# Patient Record
Sex: Female | Born: 1955 | Hispanic: Yes | Marital: Married | Smoking: Never smoker
Health system: Southern US, Community
[De-identification: ages and names within clinical notes are randomized; demographics above are authoritative.]

## PROBLEM LIST (undated history)

## (undated) DIAGNOSIS — J383 Other diseases of vocal cords: Secondary | ICD-10-CM

## (undated) DIAGNOSIS — R1310 Dysphagia, unspecified: Secondary | ICD-10-CM

## (undated) DIAGNOSIS — K5900 Constipation, unspecified: Secondary | ICD-10-CM

## (undated) DIAGNOSIS — T7840XA Allergy, unspecified, initial encounter: Secondary | ICD-10-CM

## (undated) DIAGNOSIS — S069XAA Unspecified intracranial injury with loss of consciousness status unknown, initial encounter: Secondary | ICD-10-CM

## (undated) DIAGNOSIS — S069X9A Unspecified intracranial injury with loss of consciousness of unspecified duration, initial encounter: Secondary | ICD-10-CM

## (undated) DIAGNOSIS — E079 Disorder of thyroid, unspecified: Secondary | ICD-10-CM

## (undated) DIAGNOSIS — E7800 Pure hypercholesterolemia, unspecified: Secondary | ICD-10-CM

## (undated) DIAGNOSIS — K219 Gastro-esophageal reflux disease without esophagitis: Secondary | ICD-10-CM

## (undated) DIAGNOSIS — I639 Cerebral infarction, unspecified: Secondary | ICD-10-CM

## (undated) DIAGNOSIS — R569 Unspecified convulsions: Secondary | ICD-10-CM

## (undated) DIAGNOSIS — H409 Unspecified glaucoma: Secondary | ICD-10-CM

## (undated) DIAGNOSIS — F419 Anxiety disorder, unspecified: Secondary | ICD-10-CM

## (undated) DIAGNOSIS — I10 Essential (primary) hypertension: Secondary | ICD-10-CM

## (undated) HISTORY — PX: REMOVAL OF GASTROSTOMY TUBE: SHX6058

## (undated) HISTORY — DX: Gastro-esophageal reflux disease without esophagitis: K21.9

## (undated) HISTORY — DX: Unspecified glaucoma: H40.9

## (undated) HISTORY — DX: Disorder of thyroid, unspecified: E07.9

## (undated) HISTORY — DX: Constipation, unspecified: K59.00

## (undated) HISTORY — DX: Unspecified convulsions: R56.9

## (undated) HISTORY — DX: Anxiety disorder, unspecified: F41.9

## (undated) HISTORY — DX: Allergy, unspecified, initial encounter: T78.40XA

## (undated) HISTORY — PX: GASTROSTOMY TUBE PLACEMENT: SHX655

---

## 2012-05-24 ENCOUNTER — Emergency Department (HOSPITAL_COMMUNITY)
Admission: EM | Admit: 2012-05-24 | Discharge: 2012-05-24 | Disposition: A | Discharge status: Home / Self Care | Payer: Self-pay | Attending: Emergency Medicine | Admitting: Emergency Medicine

## 2012-05-24 ENCOUNTER — Encounter (HOSPITAL_COMMUNITY): Payer: Self-pay | Admitting: Emergency Medicine

## 2012-05-24 DIAGNOSIS — E7800 Pure hypercholesterolemia, unspecified: Secondary | ICD-10-CM | POA: Insufficient documentation

## 2012-05-24 DIAGNOSIS — I10 Essential (primary) hypertension: Secondary | ICD-10-CM | POA: Insufficient documentation

## 2012-05-24 DIAGNOSIS — F419 Anxiety disorder, unspecified: Secondary | ICD-10-CM

## 2012-05-24 DIAGNOSIS — F411 Generalized anxiety disorder: Secondary | ICD-10-CM | POA: Insufficient documentation

## 2012-05-24 DIAGNOSIS — E119 Type 2 diabetes mellitus without complications: Secondary | ICD-10-CM | POA: Insufficient documentation

## 2012-05-24 HISTORY — DX: Pure hypercholesterolemia, unspecified: E78.00

## 2012-05-24 HISTORY — DX: Essential (primary) hypertension: I10

## 2012-05-24 MED ORDER — CLONAZEPAM 0.5 MG PO TABS: 0.5000 mg | ORAL_TABLET | Freq: Two times a day (BID) | ORAL | Status: DC | PRN

## 2012-05-24 MED ORDER — LORAZEPAM 1 MG PO TABS
1.0000 mg | ORAL_TABLET | Freq: Once | ORAL | Status: AC
  Administered 2012-05-24: 1 mg via ORAL
  Filled 2012-05-24: qty 2

## 2012-05-24 MED ORDER — LORAZEPAM 2 MG/ML IJ SOLN: 1.0000 mg | Freq: Once | INTRAMUSCULAR | Status: DC

## 2012-05-24 NOTE — ED Provider Notes (Signed)
History     CSN: [id number]  Arrival date & time 05/24/12  2011   First MD Initiated Contact with Patient 05/24/12 2028      Chief Complaint  Patient presents with  . Panic Attack    (Consider location/radiation/quality/duration/timing/severity/associated sxs/prior treatment) HPI Comments: Patient with a history of hypertension, diabetes mellitus, and hyperlipidemia presents emergency department with a chief complaint of anxiety attacks.  She states that she has been diagnosed back in [place] with panic and anxiety attacks but has not been followed up here in regards to this diagnosis.  She states that she has been experiencing increasing episodes over the last 3 months and describes them as lasting less than 1 minute, associated with heart palpitations, sweating palms, dizziness, and temporary shortness of breath.  She had an episode early today that lasted about 15 seconds.  Patient denies any chest pain, diaphoresis, syncope, difficulty ambulating including ataxia or disequilibrium, family history of heart attacks, persistent shortness of breath, exertional symptoms, fever, night sweats, chills, cough, hemoptysis.  The history is provided by the patient. The history is limited by a language barrier. A language interpreter was used Publishing rights manager).    Past Medical History  Diagnosis Date  . Hypertension   . Diabetes mellitus   . Hypercholesteremia     No past surgical history on file.  No family history on file.  History  Substance Use Topics  . Smoking status: Not on file  . Smokeless tobacco: Not on file  . Alcohol Use:     OB History    Grav Para Term Preterm Abortions TAB SAB Ect Mult Living                  Review of Systems  All other systems reviewed and are negative.    Allergies  Review of patient's allergies indicates no known allergies.  Home Medications   Current Outpatient Rx  Name Route Sig Dispense Refill  . ENALAPRIL MALEATE 10 MG PO TABS Oral  Take 10 mg by mouth daily.      BP 137/70  Pulse 79  Temp 98.3 F (36.8 C) (Oral)  Resp 18  SpO2 97%  Physical Exam  Nursing note and vitals reviewed. Constitutional: She appears well-developed and well-nourished. No distress.  HENT:  Head: Normocephalic and atraumatic.  Eyes: Conjunctivae and EOM are normal. Pupils are equal, round, and reactive to light.  Neck: Normal range of motion. Neck supple. Normal carotid pulses and no JVD present. Carotid bruit is not present. No rigidity. Normal range of motion present.  Cardiovascular: Normal rate, regular rhythm, S1 normal, S2 normal, normal heart sounds, intact distal pulses and normal pulses.  Exam reveals no gallop and no friction rub.   No murmur heard.      No pitting edema bilaterally, RRR, no aberrant sounds on auscultations, distal pulses intact, no carotid bruit or JVD.   Pulmonary/Chest: Effort normal and breath sounds normal. No accessory muscle usage or stridor. No respiratory distress. She exhibits no tenderness and no bony tenderness.  Abdominal: Bowel sounds are normal.       Soft non tender. Non pulsatile aorta.   Skin: Skin is warm, dry and intact. No rash noted. She is not diaphoretic. No cyanosis. Nails show no clubbing.    ED Course  Procedures (including critical care time)  Labs Reviewed - No data to display No results found.   No diagnosis found.   Date: 05/24/2012  Rate: 79  Rhythm: normal sinus rhythm  QRS Axis: normal  Intervals: normal  ST/T Wave abnormalities: normal  Conduction Disutrbances: none  Narrative Interpretation:   Old EKG Reviewed: No significant changes noted   MDM  Anxiety  Patient presentation consistent with anxiety attack.  Patient denies having any chest pain, exertional symptoms, EKG reviewed and normal, vital signs normal as well. Discussed case with Dr.Wentz, that agrees her presentation is not concerning for cardiac etiology and further workup is not indicated at this  time.  Patient will be discharged for short course of benzodiazepine and recommendation to followup with psychiatry.  Due to patient's comorbidities it is also recommended that she follow up and establish a relationship with her primary care physician.  She verbalizes understanding and states she will do this.        Jaci Carrel, [place] 05/24/12 2120

## 2012-05-24 NOTE — ED Notes (Addendum)
SOB reported for two months intermittent. She believes it to be anxiety because she feels nervous before it occurs. She reports the episodes only last for a minute. She says sometimes she has them daily and sometimes she will go several days without it occuring. Had panic attack today so wanted to come it. She says she gets more anxious due to fact she feels like she cannot breathe. She says MD in [place] dx her with anxiety but wanted to come in just to be sure. Denies chest pain.

## 2012-05-26 NOTE — ED Provider Notes (Signed)
Medical screening examination/treatment/procedure(s) were performed by non-physician practitioner and as supervising physician I was immediately available for consultation/collaboration.  Flint Melter, MD 05/26/12 [phone]

## 2014-02-16 ENCOUNTER — Ambulatory Visit (INDEPENDENT_AMBULATORY_CARE_PROVIDER_SITE_OTHER): Payer: Medicaid Other | Admitting: Family Medicine

## 2014-02-16 ENCOUNTER — Encounter: Payer: Self-pay | Admitting: Family Medicine

## 2014-02-16 VITALS — BP 125/80 | HR 92 | Temp 98.3°F | Ht 61.61 in | Wt 134.0 lb

## 2014-02-16 DIAGNOSIS — E785 Hyperlipidemia, unspecified: Secondary | ICD-10-CM | POA: Insufficient documentation

## 2014-02-16 DIAGNOSIS — I69920 Aphasia following unspecified cerebrovascular disease: Secondary | ICD-10-CM

## 2014-02-16 DIAGNOSIS — IMO0002 Reserved for concepts with insufficient information to code with codable children: Secondary | ICD-10-CM

## 2014-02-16 DIAGNOSIS — G839 Paralytic syndrome, unspecified: Secondary | ICD-10-CM | POA: Insufficient documentation

## 2014-02-16 DIAGNOSIS — Z931 Gastrostomy status: Secondary | ICD-10-CM

## 2014-02-16 DIAGNOSIS — I619 Nontraumatic intracerebral hemorrhage, unspecified: Secondary | ICD-10-CM | POA: Insufficient documentation

## 2014-02-16 DIAGNOSIS — S069X9A Unspecified intracranial injury with loss of consciousness of unspecified duration, initial encounter: Secondary | ICD-10-CM

## 2014-02-16 DIAGNOSIS — I10 Essential (primary) hypertension: Secondary | ICD-10-CM | POA: Insufficient documentation

## 2014-02-16 DIAGNOSIS — I69320 Aphasia following cerebral infarction: Secondary | ICD-10-CM | POA: Insufficient documentation

## 2014-02-16 DIAGNOSIS — E119 Type 2 diabetes mellitus without complications: Secondary | ICD-10-CM | POA: Insufficient documentation

## 2014-02-16 LAB — LIPID PANEL
CHOL/HDL RATIO: 4.7 ratio
Cholesterol: 207 mg/dL — ABNORMAL HIGH (ref 0–200)
HDL: 44 mg/dL (ref >39)
TRIGLYCERIDES: 424 mg/dL — AB (ref <150)

## 2014-02-16 LAB — BASIC METABOLIC PANEL
BUN: 12 mg/dL (ref 6–23)
CHLORIDE: 102 meq/L (ref 96–112)
CO2: 28 meq/L (ref 19–32)
CREATININE: 0.54 mg/dL (ref 0.50–1.10)
Calcium: 9.6 mg/dL (ref 8.4–10.5)
Glucose, Bld: 80 mg/dL (ref 70–99)
POTASSIUM: 4.3 meq/L (ref 3.5–5.3)
SODIUM: 139 meq/L (ref 135–145)

## 2014-02-16 LAB — POCT GLYCOSYLATED HEMOGLOBIN (HGB A1C): HEMOGLOBIN A1C: 6.2

## 2014-02-16 NOTE — Patient Instructions (Signed)
Muchas gracias por venir hoy.   Vamos a hacer todas las referencias y llamarles con las citas.  Por favor, hace otra cita conmigo en dos o tres semanas.  Mucho gusto de conocerles.

## 2014-02-16 NOTE — Progress Notes (Signed)
   Subjective:    Patient ID: Amy [name], female    DOB: 1956-01-25, 58 y.o.   MRN: [id number]  HPI Amy [name] presents to establish care.   She has a past medical history of diabetes, hypertension and hyperlipidemia.  Several month ago she fell while visiting family in [place]. 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 She was in a coma requiring mechanical ventilation for several months and her doctors did not think she would survive or regain any function. She was g-tube fed. With aggressive PT and SLP she regained some function and can now move her left arm and eat normally but remains intolerant to liquids. The patient is non-verbal except when startled but communicates somewhat through gestures. The family also reports memory loss and frequent episodes of crying when interacting with healthcare professionals but reports that she is generally happy at home.  Her family decided that she would be better served returning to [place] and need to establish care for her ongoing medical problems. Her craniotomy site remains unprotected because the bone piece was out too long to be replaced.   She comes to me on only Keppra and Glyburide. The family reports the Keppra was for secondary stroke prevention and deny any history of seizure. Presumably there was some seizure activity following her brain injury for which this was prescribed.  They are requesting referrals to establish care with the following services.  Neurology - management of TBI and AED Neurosurgery - management/follow-up of craniotomy General surgery - management of g-tube Physical therapy Speech therapy  The visit was conducted in [place] with occasional assistance from an interpreter who was present throughout.  Review of Systems  Neurological: Positive for speech difficulty and weakness. Negative for seizures.  Psychiatric/Behavioral: Negative for dysphoric mood and agitation.  All other  systems reviewed and are negative.      Objective:   Physical Exam  Nursing note and vitals reviewed. Constitutional: She appears well-developed and well-nourished. No distress.  HENT:  Head: Normocephalic and atraumatic.  Eyes: Conjunctivae are normal. Right eye exhibits no discharge. Left eye exhibits no discharge. No scleral icterus.  Cardiovascular: Normal rate, regular rhythm, normal heart sounds and intact distal pulses.   No murmur heard. Pulmonary/Chest: Effort normal and breath sounds normal. No respiratory distress. She has no wheezes.  Abdominal: Soft. Bowel sounds are normal. She exhibits no distension and no mass. There is no tenderness. There is no rebound and no guarding.  g-tube in place. Site appears healthy with no surrounding erythema or leakage.  Neurological: She is alert. She displays atrophy. She exhibits abnormal muscle tone.  Patient displays good strength in the left arm but no spontaneous movement of other extremities. She does not speak or respond to commands but does point with her left hand and engage in some non-verbal communication with family members.   Skin: Skin is warm and dry. No rash noted. She is not diaphoretic.  Psychiatric: Her affect is blunt and labile. She is slowed and withdrawn. Cognition and memory are impaired. She is noncommunicative.  She has a somewhat flat affect during most of the exam but appears to become frustrated at one point and cries for several minutes. The family reports this happens rarely and only when interacting with new people.          Assessment & Plan:

## 2014-02-16 NOTE — Assessment & Plan Note (Signed)
Not on any therapy at this time, no lipids on record - check lipid panel today

## 2014-02-16 NOTE — Assessment & Plan Note (Signed)
A1c 6.2, family report home CBGs <100 - continue glyburide 2.5 for now - consider trial off hypoglycemic if remains low

## 2014-02-16 NOTE — Assessment & Plan Note (Signed)
BP well controlled today off antihypertensive therapy - monitor - check BMET

## 2014-02-16 NOTE — Assessment & Plan Note (Signed)
This is an extremely complex patient who will require the coordination of multiple specialists. We will obtain records from [place] [facility] in [place] where she was hospitalized. Release obtained today. Follow-up in 2 weeks. No medication changes at this time. The following referrals were made today.  Neurology - management of TBI and AED  Neurosurgery - management/follow-up of craniotomy  General surgery - management of g-tube  Home health - PT, SLP, nutrition

## 2014-02-19 ENCOUNTER — Encounter (HOSPITAL_COMMUNITY): Payer: Self-pay | Admitting: Emergency Medicine

## 2014-02-19 ENCOUNTER — Other Ambulatory Visit: Payer: Self-pay | Admitting: Family Medicine

## 2014-02-19 DIAGNOSIS — S069X9A Unspecified intracranial injury with loss of consciousness of unspecified duration, initial encounter: Secondary | ICD-10-CM

## 2014-02-19 DIAGNOSIS — I10 Essential (primary) hypertension: Secondary | ICD-10-CM | POA: Insufficient documentation

## 2014-02-19 DIAGNOSIS — K5900 Constipation, unspecified: Secondary | ICD-10-CM | POA: Insufficient documentation

## 2014-02-19 DIAGNOSIS — IMO0002 Reserved for concepts with insufficient information to code with codable children: Secondary | ICD-10-CM

## 2014-02-19 DIAGNOSIS — Z79899 Other long term (current) drug therapy: Secondary | ICD-10-CM | POA: Insufficient documentation

## 2014-02-19 DIAGNOSIS — Z8673 Personal history of transient ischemic attack (TIA), and cerebral infarction without residual deficits: Secondary | ICD-10-CM | POA: Insufficient documentation

## 2014-02-19 DIAGNOSIS — E119 Type 2 diabetes mellitus without complications: Secondary | ICD-10-CM | POA: Insufficient documentation

## 2014-02-19 DIAGNOSIS — S069XAA Unspecified intracranial injury with loss of consciousness status unknown, initial encounter: Secondary | ICD-10-CM

## 2014-02-19 NOTE — ED Notes (Signed)
Interpreter used for triage, pt presents with pain around her feeding tube and on the inside of her abd at the feeding tube, pt able to receive foods and medicine. They are also wanting to know if she can have the feeding tube removed, pt and family informed by this RN that the Dr. Parks Ranger placed the tube is normally the Dr. Parks Ranger removed it. Pt states the feeding tube was placed in [place] and they have an appointment with a local Dr Tuesday but pt report increasing pain tonight.

## 2014-02-20 ENCOUNTER — Emergency Department (HOSPITAL_COMMUNITY)
Admission: EM | Admit: 2014-02-20 | Discharge: 2014-02-20 | Disposition: A | Discharge status: Home / Self Care | Payer: Medicaid Other | Attending: Emergency Medicine | Admitting: Emergency Medicine

## 2014-02-20 ENCOUNTER — Emergency Department (HOSPITAL_COMMUNITY): Payer: Medicaid Other

## 2014-02-20 DIAGNOSIS — K5900 Constipation, unspecified: Secondary | ICD-10-CM

## 2014-02-20 HISTORY — DX: Dysphagia, unspecified: R13.10

## 2014-02-20 HISTORY — DX: Cerebral infarction, unspecified: I63.9

## 2014-02-20 MED ORDER — MAGNESIUM CITRATE PO SOLN: 1.0000 | Freq: Once | ORAL | Status: DC

## 2014-02-20 NOTE — ED Provider Notes (Addendum)
CSN: [id number]     Arrival date & time 02/19/14  1959 History   First MD Initiated Contact with Patient 02/20/14 0135     Chief Complaint  Patient presents with  . Abdominal Pain     (Consider location/radiation/quality/duration/timing/severity/associated sxs/prior Treatment) HPI Comments: Patient with a G-tube as result of dysphagia from his multiple strokes has been complaining for the past 2, days, that she's having pain at the G-tube site.  There is no reported discharge redness at the, area.  Her son and daughter-in-law, who are primary caregivers are at the bedside.  They report that she has been eating solid and liquid foods by mouth and not using the G-tube for several weeks.  They would like the G-tube removed.  If possible.  Patient is a 58 y.o. female presenting with abdominal pain. The history is provided by the patient and a caregiver. The history is limited by a language barrier. No language interpreter was used.  Abdominal Pain Pain location: G-tube site. Pain quality: pressure   Pain radiates to:  Does not radiate Pain severity:  Mild Onset quality:  Gradual Duration:  3 days Timing:  Intermittent Progression:  Unchanged Chronicity:  New Relieved by:  None tried Worsened by:  Nothing tried Ineffective treatments:  None tried Associated symptoms: no constipation, no diarrhea, no dysuria, no fever and no nausea     Past Medical History  Diagnosis Date  . Hypertension   . Diabetes mellitus   . Hypercholesteremia   . GERD (gastroesophageal reflux disease)   . Thyroid disease   . Stroke   . Dysphagia    History reviewed. No pertinent past surgical history. Family History  Problem Relation Age of Onset  . Heart Problems Father    History  Substance Use Topics  . Smoking status: Never Smoker   . Smokeless tobacco: Never Used  . Alcohol Use: No   OB History   Grav Para Term Preterm Abortions TAB SAB Ect Mult Living                 Review of Systems   Constitutional: Negative for fever.  Gastrointestinal: Positive for abdominal pain. Negative for nausea, diarrhea and constipation.  Genitourinary: Negative for dysuria.  Neurological: Negative for dizziness and headaches.  All other systems reviewed and are negative.     Allergies  Shrimp and Phenytoin  Home Medications   Prior to Admission medications   Medication Sig Start Date End Date Taking? Authorizing Provider  glyBURIDE (DIABETA) 2.5 MG tablet Place 1.25 mg into feeding tube daily with breakfast.    Yes Historical Provider, MD  levETIRAcetam (KEPPRA) 500 MG tablet Take 500 mg by mouth 2 (two) times daily.   Yes Historical Provider, MD  Nutritional Supplements (GLUCERNA 1.5 CAL) LIQD Take 1 Can by mouth 3 (three) times daily with meals.   Yes Historical Provider, MD  magnesium citrate SOLN Take 296 mLs (1 Bottle total) by mouth once. 02/20/14   Garald Balding, NP   BP 105/87  Pulse 97  Temp(Src) 97.8 F (36.6 C) (Oral)  Resp 16  Ht 5\' 4"  (1.626 m)  Wt 130 lb (58.968 kg)  BMI 22.30 kg/m2  SpO2 97% Physical Exam  Constitutional: She appears well-developed.  HENT:  Head: Normocephalic.  Eyes: Pupils are equal, round, and reactive to light.  Neck: Normal range of motion.  Cardiovascular: Normal rate.   Abdominal: Soft. She exhibits no distension. There is tenderness.    Slight redness around tube site  ED Course  Procedures (including critical care time) Labs Review Labs Reviewed - No data to display  Imaging Review No results found.   EKG Interpretation None      MDM  KUB shows that she is, constipated.  I've prescribed magnesium sulfate to help relieve this.  She does have an appointment with a new primary care physician.  Next week, that they can discuss having the tube removed.  Her son also, reports, that she has gained a significant amount of weight since it was placed.  Do, to her increased appetite, and feels, that maybe the tube is pulling  slightly.  There is no sign of infection at the skin, site.  She has positive bowel sounds.  She does not appear to be in any distress Final diagnoses:  Constipation         Garald Balding, NP 02/20/14 0253  Garald Balding, NP 02/26/14 2248  Garald Balding, NP 03/04/14 [phone]

## 2014-02-20 NOTE — ED Provider Notes (Signed)
Medical screening examination/treatment/procedure(s) were performed by non-physician practitioner and as supervising physician I was immediately available for consultation/collaboration.   Kolt Mcwhirter, MD 02/20/14 0723 

## 2014-02-20 NOTE — Discharge Instructions (Signed)
Constipacin (Constipation) Se llama constipacin cuando:   Elimina heces (mueve el intestino) menos de 3 veces por semana.  Tiene dificultad para mover el intestino.  Las heces son secas y duras o son ms grandes que lo normal. CUIDADOS EN EL HOGAR   Consuma ms fibra que se encuentra en frutas, verduras y granos enteros como arroz integral y frijoles.  Consuma menos alimentos ricos en grasas y azcar. Estos incluyen patatas fritas, hamburguesas, galletas, dulces y refrescos.  Si no consume suficientes alimentos ricos en fibras, tome productos que tengan agregado de fibra (suplementos).  Beba gran cantidad de lquido para mantener el pis (orina) de tono claro o amarillo plido.  Vaya al bao cuando sienta la necesidad de ir. No espere.  Slo debe tomar los [employer] se los han recetado. No tome medicamentos que le ayuden a Film/video editor intestino (laxantes) sin antes consultarlo con su mdico.  Haga ejercicio en forma regular, o como lo indique su mdico. SOLICITE AYUDA DE INMEDIATO SI:   Observa sangre brillante en las heces (materia fecal).  El estreimiento dura ms de 4 das o [place].  Siente dolor en el vientre (abdomen) o en el ano (recto).  Las heces son delgadas (como un lpiz).  Pierde peso de Levasy inexplicable. ASEGRESE DE QUE:   Comprende estas instrucciones.  Controlar su enfermedad.  Solicitar ayuda de inmediato si no mejora o si empeora. Document Released: 11/03/2010 Document Revised: 12/24/2011 [facility] Patient Information 2014 [employer] [place], [place]. Keep your appointment with the doctor as scheduled

## 2014-02-24 ENCOUNTER — Encounter: Payer: Self-pay | Admitting: Neurology

## 2014-02-24 ENCOUNTER — Ambulatory Visit (INDEPENDENT_AMBULATORY_CARE_PROVIDER_SITE_OTHER): Payer: Medicaid Other | Admitting: Neurology

## 2014-02-24 VITALS — BP 125/77 | HR 78 | Ht 61.0 in | Wt 137.5 lb

## 2014-02-24 DIAGNOSIS — R569 Unspecified convulsions: Secondary | ICD-10-CM

## 2014-02-24 MED ORDER — LEVETIRACETAM NICU ORAL SYRINGE 100 MG/ML: ORAL | Status: DC

## 2014-02-24 NOTE — Progress Notes (Signed)
PATIENT: Amy [name] DOB: 12/07/1955  HISTORICAL  Amy [name] is a 58 yo [place] Female, is accompanied by her husband, daughter-in-law, interpreter  at today's clinical visit.  Patient herself, and her family, can only speaks Spanish, the history is through interpreter  She had past medical history of hypertension, diabetes, hyperlipidemia, depression, suffered a stroke in October 2014, presented to his acute onset of language difficulty, expressive more than comprehensive, right hemiparesis, arm more than leg, she was treated at medical clinic in [facility], she also had left craniotomy to control the swelling on the left side of her brain  She had suffered seizure during her hospital stay, first seizure was in October, second one in December 2014, she was taking Dilantin, she has developed diffuse body rash, now is taking Levetiractam., 500 mg one tablet twice a day, she has no recurrent seizure.  She apparently has very difficult hospital course, was intubated for one month, also had PEG tube placement, still receiving thin liquid through PEG tube, also had a tracheostomy,   She just recently moved back from [place] to [place], and she can sit in the wheelchair, ambulating with mild right leg weakness, mild gait difficulty, she can feel herself with her left hand, still has profound language difficulty, right hand weakness, there was no recurrent seizure  Family was not able to set up the medical record account from [place], I was not able to review with the outside medical record   REVIEW OF SYSTEMS: Full 14 system review of systems performed and notable only for weight gain, fatigue, chest pain, palpitation, hearing loss, blurry vision, loss of vision, cough, snoring, anemia, easy bruising, easy bleeding, feeling cold, joint pain, joint swelling,   ALLERGIES: Allergies  Allergen Reactions  . Shrimp [Shellfish Allergy] Anaphylaxis  . Phenytoin Hives and Itching     HOME MEDICATIONS: Current Outpatient Prescriptions on File Prior to Visit  Medication Sig Dispense Refill  . glyBURIDE (DIABETA) 2.5 MG tablet Place 1.25 mg into feeding tube daily with breakfast.       . levETIRAcetam (KEPPRA) 500 MG tablet Take 500 mg by mouth 2 (two) times daily.      . magnesium citrate SOLN Take 296 mLs (1 Bottle total) by mouth once.  195 mL  0  . Nutritional Supplements (GLUCERNA 1.5 CAL) LIQD Take 1 Can by mouth 3 (three) times daily with meals.       No current facility-administered medications on file prior to visit.    PAST MEDICAL HISTORY: Past Medical History  Diagnosis Date  . Hypertension   . Diabetes mellitus   . Hypercholesteremia   . GERD (gastroesophageal reflux disease)   . Thyroid disease   . Stroke   . Dysphagia     PAST SURGICAL HISTORY: No past surgical history on file.  FAMILY HISTORY: Family History  Problem Relation Age of Onset  . Heart Problems Father     SOCIAL HISTORY:  History   Social History  . Marital Status: Married    Spouse Name: N/A    Number of Children: 80  . Years of Education: N/A   Occupational History  . Not on file.   Social History Main Topics  . Smoking status: Never Smoker   . Smokeless tobacco: Never Used  . Alcohol Use: No  . Drug Use: No  . Sexual Activity: Not on file   Other Topics Concern  . Not on file   Social History Narrative   Patient  lives at home with her husband and daughter in [place].           PHYSICAL EXAM   Filed Vitals:   02/24/14 0807  BP: 125/77  Pulse: 78  Height: 5\' 1"  (1.549 m)  Weight: 137 lb 8 oz (62.37 kg)    Not recorded    Body mass index is 25.99 kg/(m^2).   Generalized: In no acute distress  Neck: Supple, no carotid bruits   Cardiac: Regular rate rhythm  Pulmonary: Clear to auscultation bilaterally  Musculoskeletal: No deformity  Neurological examination  Mentation: language barrier, follow commands,   Cranial nerve II-XII:  Pupils were equal round reactive to light. Extraocular movements were full.  there was no significant visual field deficit on visual threat  testing.  Bilateral fundi were sharp.   right lower face weakness, . Hearing was intact to finger rubbing bilaterally. Uvula tongue midline.  Head turning and shoulder shrug and were normal and symmetric.Tongue protrusion into cheek strength was normal.  Motor: Spastic right hemiparesis, right upper extremity proximal, and distal strength 2 out of 5, with passive movement, she has full range of motion of her right elbow, wrist, fingers, only slight right lower chunky weakness, 5 minus,   Sensory: Intact to fine touch, pinprick, preserved vibratory sensation, and proprioception at toes.  Coordination: Normal finger to nose, heel-to-shin bilaterally there was no truncal ataxia  Gait: Rising up from seated position without assistance, Mild right-sided weakness,   Romberg signs: Negative  Deep tendon reflexes:  hyperreflexia of the right upper and lower extremity,  plantar responses were flexor bilaterally.   DIAGNOSTIC DATA (LABS, IMAGING, TESTING) - I reviewed patient records, labs, notes, testing and imaging myself where available.  No results found for this basename: WBC, HGB, HCT, MCV, PLT      Component Value Date/Time   NA 139 02/16/2014 1459   K 4.3 02/16/2014 1459   CL 102 02/16/2014 1459   CO2 28 02/16/2014 1459   GLUCOSE 80 02/16/2014 1459   BUN 12 02/16/2014 1459   CREATININE 0.54 02/16/2014 1459   CALCIUM 9.6 02/16/2014 1459   Lab Results  Component Value Date   CHOL 207* 02/16/2014   HDL 44 02/16/2014   LDLCALC NOT CALC 02/16/2014   TRIG 424* 02/16/2014   CHOLHDL 4.7 02/16/2014   Lab Results  Component Value Date   HGBA1C 6.2 02/16/2014   ASSESSMENT AND PLAN  Amy [name] is a 58 y.o. female With vascular risk factor of hypertension, hyperlipidemia, diabetes, presented with a left hemisphere stroke, with residual aphagia, spastic right hemiparesis,  she also had left craniotomy, suspicious for hemorrhagic stroke, she is only taking diabetic medication, and seizure medications levetiractam at this point,  1. Medical record from medical clinic at [place], family will registered their medical record account,we can review the history together at followup visit 2. change levetiractam to liquid form 3. return to clinic in one month with Lahoma Crocker, M.D. Ph.D.  [facility] [address], [place], [place] [address] [phone]

## 2014-02-26 ENCOUNTER — Encounter: Payer: Self-pay | Admitting: Neurology

## 2014-02-26 ENCOUNTER — Ambulatory Visit (INDEPENDENT_AMBULATORY_CARE_PROVIDER_SITE_OTHER): Payer: Medicaid Other | Admitting: Surgery

## 2014-02-28 NOTE — ED Provider Notes (Signed)
Medical screening examination/treatment/procedure(s) were performed by non-physician practitioner and as supervising physician I was immediately available for consultation/collaboration.   Delora Fuel, MD [phone]

## 2014-03-01 ENCOUNTER — Telehealth: Payer: Self-pay | Admitting: Family Medicine

## 2014-03-01 NOTE — Telephone Encounter (Signed)
Tye Maryland, speech therapist with [facility] calls requesting verbal orders for Speech therapy 1x a week for 2 weeks. Also, [facility] has no nutrition service at the moment. Please call Tye Maryland at [phone] with orders.

## 2014-03-01 NOTE — Telephone Encounter (Signed)
When I reviewed my schedule for 03/02/14 I noticed that Amy [name] was having blurred vision in the context of a previous stroke. With the help of Dalbert Mayotte MD (speaks spanish) I attempted to contact the patient however no one picked up the phone. A message was left stating that the patient should go to the ED immediately if she feels that she may be having another stroke.

## 2014-03-02 ENCOUNTER — Ambulatory Visit (INDEPENDENT_AMBULATORY_CARE_PROVIDER_SITE_OTHER): Payer: Medicaid Other | Admitting: Family Medicine

## 2014-03-02 ENCOUNTER — Encounter: Payer: Self-pay | Admitting: Family Medicine

## 2014-03-02 VITALS — BP 123/80 | HR 80 | Temp 98.0°F

## 2014-03-02 DIAGNOSIS — H539 Unspecified visual disturbance: Secondary | ICD-10-CM

## 2014-03-02 NOTE — Patient Instructions (Addendum)
You have been referred to Opthalmology. You have an appointment with Dr. Katy Fitch today. Please go directly to his office. [address] [place], [place] [address]

## 2014-03-02 NOTE — Assessment & Plan Note (Signed)
Patient has acute vision changes predominantly in the right eye without other focal neurologic symptoms. -Patient has been referred to ophthalmology for a formal eye exam and has been scheduled for an appointment later this morning

## 2014-03-02 NOTE — Progress Notes (Signed)
   Subjective:    Patient ID: Amy [name], female    DOB: Oct 18, 1955, 58 y.o.   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 She has residual right-sided hemiparesis as well as aphasia. She also has seizure disorder and is controlled on Keppra. She is accompanied today by her daughter-in-law and husband, a Spanish interpreter is present, her daughter-in-law reports that the patient had a headache 4 days ago, this is frontal in nature, the patient states it is very mild and resolved spontaneously, she has subsequently had blurry vision predominantly in the right eye, it is difficult for the patient to explain the vision change due to her aphasia, she is able to relate that it is substantial loss of vision, no associated dizziness, no new numbness or weakness, no associated eye pain or drainage, no rhinorrhea, no sore throat, no cough, no recent seizure activity   Review of Systems  Constitutional: Negative for fever, chills and fatigue.  HENT: Negative for congestion.   Eyes: Positive for visual disturbance. Negative for pain, discharge, redness and itching.  Respiratory: Negative for cough and shortness of breath.   Cardiovascular: Negative for chest pain and leg swelling.  Neurological: Positive for speech difficulty and headaches. Negative for dizziness, light-headedness and numbness.       Objective:   Physical Exam Vitals: Reviewed General: Pleasant Hispanic female, aphasic, interpreter present HEENT: Normocephalic, pupils are equal round and reactive to light, extraocular movements are intact, no scleral icterus, no conjunctival injection, funduscopic exam was attempted however was technically difficult due to to non dilated eye exam and patient moving her eyes, unable to fully visualize the fundus bilateral Cardiac: Regular rate and rhythm, S1 and S2 present, no murmurs, no heaves  or Respiratory: Clear to station bilaterally, normal effort Neuro: Patient is aphasic, she has residual right-sided hemiparesis, strength in left upper and left lower extremity is 5 over 5, patient has decreased sensation to light touch in the right upper and right lower extremity  Unable to perform Snellen eye exam due to patient aphasia     Assessment & Plan:  Please see problem specific assessment and plan.

## 2014-03-03 ENCOUNTER — Ambulatory Visit (INDEPENDENT_AMBULATORY_CARE_PROVIDER_SITE_OTHER): Payer: Medicaid Other | Admitting: General Surgery

## 2014-03-03 ENCOUNTER — Telehealth: Payer: Self-pay | Admitting: Family Medicine

## 2014-03-03 ENCOUNTER — Encounter (INDEPENDENT_AMBULATORY_CARE_PROVIDER_SITE_OTHER): Payer: Self-pay | Admitting: General Surgery

## 2014-03-03 VITALS — BP 136/80 | HR 90 | Temp 97.8°F | Ht 61.0 in | Wt 138.0 lb

## 2014-03-03 DIAGNOSIS — Z931 Gastrostomy status: Secondary | ICD-10-CM

## 2014-03-03 NOTE — Addendum Note (Signed)
Addended by: Ivor Costa on: 03/03/2014 03:10 PM   Modules accepted: Orders

## 2014-03-03 NOTE — Telephone Encounter (Signed)
Dr. Sherril Cong   NeuroSurgery denied to see pt. In Order for me to referral this pt I really need the medical records. I spoke with family member today to try to call and get some update from [place].  Daughter in law will call me back with the phone number.    [place]

## 2014-03-03 NOTE — Progress Notes (Signed)
Patient ID: Amy [name], female   DOB: 1956-09-04, 58 y.o.   MRN: [id number]  Chief Complaint  Patient presents with  . eval g-tube    HPI Amy [name] is a 58 y.o. female.  The patient is a 58 year old Spanish-speaking female who is referred by Dr. Sherril Cong for evaluation of the G-tube. According to the patient's Spanish speaking and daughter-in-law the patient had a hemorrhagic stroke while in [place] in October of 2014. Subsequent to this the patient had dysphagia and had a G-tube placed. The patient was then on vacation. The patient at that time underwent swallow study which revealed aspiration.  According to the patient's family she continues to gain weight. She is eating solid food however thin liquids to be aspirated. She recently went to the ER with constipation and abdominal pain. I believe in speaking with the family she has not had much water and the tube. This most likely  Lead to her constipation. HPI  Past Medical History  Diagnosis Date  . Hypertension   . Diabetes mellitus   . Hypercholesteremia   . GERD (gastroesophageal reflux disease)   . Thyroid disease   . Stroke   . Dysphagia     History reviewed. No pertinent past surgical history.  Family History  Problem Relation Age of Onset  . Heart Problems Father     Social History History  Substance Use Topics  . Smoking status: Never Smoker   . Smokeless tobacco: Never Used  . Alcohol Use: No    Allergies  Allergen Reactions  . Shrimp [Shellfish Allergy] Anaphylaxis  . Phenytoin Hives and Itching    Current Outpatient Prescriptions  Medication Sig Dispense Refill  . glyBURIDE (DIABETA) 2.5 MG tablet Place 1.25 mg into feeding tube daily with breakfast.       . levETIRAcetam (KEPPRA) 100 MG/ML SOLN 500mg =10ml twice a day  500 mL  12  . Nutritional Supplements (GLUCERNA 1.5 CAL) LIQD Take 1 Can by mouth 3 (three) times daily with meals.       No current facility-administered medications for this visit.     Review of Systems Review of Systems  Constitutional: Negative.   HENT: Negative.   Respiratory: Negative.   Cardiovascular: Negative.   Gastrointestinal: Negative.   Neurological: Negative.   All other systems reviewed and are negative.   Blood pressure 136/80, pulse 90, temperature 97.8 F (36.6 C), height 5\' 1"  (1.549 m), weight 138 lb (62.596 kg).  Physical Exam Physical Exam  Constitutional: She is oriented to person, place, and time. She appears well-developed and well-nourished.  HENT:  Head: Normocephalic and atraumatic.  Eyes: Conjunctivae and EOM are normal. Pupils are equal, round, and reactive to light.  Neck: Normal range of motion. Neck supple.  Cardiovascular: Normal rate, regular rhythm and normal heart sounds.   Pulmonary/Chest: Effort normal and breath sounds normal.  Abdominal: Soft. Bowel sounds are normal. She exhibits no distension and no mass. There is no tenderness. There is no rebound and no guarding.  Musculoskeletal: Normal range of motion.  Neurological: She is alert and oriented to person, place, and time.  Skin: Skin is warm and dry.  Psychiatric: She has a normal mood and affect.    Data Reviewed none  Assessment    58 year old female status post hemorrhagic stroke, dysphagia and gastric feeding tube     Plan    1. We will have the patient undergo a swallowing evaluation to evaluate whether or not the patient has continued to aspirate this  time. I recommend continue with by mouth as tolerated as well as continue with hydration needs with water per the feeding tube. 2. Once the evaluation results are finalized we will discuss with him whether or not the feeding tube can be removed. If the patient continues with aspiration she will need to feeding tube for hydration.        Ralene Ok 03/03/2014, 3:00 PM

## 2014-03-04 ENCOUNTER — Other Ambulatory Visit (INDEPENDENT_AMBULATORY_CARE_PROVIDER_SITE_OTHER): Payer: Self-pay | Admitting: *Deleted

## 2014-03-04 ENCOUNTER — Telehealth (INDEPENDENT_AMBULATORY_CARE_PROVIDER_SITE_OTHER): Payer: Self-pay | Admitting: *Deleted

## 2014-03-04 DIAGNOSIS — Z931 Gastrostomy status: Secondary | ICD-10-CM

## 2014-03-04 NOTE — ED Provider Notes (Signed)
Medical screening examination/treatment/procedure(s) were performed by non-physician practitioner and as supervising physician I was immediately available for consultation/collaboration.   Candas Deemer, MD 03/04/14 0741 

## 2014-03-04 NOTE — Telephone Encounter (Signed)
LM for pt to return my call.  Please advise pt of her appt for Barium Swallowing at [facility], check in [phone] at 1:45 on the 1st floor at Radiology.  Anderson Malta

## 2014-03-05 ENCOUNTER — Other Ambulatory Visit (HOSPITAL_COMMUNITY): Payer: Self-pay | Admitting: General Surgery

## 2014-03-05 DIAGNOSIS — R1310 Dysphagia, unspecified: Secondary | ICD-10-CM

## 2014-03-10 ENCOUNTER — Ambulatory Visit (HOSPITAL_COMMUNITY)
Admission: RE | Admit: 2014-03-10 | Discharge: 2014-03-10 | Disposition: A | Discharge status: Home / Self Care | Payer: Medicaid Other | Source: Ambulatory Visit | Attending: General Surgery | Admitting: General Surgery

## 2014-03-10 ENCOUNTER — Ambulatory Visit (INDEPENDENT_AMBULATORY_CARE_PROVIDER_SITE_OTHER): Payer: Medicaid Other | Admitting: Family Medicine

## 2014-03-10 ENCOUNTER — Encounter: Payer: Self-pay | Admitting: Family Medicine

## 2014-03-10 VITALS — BP 118/68 | HR 93 | Temp 98.1°F | Ht 61.0 in | Wt 138.5 lb

## 2014-03-10 DIAGNOSIS — R1310 Dysphagia, unspecified: Secondary | ICD-10-CM | POA: Insufficient documentation

## 2014-03-10 DIAGNOSIS — IMO0002 Reserved for concepts with insufficient information to code with codable children: Secondary | ICD-10-CM

## 2014-03-10 DIAGNOSIS — E119 Type 2 diabetes mellitus without complications: Secondary | ICD-10-CM

## 2014-03-10 DIAGNOSIS — S069X9A Unspecified intracranial injury with loss of consciousness of unspecified duration, initial encounter: Secondary | ICD-10-CM

## 2014-03-10 DIAGNOSIS — Z931 Gastrostomy status: Secondary | ICD-10-CM | POA: Insufficient documentation

## 2014-03-10 MED ORDER — GLYBURIDE 2.5 MG PO TABS: 1.2500 mg | ORAL_TABLET | Freq: Every day | ORAL | Status: DC

## 2014-03-10 NOTE — Procedures (Signed)
Objective Swallowing Evaluation: Modified Barium Swallowing Study  Patient Details  Name: Amy [name] MRN: [id number] Date of Birth: [birth year]  Today's Date: 03/10/2014 Time: 1410-1500 SLP Time Calculation (min): 50 min  Past Medical History:  Past Medical History  Diagnosis Date  . Hypertension   . Diabetes mellitus   . Hypercholesteremia   . GERD (gastroesophageal reflux disease)   . Thyroid disease   . Stroke   . Dysphagia    Past Surgical History: No past surgical history on file. HPI:  Amy [name] is a 58 y.o. Spanish-speaking female who is referred by Dr Rosendo Gros for swallow evaluation due to h/o dysphagia after CVA with pt requiring G-tube. According to the referring MD note, pt had a hemorrhagic stroke while in [place] in October of 2014. Subsequent to this the patient had dysphagia and had a G-tube placed. The patient has undergone 2 swallow studies per daughter in law which revealed aspiration requring feeding tube first time and 2nd time requiring thickened liquids.  Pt's family states pt is eating solid food and drinking thicker liquids however water causes pt to occasionally cough/choke.  She recently went to the ER with constipation and abdominal pain - Referring MD quesitoned if pt with inadequate intake of water via tube PMH also + for Diabetes mellitus, Hypercholesteremia, seizures in Dec 2014.    Daughter, son in law and interpreter present for MBS.      Assessment / Plan / Recommendation Clinical Impression  Dysphagia Diagnosis: Mild oral phase dysphagia;Mild pharyngeal phase dysphagia  Clinical impression: Mild oropharyngeal dysphagia with a single episode of SILENT trace aspiration with thin when pt was taking barium tablet.  Pt masticated barium tablet and subsequently held thin barium in mouth until it prematurely spilled into pharynx/larynx with resultant aspiration.  Pt did not have protective cough response to clear.    No aspiration/penetration noted with  thin, nectar, pudding, cracker.   Daughter in law reports pt to occasionally cough with thin water.  Recommend advance diet to regular/thin avoiding mixed consistencies with close monitoring for tolerance given silent nature of aspiration.  Using teach back, SLP reiterated effective compensation strategies.  SLP follow up via [facility] for dysphagia/aphasia treatment to maximize functional speech/swallow skills would be beneficial.  Thanks for this consult.      Treatment Recommendation  Defer treatment plan to SLP at (Comment) (recommend [facility])    Diet Recommendation Regular;Thin liquid  Avoid mixed consistencies  Liquid Administration via: Cup;Straw Medication Administration: Crushed with puree Supervision: Patient able to self feed Compensations: Slow rate;Small sips/bites;Multiple dry swallows after each bite/sip Postural Changes and/or Swallow Maneuvers: Seated upright 90 degrees;Upright 30-60 min after meal       Follow Up Recommendations    HH SLP     General Date of Onset: 03/10/14 HPI: [name] is a 58 y.o. Spanish-speaking female who is referred by Dr Rosendo Gros for swallow evaluation due to h/o dysphagia after CVA with pt requiring G-tube. According to the referring MD note, pt had a hemorrhagic stroke while in [place] in October of 2014. Subsequent to this the patient had dysphagia and had a G-tube placed. The patient has undergone 2 swallow studies per daughter in law which revealed aspiration requring feeding tube first time and 2nd time requiring thickened liquids.  Pt's family states pt is eating solid food and drinking thicker liquids however water causes pt to occasionally cough/choke.  She recently went to the ER with constipation and abdominal pain - Referring MD quesitoned if pt with  inadequate intake of water via tube PMH also + for Diabetes mellitus, Hypercholesteremia, seizures in Dec 2014.    Daughter, son in law and interpreter present for MBS.  Type of Study: Modified Barium  Swallowing Study Reason for Referral: Objectively evaluate swallowing function Diet Prior to this Study: PEG tube;Regular;Nectar-thick liquids Temperature Spikes Noted: No Respiratory Status: Room air History of Recent Intubation: No Behavior/Cognition: Alert Oral Cavity - Dentition:  (dentition present) Oral Motor / Sensory Function: Impaired motor Oral impairment: Right facial Self-Feeding Abilities: Able to feed self (using left hand due to right hemiparesis) Patient Positioning: Upright in chair Baseline Vocal Quality: Low vocal intensity Volitional Cough: Other (Comment) (apraxic from previous cva) Volitional Swallow: Unable to elicit Anatomy: Within functional limits Pharyngeal Secretions: Not observed secondary MBS    Reason for Referral Objectively evaluate swallowing function   Oral Phase Oral Preparation/Oral Phase Oral Phase: Impaired Oral - Nectar Oral - Nectar Teaspoon: Piecemeal swallowing Oral - Nectar Cup: Piecemeal swallowing Oral - Thin Oral - Thin Teaspoon: Piecemeal swallowing Oral - Thin Cup: Piecemeal swallowing Oral - Thin Straw: Piecemeal swallowing Oral - Solids Oral - Puree: Piecemeal swallowing Oral - Regular: Piecemeal swallowing Oral - Pill: Piecemeal swallowing (pt masticated barium tablet, premature spill of thin into pharynx/larynx due to oral holding) Oral Phase - Comment Oral Phase - Comment: trace oral residuals cleared with pt dry swallowing, piecemeal deglutition functional for pt   Pharyngeal Phase Pharyngeal Phase Pharyngeal Phase: Impaired Pharyngeal - Nectar Pharyngeal - Nectar Teaspoon: Within functional limits Pharyngeal - Nectar Cup: Within functional limits;Pharyngeal residue - valleculae (trace vallecular residual) Pharyngeal - Thin Pharyngeal - Thin Teaspoon: Within functional limits;Premature spillage to valleculae Pharyngeal - Thin Cup: Within functional limits;Penetration/Aspiration during swallow (pt masticated barium  tablet, premature spill of thin into pharynx/larynx due to oral holding) Penetration/Aspiration details (thin cup): Material enters airway, passes BELOW cords without attempt by patient to eject out (silent aspiration) (aspiration noted when pt attempted to consume thin with barium tablet) Pharyngeal - Thin Straw: Within functional limits Pharyngeal - Solids Pharyngeal - Puree: Within functional limits;Premature spillage to valleculae;Pharyngeal residue - valleculae (trace vallecular residuals) Pharyngeal - Regular: Within functional limits;Premature spillage to valleculae Pharyngeal - Pill: Within functional limits (masticated tablet cleared well )  Cervical Esophageal Phase    GO    Cervical Esophageal Phase Cervical Esophageal Phase: WFL (appearance of adequate clearance with sweep x3 - radiologist not present to confirm)    Functional Assessment Tool Used: mbs, clinical judgement Functional Limitations: Swallowing Swallow Current Status (J2878): At least 1 percent but less than 20 percent impaired, limited or restricted Swallow Goal Status [phone]): At least 1 percent but less than 20 percent impaired, limited or restricted Swallow Discharge Status [phone]): At least 1 percent but less than 20 percent impaired, limited or restricted    Luanna Salk, [facility] SLP [phone]

## 2014-03-10 NOTE — Patient Instructions (Signed)
Gracias por venir hoy.  He mandado otro receta para su glyburide a la farmacia.  Vamos a llamar para obtener su record para el neurologo.

## 2014-03-11 ENCOUNTER — Telehealth: Payer: Self-pay | Admitting: Clinical

## 2014-03-11 NOTE — Telephone Encounter (Signed)
CSW is trying to assist pt in obtaining syringes from [facility]. CSW has left a message for pt asking whether she has been to DSS to switch her Medicaid from [place] to [facility].  Hunt Oris, MSW, [place]

## 2014-03-11 NOTE — Assessment & Plan Note (Signed)
Well controlled at last visit - refilled glyburide 1.25 daily - may not even need this but will continue for now

## 2014-03-11 NOTE — Progress Notes (Signed)
   Subjective:    Patient ID: Amy [name], female    DOB: [birth year], 58 y.o.   MRN: [id number]  Diabetes Pertinent negatives for hypoglycemia include no dizziness or seizures. Pertinent negatives for diabetes include no chest pain.   Amy [name] is here for follow-up of TBI. She has reports doing well without acute change. Since our last visit she has neurology and general surgery and gotten a barium swallow study. She will likely be able to get her g-tube out soon. They have not been able to get home health therapy (PT, OT, speech, nutrition) but were not told why not, likely related to medicaid. They report no new complaints other than occasional constipation which they were seen in the ER for but which resolves with magnesium citrate.   Review of Systems  Constitutional: Negative for fever.  Respiratory: Negative for shortness of breath.   Cardiovascular: Negative for chest pain.  Gastrointestinal: Positive for constipation. Negative for abdominal pain.  Neurological: Negative for dizziness and seizures.  All other systems reviewed and are negative.      Objective:   Physical Exam  Nursing note and vitals reviewed. Constitutional: She is oriented to person, place, and time. She appears well-developed and well-nourished. No distress.  HENT:  Head: Normocephalic and atraumatic.  Eyes: Conjunctivae are normal. Right eye exhibits no discharge. Left eye exhibits no discharge. No scleral icterus.  Cardiovascular: Normal rate.   Pulmonary/Chest: Effort normal.  Abdominal: She exhibits no distension.  Neurological: She is alert and oriented to person, place, and time.  Patient displays good strength in the left arm but no spontaneous movement of other extremities. She does not speak or respond to commands but does point with her left hand and engage in some non-verbal communication with family members and with me.  Skin: Skin is warm and dry. She is not diaphoretic.  Psychiatric: She  has a normal mood and affect. Her behavior is normal.       Assessment & Plan:

## 2014-03-11 NOTE — Assessment & Plan Note (Signed)
Still working on getting records from [facility], obtained to release form today as well as numbers for specific caregivers there - neurosurgery refusing to see until records obtained - will continue to work on this - not approved for home health services because her medicaid in [place] is not active - Constance Holster working with the family on this issue - saw general surgery who will likely remove g-tube now that her swallow function has improved - barium swallow done last week - neurology appt scheduled for June 15th - follow up in 3 months, given Norma's card to facilitate getting above services

## 2014-03-12 ENCOUNTER — Telehealth: Payer: Self-pay | Admitting: Clinical

## 2014-03-12 NOTE — Telephone Encounter (Signed)
CSW contacted [facility] again to have them recheck pt Medicaid as CSW was informed by pt son that it is activated and that they have already been to [facility]. [facility] confirmed that it is active and they will send someone out for a PT eval tomorrow morning and speech therapy services hopefully next week (speech eval already done a few weeks ago.) I also resubmitted the order for an RN as pt is in need of syringes.   CSW provided the above information to both pt son Elita Quick and daughter in law Marcie Bal [phone].  CSW also made aware by Marcie Bal that pt was never living in [place] rather pt was visiting family in [place] when she had the stroke. Pt was in the ICU for some time prior to coming back home to [place]. Pt at this time is unable to speak and is having a very difficult time coping with this. CSW will continue to follow and provide additional support as needed.  CSW will follow up with [facility] on Monday to confirm services are scheduled.  Hunt Oris, MSW, [place]

## 2014-03-12 NOTE — Telephone Encounter (Signed)
Message copied by Hunt Oris on Fri Mar 12, 2014  4:15 PM ------      Message from: Frazier Richards      Created: Thu Mar 11, 2014  2:20 PM      Regarding: RE: Orders for [place]       I put the order in. Let me know if I need to do anything else.            Once they get her medicaid switched to [place] is it possible we may be able to get her PT and SLP after all?            ----- Message -----         From: Hunt Oris, LCSW         Sent: 03/11/2014   9:19 AM           To: Frazier Richards, MD      Subject: Orders for [facility] & Syringes                               Hi Dr. Sherril Cong,             I couldn't find you yesterday before I left so I asked someone to give you a note regarding this pt. However they ended up putting it in your box.            I spoke to several ppl yesterday and I was informed by [place] that the pt actually did not get services because the pt's Medicaid did not show it was active..I called [facility] and they said that they could not provide the pt with syringes because she was not there patient/they don't provide her with services!! So, finally I was told that if the pt gets an order for a [facility] RN then she should be able to get the syringes. We will give this a try & I will call the pt to confirm she's been to the [place] office in [facility] to switch her Medicaid to [facility].            ##Please place an order for RN and syringes##            Thanks, Hunt Oris, MSW, [place]             ------

## 2014-03-29 ENCOUNTER — Ambulatory Visit: Payer: Medicaid Other | Admitting: Nurse Practitioner

## 2014-03-31 ENCOUNTER — Ambulatory Visit (INDEPENDENT_AMBULATORY_CARE_PROVIDER_SITE_OTHER): Payer: Medicaid Other | Admitting: Nurse Practitioner

## 2014-03-31 ENCOUNTER — Encounter (INDEPENDENT_AMBULATORY_CARE_PROVIDER_SITE_OTHER): Payer: Self-pay

## 2014-03-31 ENCOUNTER — Encounter: Payer: Self-pay | Admitting: Nurse Practitioner

## 2014-03-31 VITALS — BP 120/76 | HR 91 | Wt 141.0 lb

## 2014-03-31 DIAGNOSIS — I69320 Aphasia following cerebral infarction: Secondary | ICD-10-CM

## 2014-03-31 DIAGNOSIS — R569 Unspecified convulsions: Secondary | ICD-10-CM

## 2014-03-31 DIAGNOSIS — I69920 Aphasia following unspecified cerebrovascular disease: Secondary | ICD-10-CM

## 2014-03-31 MED ORDER — LEVETIRACETAM 500 MG PO TABS: 500.0000 mg | ORAL_TABLET | Freq: Two times a day (BID) | ORAL | Status: DC

## 2014-03-31 NOTE — Patient Instructions (Signed)
Continue taking Leviteracetam at the same dose.  1 tablet in the morning and 1 tablet at night.  We will order an EEG study to see if there is seizure activity while on the medication.  Someone will call you to set up this appointment.  The test is done here in the office.  Follow up in 6 months, sooner as needed.    Electroencefalograma Building services engineer) El profesional que lo asiste le ha solicitado un EEG. Consiste en una prueba que registra la actividad elctrica (ondas cerebrales) del cerebro. Da informacin sobre el funcionamiento del cerebro. No puede leer los pensamientos, las ideas ni el conocimiento. Generalmente dura Leone Brand y media. PREPARACIN PARA LA PRUEBA  El cabello debe estar limpio y [place] anterior no use spray, aceites ni se ate el cabello.  [employer] medicamentos a menos que el profesional que lo asiste le haya indicado lo contrario.  Haga sus comidas habituales a menos que se le indique lo contrario.  Evite la cafena cuatro horas antes de la prueba.  Si el profesional que lo asiste le ha ordenado un "EEG con deprivacin del [name] que duerma menos o que permanezca despierto la noche anterior al procedimiento.  Podrn darle tambin otras instrucciones. MTODO UTILIZADO PARA REALIZAR LA PRUEBA Le pedirn que se siente en un asiento cmodo y Firefighter en la cabeza pequeos discos de metal denominados electrodos, con un [place] similar a la goma de Print production planner . Estos electrodos captan la actividad elctrica del cerebro y se amplifican en el electroencefalgrafo. Estos signos se imprimen en un papel. Puede haber una molestia mnima asociada con la colocacin de los electrodos. DURANTE LA PRUEBA SE LE PEDIR QUE:  Permanezca tranquilo y relajado.  Sherlon Handing y [employer] ojos.  Respire profundamente durante tres minutos.  Observe una luz que titila durante un breve perodo. DESPUS DEL ESTUDIO Es una prueba compleja que lleva algn tiempo  interpretar. No obtendr los [employer] de la prueba. Le retirarn los electrodos con una solucin solvente como la acetona o el [place]. Puede retomar sus actividades habituales. Un neurlogo (especialista en trastornos nerviosos) interpretar la prueba y enviar un informe al profesional que lo asiste. Averige de [employer]. Recuerde, es su responsabilidad retirar el resultado del [place]. No piense que el resultado es normal si esta informacin no se la brinda el profesional que lo asiste. Document Released: 10/01/2005 Document Revised: 12/24/2011 [facility] Patient Information 2014 [place], [place].

## 2014-03-31 NOTE — Progress Notes (Signed)
PATIENT: Amy [name] DOB: 21-Dec-1955  REASON FOR VISIT: routine follow up for seizure HISTORY FROM: daughter, patient with interpreter.  HISTORY OF PRESENT ILLNESS: Amy [name] is a 58 yo RH [place] [name], is accompanied by her daughter-in-law and interpreter at today's clinical visit.   UPDATE 03/31/14 (LL): Since last visit, she has been well.  She is able to swallow pills whole and is eating by mouth.  She still has g-tube in place.  Daughter reports no definite seizures but she has times during the day where she just stares and is non-attentive.  Patient denies pain. Family has not been able to get medical records from [place] which has been a barrier for care for her in some practices.    02/24/14 (YY): Patient herself, and her family, can only speaks Spanish, the history is through interpreter. She had past medical history of hypertension, diabetes, hyperlipidemia, depression, suffered a stroke in October 2014, presented to his acute onset of language difficulty, expressive more than comprehensive, right hemiparesis, arm more than leg, she was treated at medical clinic in [facility], she also had left craniotomy to control the swelling on the left side of her brain. She had suffered seizure during her hospital stay, first seizure was in October, second one in December 2014, she was taking Dilantin, she has developed diffuse body rash, now is taking Levetiractam., 500 mg one tablet twice a day, she has no recurrent seizure.  She apparently has very difficult hospital course, was intubated for one month, also had PEG tube placement, still receiving thin liquid through PEG tube, also had a tracheostomy.  She just recently moved back from [place] to [place], and she can sit in the wheelchair, ambulating with mild right leg weakness, mild gait difficulty, she can feed herself with her left hand, still has profound language difficulty, right hand monoparesis, there was no recurrent  seizure.  Family was not able to set up the medical record account from [place], I was not able to review with the outside medical record.   REVIEW OF SYSTEMS: Full 14 system review of systems performed and notable only for: No complaints.  ALLERGIES: Allergies  Allergen Reactions  . Shrimp [Shellfish Allergy] Anaphylaxis  . Phenytoin Hives and Itching    PHYSICAL EXAM  Filed Vitals:   03/31/14 1006  BP: 120/76  Pulse: 91  Weight: 141 lb (63.957 kg)   Body mass index is 26.66 kg/(m^2). No exam data present  Generalized: In no acute distress Neck: Supple, no carotid bruits  Cardiac: Regular rate rhythm  Pulmonary: Clear to auscultation bilaterally   Neurological examination  Mentation: language barrier, follow commands,  Cranial nerve II-XII: Pupils were equal round reactive to light. Extraocular movements were full. there was no significant visual field deficit on visual threat testing. Right lower face weakness.  Hearing was intact to finger rubbing bilaterally. Uvula tongue midline. Head turning normal. Shoulder shrug is decreased on the right side. Motor: Spastic right hemiparesis, right upper extremity proximal, and distal strength 2 out of 5, with passive movement, she has decreased range of motion of her right elbow. No movement of right wrist or fingers.  Only slight right lower extremity weakness, 5-/5. Sensory: Intact to fine touch, pinprick, preserved vibratory sensation. Coordination: Normal finger to nose, heel-to-shin are impaired on the right side. Gait: Rising up from seated position without assistance, Mild right-sided weakness  Romberg signs: Negative  Deep tendon reflexes: hyperreflexia of the right upper and lower extremity, plantar responses  were flexor bilaterally.   DIAGNOSTIC DATA (LABS, IMAGING, TESTING) - I reviewed patient records, labs, notes, testing and imaging myself where available.  Lab Results  Component Value Date   HGBA1C 6.2 02/16/2014     ASSESSMENT AND PLAN Amy [name] is a 58 y.o. [name] with vascular risk factor of hypertension, hyperlipidemia, diabetes, presented with a left hemisphere stroke, with residual aphagia, spastic right hemiparesis, she also had left craniotomy, suspicious for hemorrhagic stroke, she is only taking diabetic medication, and seizure medication levetiractam at this point.  She has not had seizure since being on LVT, as far as daughter-in-law knows, she is patient's caretaker.  She does have spells every day in which she stares and is non-attentive, suspicious for partial seizures. We will check EEG to evaluate for such.  1. Medical record from medical clinic at [place], family will request their medical record, we can review the history together at followup visit. 2. Continue levetiractam 500 mg bid. 3. Check EEG.  4. return to clinic in 6 months with Dr. Krista Blue  Orders Placed This Encounter  Procedures  . EEG adult   Meds ordered this encounter  Medications  . levETIRAcetam (KEPPRA) 500 MG tablet    Sig: Take 1 tablet (500 mg total) by mouth 2 (two) times daily.    Dispense:  180 tablet    Refill:  3    Order Specific Question:  Supervising Provider    Answer:  Andrey Spearman R [[id number]]   Rudi Rummage Nathaniel Wakeley, MSN, NP-C 03/31/2014, 10:42 AM [facility] [address] [place], [place] [address] [phone]  Note: This document was prepared with digital dictation and possible smart phrase technology. Any transcriptional errors that result from this process are unintentional.

## 2014-04-01 ENCOUNTER — Ambulatory Visit (INDEPENDENT_AMBULATORY_CARE_PROVIDER_SITE_OTHER): Payer: Medicaid Other | Admitting: Radiology

## 2014-04-01 DIAGNOSIS — R569 Unspecified convulsions: Secondary | ICD-10-CM

## 2014-04-06 NOTE — Procedures (Signed)
   [facility]  EEG (ELECTROENCEPHALOGRAM) REPORT   STUDY DATE: 04/01/14 PATIENT NAME: Amy [name] DOB: 1955/12/19 MRN: [id number]  ORDERING CLINICIAN: Antony Contras, MD   TECHNOLOGIST: Towana Badger TECHNIQUE: Electroencephalogram was recorded utilizing standard 10-20 system of lead placement and reformatted into average and bipolar montages.  RECORDING TIME: 30 minutes ACTIVATION: photic stimulation  CLINICAL INFORMATION: 58 year old female with staring spells.  FINDINGS: Background rhythms of 9-10 hertz and 60-70 microvolts. No focal, lateralizing, epileptiform activity or seizures are seen. Patient recorded in the awake state.  IMPRESSION:  Normal EEG in the awake state.    INTERPRETING PHYSICIAN:  Penni Bombard, MD Certified in Neurology, Neurophysiology and Neuroimaging  [facility] [address] [place], [place] [address] [phone]

## 2014-04-07 NOTE — Progress Notes (Signed)
Quick Note:  Shared normal EEG results with son(Juan), he verbalized understanding ______

## 2014-04-12 ENCOUNTER — Telehealth: Payer: Self-pay | Admitting: Clinical

## 2014-04-12 NOTE — Telephone Encounter (Signed)
CSW received a call from pt son Elita Quick requesting an appt for pt as she is having pain and leakage from her gtube. Pt son is Spanish speaking so CSW assisted in scheduling an appt for pt via the front office staff. Pt son appreciative of assistance and agreeable to bring his mother on 6/30 at [place], [place], [place]

## 2014-04-13 ENCOUNTER — Ambulatory Visit (HOSPITAL_COMMUNITY)
Admission: RE | Admit: 2014-04-13 | Discharge: 2014-04-13 | Disposition: A | Discharge status: Home / Self Care | Payer: Medicaid Other | Source: Ambulatory Visit | Attending: Family Medicine | Admitting: Family Medicine

## 2014-04-13 ENCOUNTER — Encounter: Payer: Self-pay | Admitting: Family Medicine

## 2014-04-13 ENCOUNTER — Ambulatory Visit (INDEPENDENT_AMBULATORY_CARE_PROVIDER_SITE_OTHER): Payer: Medicaid Other | Admitting: Family Medicine

## 2014-04-13 VITALS — BP 134/79 | HR 95 | Temp 97.8°F | Resp 16 | Wt 141.0 lb

## 2014-04-13 DIAGNOSIS — K9423 Gastrostomy malfunction: Secondary | ICD-10-CM

## 2014-04-13 DIAGNOSIS — Z931 Gastrostomy status: Secondary | ICD-10-CM

## 2014-04-13 DIAGNOSIS — Z431 Encounter for attention to gastrostomy: Secondary | ICD-10-CM | POA: Insufficient documentation

## 2014-04-13 MED ORDER — TRAMADOL HCL 50 MG PO TABS: 50.0000 mg | ORAL_TABLET | Freq: Three times a day (TID) | ORAL | Status: DC | PRN

## 2014-04-13 MED ORDER — IOHEXOL 300 MG/ML  SOLN
50.0000 mL | Freq: Once | INTRAMUSCULAR | Status: AC | PRN
  Administered 2014-04-13: 15 mL

## 2014-04-13 NOTE — Assessment & Plan Note (Signed)
G-tube appears to be coming out on exam. Discussed this with nurse at [facility] (as the patient has previously been seen there) and she recommended that the patient be sent to IR to have imaging done to determine if the tube needs to be replaced or pushed back in. An appointment was made for this to be done this afternoon at 1 pm. I gave a prescription for tramadol for abdominal pain. There was no guarding or rebound on exam to indicate an acute abdomen and her pain is likely related to her g-tube coming out. Will need f/u with her PCP in one week for discussion of removal of g-tube and will need to go back to general surgery for follow-up some time in the near future.

## 2014-04-13 NOTE — Procedures (Signed)
Exchange 07E G tube No complication No blood loss. See complete dictation in [facility]

## 2014-04-13 NOTE — Patient Instructions (Addendum)
Nice to meet you. We are going to send you to radiology to have an imaging study done to check on the placement of the g-tube. They may replace this while you are there. Please take the tramadol as needed for pain. Please return to care if your pain worsens, you start to have vomiting, or are not able to take food or medications by mouth.

## 2014-04-13 NOTE — Progress Notes (Signed)
Patient ID: Amy [name], female   DOB: 28-Oct-1955, 58 y.o.   MRN: [id number]  Tommi Rumps, MD Phone: [phone]  Amy [name] is a 58 y.o. female who presents today for f/u.  G-tube issue: patient accompanied by family who reports that her g-tube is coming out. She has this in place due to swallowing difficulties following a stroke. [place] Has been followed by Dr Rosendo Gros one time for this issue. They report that the g-tube has come out about 1.5-2 inches. The circle was previously flush against the skin. It was not sutured in place. She has had some pain around the site as well. She is taking food and drink PO.  Patient is a nonsmoker.   ROS: Per HPI   Physical Exam Filed Vitals:   04/13/14 0913  BP: 134/79  Pulse: 95  Temp: 97.8 F (36.6 C)  Resp: 16    Gen: Well NAD Abd: soft, g-tube in left mid abdomen appears to be out 1.5-2 inches, there is a small amount of leakage around the site, there is moderate tenderness around the site, though no guarding or rebound   Assessment/Plan: Please see individual problem list.  # Healthcare maintenance: not addressed

## 2014-04-29 ENCOUNTER — Ambulatory Visit: Payer: Medicaid Other | Admitting: Family Medicine

## 2014-05-21 ENCOUNTER — Encounter: Payer: Self-pay | Admitting: Neurology

## 2014-05-26 ENCOUNTER — Encounter: Payer: Self-pay | Admitting: Family Medicine

## 2014-05-26 ENCOUNTER — Ambulatory Visit (INDEPENDENT_AMBULATORY_CARE_PROVIDER_SITE_OTHER): Payer: Medicaid Other | Admitting: Family Medicine

## 2014-05-26 DIAGNOSIS — E119 Type 2 diabetes mellitus without complications: Secondary | ICD-10-CM

## 2014-05-26 DIAGNOSIS — R561 Post traumatic seizures: Secondary | ICD-10-CM

## 2014-05-26 DIAGNOSIS — R569 Unspecified convulsions: Secondary | ICD-10-CM

## 2014-05-26 DIAGNOSIS — I69959 Hemiplegia and hemiparesis following unspecified cerebrovascular disease affecting unspecified side: Secondary | ICD-10-CM

## 2014-05-26 DIAGNOSIS — I69359 Hemiplegia and hemiparesis following cerebral infarction affecting unspecified side: Secondary | ICD-10-CM

## 2014-05-26 DIAGNOSIS — Z931 Gastrostomy status: Secondary | ICD-10-CM

## 2014-05-26 DIAGNOSIS — I69320 Aphasia following cerebral infarction: Secondary | ICD-10-CM

## 2014-05-26 DIAGNOSIS — I69920 Aphasia following unspecified cerebrovascular disease: Secondary | ICD-10-CM

## 2014-05-26 DIAGNOSIS — G839 Paralytic syndrome, unspecified: Secondary | ICD-10-CM

## 2014-05-26 MED ORDER — GLYBURIDE 2.5 MG PO TABS: 1.2500 mg | ORAL_TABLET | Freq: Every day | ORAL | Status: DC

## 2014-05-26 MED ORDER — LEVETIRACETAM 500 MG PO TABS: 500.0000 mg | ORAL_TABLET | Freq: Two times a day (BID) | ORAL | Status: DC

## 2014-05-26 NOTE — Assessment & Plan Note (Addendum)
Replaced in ED last month for pain and falling out. No longer needed. Surgery appt already scheduled for 8/18. - surgery to d/c tube on 8/18. - ok to continue meds, liquids and food PO

## 2014-05-26 NOTE — Assessment & Plan Note (Signed)
Refilled keppra.

## 2014-05-26 NOTE — Assessment & Plan Note (Signed)
Refilled glyburide

## 2014-05-26 NOTE — Progress Notes (Signed)
   Subjective:    Patient ID: Amy [name], female    DOB: [birth year], 58 y.o.   MRN: [id number]  HPI Pt presents for f/u of stroke and g-tube.   Her HH PT and SLP ended and she was benefiting significantly from these and making excellent progress.  Her g-tube was replaced in the ED a few weeks ago for pain. Since then it has leaked a little but mostly is just an aggravation. Because she is now tolerating solids, liquids and pills by mouth, they are no longer using the g-tube for anything. They called the surgeon but he is on vacation so they have an appt next week to have it removed. She has no further pain at the g-tube site.   Review of Systems See HPI    Objective:   Physical Exam  Nursing note and vitals reviewed. Constitutional: She appears well-developed and well-nourished. No distress.  Eyes: Conjunctivae are normal. Right eye exhibits no discharge. Left eye exhibits no discharge. No scleral icterus.  Cardiovascular: Normal rate.   Pulmonary/Chest: Effort normal.  Abdominal: Soft. She exhibits no distension and no mass. There is no tenderness. There is no rebound and no guarding.  Well healed g-tube in place, small amount of granulation tissue surrounding the base  Neurological: She is alert.  hemiparesis  Skin: Skin is warm and dry. She is not diaphoretic.  Psychiatric: She has a normal mood and affect. Her behavior is normal.          Assessment & Plan:

## 2014-05-26 NOTE — Assessment & Plan Note (Signed)
HH PT ended, would benefit from further therapy. Family willing to take her to outpatient - referral placed for outpatient PT and SLP

## 2014-05-26 NOTE — Patient Instructions (Signed)
Para la terapia, he hecho otra referencia y les hablamos cuando hacemos la cita para eso.  Para su tuba, hay que esperar para la cita con el cirugano para que ellos pueden sacarla.  Por favor, regresan en 3 meses para chequear que todo va bien.

## 2014-06-01 ENCOUNTER — Ambulatory Visit (INDEPENDENT_AMBULATORY_CARE_PROVIDER_SITE_OTHER): Payer: Medicaid Other | Admitting: General Surgery

## 2014-06-01 VITALS — BP 140/80 | HR 85 | Temp 98.0°F | Ht 63.0 in | Wt 152.0 lb

## 2014-06-01 DIAGNOSIS — Z931 Gastrostomy status: Secondary | ICD-10-CM

## 2014-06-01 NOTE — Progress Notes (Signed)
Subjective:     Patient ID: Amy [name], female   DOB: 01/14/1956, 58 y.o.   MRN: [id number]  HPI The patient is a 58 year old female who was previously seen considered a previous stroke and Gtube. The patient subsequently went for a swallow study which revealed mild silent aspiration however was able to take thicker foods. Patient subsequently had her G-tube replacedas the balloon popped.  Review of Systems  Constitutional: Negative.   HENT: Negative.   Respiratory: Negative.   Cardiovascular: Negative.   Gastrointestinal: Negative.   Neurological: Negative.   All other systems reviewed and are negative.      Objective:   Physical Exam  Constitutional: She is oriented to person, place, and time. She appears well-developed and well-nourished.  HENT:  Head: Normocephalic and atraumatic.  Eyes: Conjunctivae and EOM are normal. Pupils are equal, round, and reactive to light.  Neck: Normal range of motion. Neck supple.  Cardiovascular: Normal rate, regular rhythm and normal heart sounds.   Pulmonary/Chest: Effort normal and breath sounds normal.  Abdominal: Soft. Bowel sounds are normal.    Musculoskeletal: Normal range of motion.  Neurological: She is alert and oriented to person, place, and time.  Skin: Skin is warm and dry.  Psychiatric: She has a normal mood and affect.       Assessment:     This 58 year old female status post stroke and and G tube placement     Plan:     1. The patient passed a swallow study test. He continues to gain weight by eating oral food and beverages. I would recommend at this time she can have her T-tube removed. 2. Patient followup as needed

## 2014-06-29 ENCOUNTER — Ambulatory Visit: Payer: Medicaid Other | Admitting: Physical Therapy

## 2014-06-29 ENCOUNTER — Ambulatory Visit: Payer: Medicaid Other

## 2014-06-30 ENCOUNTER — Encounter: Payer: Self-pay | Admitting: Family Medicine

## 2014-06-30 NOTE — Progress Notes (Signed)
Son dropped off a referral form to be filled out for therapy.  Please fax when completed and then call him to pick up form.

## 2014-06-30 NOTE — Progress Notes (Signed)
Placed in MDs box to be filled out. [place], [place] CMA 

## 2014-07-05 NOTE — Progress Notes (Signed)
Spoke with pt's son; form was faxed to number provided [phone] and original form ready for pick.  Hassell Done, Rosine Beat, RN

## 2014-07-15 ENCOUNTER — Ambulatory Visit (INDEPENDENT_AMBULATORY_CARE_PROVIDER_SITE_OTHER): Payer: Medicaid Other | Admitting: Family Medicine

## 2014-07-15 ENCOUNTER — Encounter: Payer: Self-pay | Admitting: Family Medicine

## 2014-07-15 VITALS — BP 146/81 | HR 96 | Temp 98.1°F | Wt 154.0 lb

## 2014-07-15 DIAGNOSIS — I69320 Aphasia following cerebral infarction: Secondary | ICD-10-CM

## 2014-07-15 DIAGNOSIS — Z23 Encounter for immunization: Secondary | ICD-10-CM

## 2014-07-15 DIAGNOSIS — I619 Nontraumatic intracerebral hemorrhage, unspecified: Secondary | ICD-10-CM

## 2014-07-15 NOTE — Patient Instructions (Signed)
Nuestra trabajadora social va a llamar a la compania de las terapias para preguntar cuando sera elegible para otra sesion de terapia. Aparte de eso solamente podemos esperar hasta que puede ir a [place].

## 2014-07-16 ENCOUNTER — Telehealth: Payer: Self-pay | Admitting: Clinical

## 2014-07-16 NOTE — Assessment & Plan Note (Signed)
Not talking except for occasional words with family, did not start with stroke but later after prolonged intubation. Likely multifactorial (neuro and psych). Family also reports depressed mood and crying spells. - not eligible for further speech therapy at this time - likely at least some mood component - refer to psych

## 2014-07-16 NOTE — Telephone Encounter (Signed)
CSW contacted [facility] to inquire about when pt's new therapy session will restart on her insurance plan. CSW informed they would restart the 1st of the year however it would be pending dx and authorization from [place].   Hunt Oris, MSW, [place]

## 2014-07-16 NOTE — Progress Notes (Signed)
   Subjective:    Patient ID: Amy [name], female    DOB: 1955/11/04, 58 y.o.   MRN: [id number]  HPI Pt presents for f/u of post-stroke hemiparesis and aphasia.  Pt had g-tube removed since last visit, she is tolerating a normal diet and thin liquids.   She had achieved more mobility of her right side and some improvement in language before her SLP and PT sessions were stopped. Unable to qualify for more due to medicaid. Is on waiting list for free PT clinic at [facility].   She is saying a few words at home and communicating through gestures. Family reports that her speech was ok after the initial stroke but she stopped talking later after being intubated for a significant period. They are also concerned about her mood and reports she seems depressed and frustrated and cries a lot. Patient nods to indicated she is interested in getting help with her mood.   Review of Systems See HPI    Objective:   Physical Exam  Nursing note and vitals reviewed. Constitutional: She is oriented to person, place, and time. She appears well-developed and well-nourished. No distress.  Eyes: Conjunctivae are normal. Right eye exhibits no discharge. Left eye exhibits no discharge. No scleral icterus.  Cardiovascular: Normal rate.   Pulmonary/Chest: Effort normal.  Abdominal: She exhibits no distension.  Neurological: She is alert and oriented to person, place, and time.  Hemiparesis of right side, mumbled speech when she attempts which is rare  Skin: Skin is warm and dry. She is not diaphoretic.          Assessment & Plan:

## 2014-07-16 NOTE — Assessment & Plan Note (Signed)
Ongoing need for PT, neurosurgery follow-up, missing skull flap, residual neuro defects including hemiparesis, aphasia - followed by neurology - SW to call about eligibility for further PT through medicaid in the future - on waiting list for [facility] clinic - resent referral for neurosurgery (previous declined to see due to lack of records, we have tried repeatedly to obtain them and have been unsuccessful, she needs to be seen by a Psychologist, sport and exercise)

## 2014-08-10 ENCOUNTER — Telehealth: Payer: Self-pay | Admitting: Clinical

## 2014-08-10 NOTE — Telephone Encounter (Signed)
CSW contacted pt's son regarding placing a referral to [facility]. CSW explained what the program offers and explored whether pt and family would be agreeable to having CSW place a referral to see if pt would qualify for services. Son confirmed that pt and family would be agreeable and a referral would be appreciated.  A referral has been sent to [facility].  Hunt Oris, MSW, [place]

## 2014-08-18 ENCOUNTER — Telehealth: Payer: Self-pay | Admitting: Clinical

## 2014-08-18 NOTE — Telephone Encounter (Signed)
CSW received a message from [place] with [facility] that she will be calling pt (with an interpretor) this week. Hunt Oris, MSW, [place]

## 2014-10-01 ENCOUNTER — Ambulatory Visit: Payer: Medicaid Other | Admitting: Neurology

## 2014-10-12 ENCOUNTER — Encounter: Payer: Self-pay | Admitting: Family Medicine

## 2014-10-12 ENCOUNTER — Ambulatory Visit (INDEPENDENT_AMBULATORY_CARE_PROVIDER_SITE_OTHER): Payer: Medicaid Other | Admitting: Family Medicine

## 2014-10-12 VITALS — BP 124/72 | HR 111 | Temp 98.1°F | Ht 63.0 in | Wt 151.0 lb

## 2014-10-12 DIAGNOSIS — E119 Type 2 diabetes mellitus without complications: Secondary | ICD-10-CM

## 2014-10-12 DIAGNOSIS — I10 Essential (primary) hypertension: Secondary | ICD-10-CM

## 2014-10-12 DIAGNOSIS — R569 Unspecified convulsions: Secondary | ICD-10-CM

## 2014-10-12 DIAGNOSIS — J383 Other diseases of vocal cords: Secondary | ICD-10-CM

## 2014-10-12 MED ORDER — GLYBURIDE 2.5 MG PO TABS: 1.2500 mg | ORAL_TABLET | Freq: Every day | ORAL | Status: DC

## 2014-10-12 MED ORDER — LEVETIRACETAM 500 MG PO TABS: 500.0000 mg | ORAL_TABLET | Freq: Two times a day (BID) | ORAL | Status: DC

## 2014-10-12 NOTE — Assessment & Plan Note (Signed)
Concern that vocal cords could be contributing to lack of progress with speech - refer to ENT

## 2014-10-12 NOTE — Progress Notes (Signed)
   Subjective:    Patient ID: Amy [name], female    DOB: 07-Sep-1956, 58 y.o.   MRN: [id number]  HPI Pt presents for f/u of aphasia and right sided weakness following stroke.  Daughter-in-law reports minimal progress with speech and physical therapies and according to therapists and neuro she is not expected to make much more progress at this point.   They would like to continue the therapies but are frustrated that they keep getting moved around and feeling like she has to start over every time. I referred [facility] to [facility] but they do not want to switch doctors and it would be difficult to transport her there for the frequent visits she would need. They are still thinking about whether it is worthwhile to go that route.  They are concerned that part of her speech difficulty may be due to her prolonged intubation after the stroke damaging her vocal cords. They recognize that it is probably mostly her stroke but she speaks more at home and makes more noise than expected and has a good memory and seems to understand everything they say so they want to make sure that her vocal cords aren't making progress more difficult.  They have been told that she would qualify for more therapies if she was a citizen but are concerned that she would not be able to pass the citizenship tests. I reassured them that I can fill out a form to get her a medical exception to the [place] and civics tests.  Review of Systems See HPI    Objective:   Physical Exam  Constitutional: She is oriented to person, place, and time. She appears well-developed and well-nourished. No distress.  She presents without a wheelchair for the first time!  HENT:  Head: Normocephalic and atraumatic.  Eyes: Conjunctivae are normal. Right eye exhibits no discharge. Left eye exhibits no discharge. No scleral icterus.  Cardiovascular: Normal rate.   Pulmonary/Chest: Effort normal. No respiratory distress.  Abdominal: She exhibits no  distension.  Neurological: She is alert and oriented to person, place, and time.  Skin: Skin is warm and dry. No rash noted. She is not diaphoretic.  Psychiatric: She has a normal mood and affect. Her behavior is normal.  Nursing note and vitals reviewed.         Assessment & Plan:

## 2014-10-12 NOTE — Patient Instructions (Signed)
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 Las cuerdas vocales son dos msculos elsticos ubicados dentro de la laringe. Cuando inhala, las cuerdas vocales se abren bien para permitir el ingreso de aire a los pulmones. Cuando come o traga, las cuerdas vocales se cierran completamente para evitar que los alimentos y los lquidos ingresen a los pulmones. Cuando habla, las cuerdas vocales se acercan entre s y vibran para crear sonidos. En la [employer], la parlisis de las cuerdas vocales afecta nicamente a una cuerda vocal y no es peligrosa. En contadas ocasiones, si ambas cuerdas vocales estn paralizadas, puede tener problemas para respirar o impedir que la comida y los lquidos lleguen a los pulmones. Ese tipo de parlisis es peligrosa.  CAUSAS Hay muchas causas de parlisis de las cuerdas vocales:   Dao a un nervio que controla el movimiento de las cuerdas vocales.  Lesin en el cuello o trax.  Cncer de laringe, cuello, cerebro o trax.  Ictus.  Enfermedades del [place].  Infecciones virales. A veces la causa es desconocida. [place] signos y los sntomas de parlisis de las cuerdas vocales dependen de si una o ambas estn paralizadas, y de la posicin en que estn paralizadas. Los signos y sntomas incluyen:  Cambio en la calidad de la voz.  Problemas respiratorios.  Ronquera.  Voz dbil y [name].  Respiracin ruidosa.  Ahogos y tos al comer o beber lquidos (aspiracin).  Infeccin pulmonar (neumona) debido a la aspiracin. DIAGNSTICO  La parlisis de las cuerdas vocales se puede diagnosticar a partir de la historia clnica y de un examen fsico. Pueden hacerle estudios para confirmar el diagnstico. Estos estudios pueden incluir:  Un examen de las cuerdas vocales con un endoscopio flexible que emite luz  (laringoscopia endoscpica).  Un examen que mide las corrientes elctricas de los nervios y msculos larngeos (electromiografa larngea).  Anlisis de [place].  Radiografas.  Estudios de diagnstico por imgenes, como resonancia magntica o tomografa computarizada. TRATAMIENTO  El tratamiento de la parlisis de las cuerdas vocales depende del tipo de parlisis. Los tratamientos incluyen:  Terapia del habla.  Trasplante de msculos y nervios.  Ciruga para acercar las cuerdas.  Inyeccin de una sustancia para aumentar el tamao de una cuerda vocal paralizada. INSTRUCCIONES PARA EL CUIDADO EN EL HOGAR  Coma y beba lentamente.  No sobreexija la voz.  No fume.  Concurra a todas las visitas de control como se lo haya indicado el mdico. SOLICITE ATENCIN MDICA SI:  La voz se le debilita o se le vuelve ms ronca.  Comienza a hacer ruido al [employer]  Tose o se ahoga mientras come o traga.  Tiene tos y [place]. SOLICITE ATENCIN MDICA DE INMEDIATO SI:  Tiene dificultad para respirar.  Siente dolor en el pecho. ASEGRESE DE QUE:  Comprende estas instrucciones.  Controlar su afeccin.  Recibir ayuda de inmediato si no mejora o si empeora. Document Released: 01/17/2009 Document Revised: 02/15/2014 [facility] Patient Information 2015 [place], [place]. This information is not intended to replace advice given to you by your health care provider. Make sure you discuss any questions you have with your health care provider.

## 2014-10-12 NOTE — Assessment & Plan Note (Signed)
Initial reading elevated to 166/96 today but normal on recheck - monitor

## 2014-10-14 ENCOUNTER — Ambulatory Visit: Payer: Medicaid Other | Admitting: Neurology

## 2014-10-19 ENCOUNTER — Ambulatory Visit: Payer: Medicaid Other | Admitting: Neurology

## 2014-10-22 ENCOUNTER — Telehealth: Payer: Self-pay | Admitting: Family Medicine

## 2014-10-22 NOTE — Telephone Encounter (Signed)
pts husband came into the clinic today, says the pt is supposed to be referred some where for therapy to recover from problems when she had a stroke. Would like a call back once this has been done.

## 2014-10-25 ENCOUNTER — Observation Stay (HOSPITAL_COMMUNITY)
Admission: EM | Admit: 2014-10-25 | Discharge: 2014-10-26 | Disposition: A | Discharge status: Home / Self Care | Payer: Medicaid Other | Attending: Family Medicine | Admitting: Family Medicine

## 2014-10-25 ENCOUNTER — Emergency Department (HOSPITAL_COMMUNITY): Payer: Medicaid Other

## 2014-10-25 DIAGNOSIS — I10 Essential (primary) hypertension: Secondary | ICD-10-CM | POA: Diagnosis not present

## 2014-10-25 DIAGNOSIS — I69351 Hemiplegia and hemiparesis following cerebral infarction affecting right dominant side: Secondary | ICD-10-CM | POA: Diagnosis not present

## 2014-10-25 DIAGNOSIS — E079 Disorder of thyroid, unspecified: Secondary | ICD-10-CM | POA: Insufficient documentation

## 2014-10-25 DIAGNOSIS — I69320 Aphasia following cerebral infarction: Secondary | ICD-10-CM | POA: Diagnosis not present

## 2014-10-25 DIAGNOSIS — E860 Dehydration: Secondary | ICD-10-CM | POA: Diagnosis not present

## 2014-10-25 DIAGNOSIS — Z91013 Allergy to seafood: Secondary | ICD-10-CM | POA: Diagnosis not present

## 2014-10-25 DIAGNOSIS — R569 Unspecified convulsions: Secondary | ICD-10-CM | POA: Diagnosis not present

## 2014-10-25 DIAGNOSIS — G9389 Other specified disorders of brain: Secondary | ICD-10-CM | POA: Diagnosis not present

## 2014-10-25 DIAGNOSIS — Z888 Allergy status to other drugs, medicaments and biological substances status: Secondary | ICD-10-CM | POA: Insufficient documentation

## 2014-10-25 DIAGNOSIS — R51 Headache: Secondary | ICD-10-CM | POA: Diagnosis present

## 2014-10-25 DIAGNOSIS — E1165 Type 2 diabetes mellitus with hyperglycemia: Principal | ICD-10-CM | POA: Insufficient documentation

## 2014-10-25 DIAGNOSIS — E119 Type 2 diabetes mellitus without complications: Secondary | ICD-10-CM

## 2014-10-25 DIAGNOSIS — K219 Gastro-esophageal reflux disease without esophagitis: Secondary | ICD-10-CM | POA: Insufficient documentation

## 2014-10-25 DIAGNOSIS — R519 Headache, unspecified: Secondary | ICD-10-CM | POA: Insufficient documentation

## 2014-10-25 DIAGNOSIS — E785 Hyperlipidemia, unspecified: Secondary | ICD-10-CM | POA: Diagnosis not present

## 2014-10-25 DIAGNOSIS — R739 Hyperglycemia, unspecified: Secondary | ICD-10-CM | POA: Diagnosis present

## 2014-10-25 HISTORY — DX: Unspecified intracranial injury with loss of consciousness of unspecified duration, initial encounter: S06.9X9A

## 2014-10-25 HISTORY — DX: Unspecified intracranial injury with loss of consciousness status unknown, initial encounter: S06.9XAA

## 2014-10-25 HISTORY — DX: Other diseases of vocal cords: J38.3

## 2014-10-25 LAB — CBC WITH DIFFERENTIAL/PLATELET
Basophils Absolute: 0 10*3/uL (ref 0.0–0.1)
Basophils Relative: 0 % (ref 0–1)
Eosinophils Absolute: 0.1 10*3/uL (ref 0.0–0.7)
Eosinophils Relative: 1 % (ref 0–5)
HEMATOCRIT: 42.4 % (ref 36.0–46.0)
Hemoglobin: 14.6 g/dL (ref 12.0–15.0)
LYMPHS ABS: 2 10*3/uL (ref 0.7–4.0)
Lymphocytes Relative: 27 % (ref 12–46)
MCH: 30.5 pg (ref 26.0–34.0)
MCHC: 34.4 g/dL (ref 30.0–36.0)
MCV: 88.7 fL (ref 78.0–100.0)
MONO ABS: 0.4 10*3/uL (ref 0.1–1.0)
MONOS PCT: 5 % (ref 3–12)
NEUTROS ABS: 4.9 10*3/uL (ref 1.7–7.7)
Neutrophils Relative %: 67 % (ref 43–77)
PLATELETS: 206 10*3/uL (ref 150–400)
RBC: 4.78 MIL/uL (ref 3.87–5.11)
RDW: 12.7 % (ref 11.5–15.5)
WBC: 7.4 10*3/uL (ref 4.0–10.5)

## 2014-10-25 LAB — URINALYSIS, ROUTINE W REFLEX MICROSCOPIC
BILIRUBIN URINE: NEGATIVE
Glucose, UA: >1000 mg/dL — AB
Hgb urine dipstick: NEGATIVE
Ketones, ur: NEGATIVE mg/dL
Leukocytes, UA: NEGATIVE
NITRITE: NEGATIVE
Protein, ur: NEGATIVE mg/dL
SPECIFIC GRAVITY, URINE: 1.029 (ref 1.005–1.030)
UROBILINOGEN UA: 1 mg/dL (ref 0.0–1.0)
pH: 7 (ref 5.0–8.0)

## 2014-10-25 LAB — COMPREHENSIVE METABOLIC PANEL
ALBUMIN: 3.9 g/dL (ref 3.5–5.2)
ALT: 30 U/L (ref 0–35)
AST: 32 U/L (ref 0–37)
Alkaline Phosphatase: 103 U/L (ref 39–117)
Anion gap: 16 — ABNORMAL HIGH (ref 5–15)
BUN: 8 mg/dL (ref 6–23)
CO2: 17 mmol/L — ABNORMAL LOW (ref 19–32)
Calcium: 9 mg/dL (ref 8.4–10.5)
Chloride: 101 mEq/L (ref 96–112)
Creatinine, Ser: 0.69 mg/dL (ref 0.50–1.10)
GFR calc Af Amer: >90 mL/min (ref >90)
GLUCOSE: 402 mg/dL — AB (ref 70–99)
Potassium: 3.6 mmol/L (ref 3.5–5.1)
Sodium: 134 mmol/L — ABNORMAL LOW (ref 135–145)
TOTAL PROTEIN: 6.7 g/dL (ref 6.0–8.3)
Total Bilirubin: 0.6 mg/dL (ref 0.3–1.2)

## 2014-10-25 LAB — I-STAT CHEM 8, ED
BUN: 8 mg/dL (ref 6–23)
CALCIUM ION: 1.12 mmol/L (ref 1.12–1.23)
CREATININE: 0.6 mg/dL (ref 0.50–1.10)
Chloride: 106 mEq/L (ref 96–112)
Glucose, Bld: 310 mg/dL — ABNORMAL HIGH (ref 70–99)
HCT: 40 % (ref 36.0–46.0)
Hemoglobin: 13.6 g/dL (ref 12.0–15.0)
POTASSIUM: 3.6 mmol/L (ref 3.5–5.1)
SODIUM: 140 mmol/L (ref 135–145)
TCO2: 18 mmol/L (ref 0–100)

## 2014-10-25 LAB — URINE MICROSCOPIC-ADD ON

## 2014-10-25 LAB — CBG MONITORING, ED
GLUCOSE-CAPILLARY: 356 mg/dL — AB (ref 70–99)
Glucose-Capillary: 280 mg/dL — ABNORMAL HIGH (ref 70–99)

## 2014-10-25 MED ORDER — METOCLOPRAMIDE HCL 5 MG/ML IJ SOLN
10.0000 mg | Freq: Once | INTRAMUSCULAR | Status: AC
  Administered 2014-10-25: 10 mg via INTRAVENOUS
  Filled 2014-10-25: qty 2

## 2014-10-25 MED ORDER — DIPHENHYDRAMINE HCL 50 MG/ML IJ SOLN
25.0000 mg | Freq: Once | INTRAMUSCULAR | Status: AC
  Administered 2014-10-25: 25 mg via INTRAVENOUS
  Filled 2014-10-25: qty 1

## 2014-10-25 MED ORDER — INSULIN ASPART 100 UNIT/ML ~~LOC~~ SOLN
5.0000 [IU] | Freq: Once | SUBCUTANEOUS | Status: AC
  Administered 2014-10-25: 5 [IU] via INTRAVENOUS
  Filled 2014-10-25: qty 1

## 2014-10-25 MED ORDER — SODIUM CHLORIDE 0.9 % IV BOLUS (SEPSIS)
2000.0000 mL | Freq: Once | INTRAVENOUS | Status: AC
  Administered 2014-10-25: 1000 mL via INTRAVENOUS

## 2014-10-25 NOTE — ED Notes (Signed)
Patient transported to CT 

## 2014-10-25 NOTE — ED Notes (Signed)
CBG 280 

## 2014-10-25 NOTE — ED Provider Notes (Signed)
CSN: [id number]     Arrival date & time 10/25/14  1916 History   First MD Initiated Contact with Patient 10/25/14 2109     Chief Complaint  Patient presents with  . Headache  . Dizziness     (Consider location/radiation/quality/duration/timing/severity/associated sxs/prior Treatment) The history is provided by the patient.  Amy [name] is a 59 y.o. female hx of HTN, DM, GERD, stroke with expressive aphasia here with headache. She was watching TV and around 7 PM was complaining of some headache along with dizziness. It was gradual onset. She doesn't speak much at baseline and was pointing to her head. She has residual right-sided weakness after the previous stroke but didn't get worse. Denies any vomiting. Denies any fever or neck pain. No previous aneurysms.   Family translating   Past Medical History  Diagnosis Date  . Hypertension   . Diabetes mellitus   . Hypercholesteremia   . GERD (gastroesophageal reflux disease)   . Thyroid disease   . Stroke   . Dysphagia    No past surgical history on file. Family History  Problem Relation Age of Onset  . Heart Problems Father    History  Substance Use Topics  . Smoking status: Never Smoker   . Smokeless tobacco: Never Used  . Alcohol Use: No   OB History    No data available     Review of Systems  Neurological: Positive for dizziness and headaches.  All other systems reviewed and are negative.     Allergies  Shrimp and Phenytoin  Home Medications   Prior to Admission medications   Medication Sig Start Date End Date Taking? Authorizing Provider  glyBURIDE (DIABETA) 2.5 MG tablet Take 0.5 tablets (1.25 mg total) by mouth daily with breakfast. 10/12/14  Yes Frazier Richards, MD  levETIRAcetam (KEPPRA) 500 MG tablet Take 1 tablet (500 mg total) by mouth 2 (two) times daily. 10/12/14  Yes Frazier Richards, MD   BP 138/70 mmHg  Pulse 98  Temp(Src) 97.5 F (36.4 C) (Oral)  Resp 18  Ht 5\' 3"  (1.6 m)  Wt 150 lb (68.04  kg)  BMI 26.58 kg/m2  SpO2 94% Physical Exam  Constitutional:  Chronically ill   HENT:  Head: Normocephalic.  Mouth/Throat: Oropharynx is clear and moist.  Eyes: Conjunctivae and EOM are normal. Pupils are equal, round, and reactive to light.  Neck: Normal range of motion. Neck supple.  Cardiovascular: Normal rate, regular rhythm and normal heart sounds.   Pulmonary/Chest: Effort normal and breath sounds normal. No respiratory distress. She has no wheezes. She has no rales.  Abdominal: Soft. Bowel sounds are normal. She exhibits no distension. There is no tenderness. There is no rebound.  Musculoskeletal: Normal range of motion.  Neurological: She is alert.  + R facial droop (chronic). Strength 4/5 R arm and R leg (chronic), 5/5 L side   Skin: Skin is warm and dry.  Psychiatric: She has a normal mood and affect. Her behavior is normal. Judgment and thought content normal.  Nursing note and vitals reviewed.   ED Course  Procedures (including critical care time) Labs Review Labs Reviewed  COMPREHENSIVE METABOLIC PANEL - Abnormal; Notable for the following:    Sodium 134 (*)    CO2 17 (*)    Glucose, Bld 402 (*)    Anion gap 16 (*)    All other components within normal limits  URINALYSIS, ROUTINE W REFLEX MICROSCOPIC - Abnormal; Notable for the following:    Glucose, UA >  1000 (*)    All other components within normal limits  CBG MONITORING, ED - Abnormal; Notable for the following:    Glucose-Capillary 356 (*)    All other components within normal limits  CBG MONITORING, ED - Abnormal; Notable for the following:    Glucose-Capillary 280 (*)    All other components within normal limits  I-STAT CHEM 8, ED - Abnormal; Notable for the following:    Glucose, Bld 310 (*)    All other components within normal limits  URINE CULTURE  CBC WITH DIFFERENTIAL  URINE MICROSCOPIC-ADD ON    Imaging Review Ct Head (brain) Wo Contrast  10/25/2014   CLINICAL DATA:  Headache and  dizziness, symptoms began today.  EXAM: CT HEAD WITHOUT CONTRAST  TECHNIQUE: Contiguous axial images were obtained from the base of the skull through the vertex without intravenous contrast.  COMPARISON:  None.  FINDINGS: Patient is post left craniectomy with encephalomalacia in the left frontal, parietal, and temporal lobes. There is no subdural or intracranial fluid collection. No intracranial hemorrhage. The ventricles are normal, no hydrocephalus. No CT findings of acute infarct. There is paranasal sinus mucosal thickening of the right maxillary sinus, partially included. Mastoid air cells are clear.  IMPRESSION: 1. Post left craniectomy with encephalomalacia in the left MCA distribution. 2. No evidence of acute intracranial abnormality. 3. Right maxillary sinus mucosal thickening.   Electronically Signed   By: Jeb Levering M.D.   On: 10/25/2014 22:13     EKG Interpretation None      MDM   Final diagnoses:  None    Amy [name] is a 59 y.o. female here with headache, hyperglycemia. She is within 4 hrs so will get CT head, if neg will not need LP to r/o subarachnoid. She is hyperglycemic with AG 16. No vomiting, likely dehydration. Will hydrate and reassess.   11:26 PM CT showed no new bleed. CBG dec to 280 after insulin, IVF. I called family practice and discussed with resident. We agreed to check repeat chemistry and if bicarb improved and AG decreased, can be discharged with f/u in clinic tomorrow. However, repeat chem 8 showed bicarb 18 and AG still 16 after 1 L NS bolus and 5 U insulin. Will give another 1L NS. Will admit to family practice for dehydration, hyperglycemia with gap with no ketosis likely from dehydration.      Wandra Arthurs, MD 10/25/14 [phone]

## 2014-10-25 NOTE — Telephone Encounter (Signed)
Will forward to PCP, patient needs new PT referral as referral form 2015 has expired.

## 2014-10-25 NOTE — ED Notes (Signed)
2 bolus hung.

## 2014-10-25 NOTE — H&P (Signed)
[facility] Admission History and Physical Service Pager: [phone]  Patient name: Amy [name] Medical record number: [id number] Date of birth: 03/27/1956 Age: 59 y.o. Gender: female  Primary Care Provider: Beverlyn Roux, MD Consultants: none Code Status: Full  Chief Complaint: headache  Assessment and Plan: Amy [name] is a 59 y.o. female presenting with headache and found to be hypertensive and hyperglycemia. 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 initial CBG 350-400, CO2 17 and gap 16. Received 2 boluses and repeat lab work still with slight acidosis so asked to admit. - admit to med-surg - NS fluids overnight @ 100cc/hr - repeat bmet at 0600 - SSI - hold home glyburide  # Headache: CT head no acute abnormality (encephalomalacia left MCA distribution). Improved in ED without pain medications.  - tylenol PRN - monitor  # Hypertension: initial BP elevated 174/82, improved to 371-062 systolic - monitor, not on any antihypertensives as outpt  FEN/GI: diet carb mod / NS @ 100cc/hr Prophylaxis: SCDs  Disposition: observation  History of Present Illness: Level 5 caveat, pt doesn't speak at baseline, history per family at bedside. Amy [name]. Amy [name] is a 59 y.o. female presenting with headache. Headache started around 6pm this evening. She says pain is located in front and back of head. Also had some associated dizziness and "numbness" of her lower extremities and face. She reports never having these symptoms before. She denies any CP, SOB, nausea or vomiting, diarrhea or constipation, no recent illness or sick contacts. She has not missed any of her doses of diabetes medications and she has not had elevated sugars in the past. She has a history of TBI requiring craniotomy, and has  residual weakness of her right side.   Review Of Systems: Per HPI with the following additions: none Otherwise 12 point review of systems was performed and was unremarkable.  Patient Active Problem List   Diagnosis Date Noted  . Hyperglycemia 10/26/2014  . Dehydration 10/25/2014  . Vocal cord dysfunction 10/12/2014  . Change in vision 03/02/2014  . Seizures 02/24/2014  . Hemorrhagic stroke 02/16/2014  . Hypertension 02/16/2014  . DM type 2 (diabetes mellitus, type 2) 02/16/2014  . Aphasia as late effect of stroke 02/16/2014  . Paralysis 02/16/2014  . Hyperlipidemia 02/16/2014   Past Medical History: Past Medical History  Diagnosis Date  . Hypertension   . Diabetes mellitus   . Hypercholesteremia   . GERD (gastroesophageal reflux disease)   . Thyroid disease   . Stroke   . Dysphagia    Past Surgical History: No past surgical history on file. Social History: History  Substance Use Topics  . Smoking status: Never Smoker   . Smokeless tobacco: Never [name]  . Alcohol Use: No   Additional social history:   Please also refer to relevant sections of EMR.  Family History: Family History  Problem Relation Age of Onset  . Heart Problems Father    Allergies and Medications: Allergies  Allergen Reactions  . Shrimp [Shellfish Allergy] Anaphylaxis  . Phenytoin Hives and Itching   No current facility-administered medications on file prior to encounter.   Current Outpatient Prescriptions on File Prior to Encounter  Medication Sig Dispense Refill  . glyBURIDE (DIABETA) 2.5 MG tablet Take 0.5 tablets (1.25 mg total) by mouth daily with breakfast. 45 tablet 12  . levETIRAcetam (KEPPRA) 500 MG tablet Take  1 tablet (500 mg total) by mouth 2 (two) times daily. 180 tablet 12    Objective: BP 155/80 mmHg  Pulse 100  Temp(Src) 97.5 F (36.4 C) (Oral)  Resp 18  Ht 5\' 3"  (1.6 m)  Wt 150 lb (68.04 kg)  BMI 26.58 kg/m2  SpO2 93% Exam: General: NAD, doesn't speak, motions to  family members to answer questions for her but does nod/shake head HEENT: asymmetric skull/left craniotomy. PERRL, EOMI, oral mucosa mildly dry Cardiovascular: borderline tachycardia, normal s1 and s2, no m/r/g. 2+ radial pulses bilaterally Respiratory: clear bilaterally, normal WOB Abdomen: soft, nontender, nondistended, no organomegaly, normal bwoel sounds. Well healed surgical scar from stoma left side abdomen Extremities: no edema or cyanosis. WWP. Skin: no rashes Neuro: alert. Residual right sided weakness/right hand contracted.  Labs and Imaging: CBC BMET   Recent Labs Lab 10/25/14 1929 10/25/14 2322  WBC 7.4  --   HGB 14.6 13.6  HCT 42.4 40.0  PLT 206  --     Recent Labs Lab 10/25/14 1929 10/25/14 2322  NA 134* 140  K 3.6 3.6  CL 101 106  CO2 17*  --   BUN 8 8  CREATININE 0.69 0.60  GLUCOSE 402* 310*  CALCIUM 9.0  --      Ct Head (brain) Wo Contrast  10/25/2014   CLINICAL DATA:  Headache and dizziness, symptoms began today.  EXAM: CT HEAD WITHOUT CONTRAST  TECHNIQUE: Contiguous axial images were obtained from the base of the skull through the vertex without intravenous contrast.  COMPARISON:  None.  FINDINGS: Patient is post left craniectomy with encephalomalacia in the left frontal, parietal, and temporal lobes. There is no subdural or intracranial fluid collection. No intracranial hemorrhage. The ventricles are normal, no hydrocephalus. No CT findings of acute infarct. There is paranasal sinus mucosal thickening of the right maxillary sinus, partially included. Mastoid air cells are clear.  IMPRESSION: 1. Post left craniectomy with encephalomalacia in the left MCA distribution. 2. No evidence of acute intracranial abnormality. 3. Right maxillary sinus mucosal thickening.   Electronically Signed   By: Jeb Levering M.D.   On: 10/25/2014 22:13    Leone Brand, MD 10/26/2014, 12:17 AM PGY-2, [place] Intern pager: [phone], text pages  welcome

## 2014-10-25 NOTE — ED Notes (Signed)
Pt. reports headache with dizziness onset today , no nausea , denies fever or chills.

## 2014-10-25 NOTE — ED Notes (Signed)
CBG 356 

## 2014-10-26 ENCOUNTER — Encounter (HOSPITAL_COMMUNITY): Payer: Self-pay | Admitting: General Practice

## 2014-10-26 DIAGNOSIS — I10 Essential (primary) hypertension: Secondary | ICD-10-CM

## 2014-10-26 DIAGNOSIS — R519 Headache, unspecified: Secondary | ICD-10-CM | POA: Insufficient documentation

## 2014-10-26 DIAGNOSIS — R739 Hyperglycemia, unspecified: Secondary | ICD-10-CM | POA: Diagnosis present

## 2014-10-26 DIAGNOSIS — E119 Type 2 diabetes mellitus without complications: Secondary | ICD-10-CM

## 2014-10-26 DIAGNOSIS — R51 Headache: Secondary | ICD-10-CM

## 2014-10-26 DIAGNOSIS — E860 Dehydration: Secondary | ICD-10-CM

## 2014-10-26 LAB — BASIC METABOLIC PANEL
ANION GAP: 3 — AB (ref 5–15)
BUN: 7 mg/dL (ref 6–23)
CO2: 23 mmol/L (ref 19–32)
Calcium: 7.7 mg/dL — ABNORMAL LOW (ref 8.4–10.5)
Chloride: 108 mEq/L (ref 96–112)
Creatinine, Ser: 0.51 mg/dL (ref 0.50–1.10)
GFR calc Af Amer: >90 mL/min (ref >90)
Glucose, Bld: 242 mg/dL — ABNORMAL HIGH (ref 70–99)
POTASSIUM: 3.5 mmol/L (ref 3.5–5.1)
SODIUM: 134 mmol/L — AB (ref 135–145)

## 2014-10-26 LAB — HEMOGLOBIN A1C
HEMOGLOBIN A1C: 11.5 % — AB (ref <5.7)
MEAN PLASMA GLUCOSE: 283 mg/dL — AB (ref <117)

## 2014-10-26 LAB — GLUCOSE, CAPILLARY: Glucose-Capillary: 237 mg/dL — ABNORMAL HIGH (ref 70–99)

## 2014-10-26 LAB — CBG MONITORING, ED: Glucose-Capillary: 250 mg/dL — ABNORMAL HIGH (ref 70–99)

## 2014-10-26 MED ORDER — LEVETIRACETAM 500 MG PO TABS
500.0000 mg | ORAL_TABLET | Freq: Two times a day (BID) | ORAL | Status: DC
  Administered 2014-10-26: 500 mg via ORAL
  Filled 2014-10-26: qty 1

## 2014-10-26 MED ORDER — ACETAMINOPHEN 650 MG RE SUPP: 650.0000 mg | Freq: Four times a day (QID) | RECTAL | Status: DC | PRN

## 2014-10-26 MED ORDER — ONDANSETRON HCL 4 MG PO TABS: 4.0000 mg | ORAL_TABLET | Freq: Four times a day (QID) | ORAL | Status: DC | PRN

## 2014-10-26 MED ORDER — ACETAMINOPHEN 325 MG PO TABS
650.0000 mg | ORAL_TABLET | Freq: Four times a day (QID) | ORAL | Status: DC | PRN
  Filled 2014-10-26: qty 2

## 2014-10-26 MED ORDER — ONDANSETRON HCL 4 MG/2ML IJ SOLN: 4.0000 mg | Freq: Four times a day (QID) | INTRAMUSCULAR | Status: DC | PRN

## 2014-10-26 MED ORDER — INSULIN ASPART 100 UNIT/ML ~~LOC~~ SOLN
0.0000 [IU] | Freq: Three times a day (TID) | SUBCUTANEOUS | Status: DC
  Administered 2014-10-26 (×2): 5 [IU] via SUBCUTANEOUS
  Filled 2014-10-26: qty 1

## 2014-10-26 MED ORDER — LEVETIRACETAM 500 MG PO TABS
500.0000 mg | ORAL_TABLET | Freq: Two times a day (BID) | ORAL | Status: DC
  Filled 2014-10-26: qty 1

## 2014-10-26 MED ORDER — SODIUM CHLORIDE 0.9 % IV SOLN
INTRAVENOUS | Status: DC
  Administered 2014-10-26: 03:00:00 via INTRAVENOUS

## 2014-10-26 MED ORDER — METFORMIN HCL 500 MG PO TABS: 500.0000 mg | ORAL_TABLET | Freq: Two times a day (BID) | ORAL | Status: DC

## 2014-10-26 NOTE — ED Notes (Signed)
MD made aware that keppra orders state start tomorrow.  MD to correct to ensure patient gets medication today

## 2014-10-26 NOTE — Progress Notes (Signed)
UR completed 

## 2014-10-26 NOTE — ED Notes (Signed)
Patient denies any dizziness.  States she has a little headache.  Refused meds at this time.

## 2014-10-26 NOTE — Progress Notes (Signed)
Admission note:  Arrival Method: Patient arrived in bed with staff and daughter-in-law accompanying from ED. Mental Orientation: Alert and oriented x 4.  Speaks spanish.  Interpreter needed. Assessment: See doc flow sheets. Skin: Warm, dry and intact. IV: Intact and infusing NS at 147ml/hr. Pain: Denied any pain. Fall Prevention Safety Plan: Spanish version of hand out on fall prevention safety plan given to patient, through interpreter educated patient about the plan, understood and acknowledged. Admission Screening: In progress. 6700 Orientation: Patient has been oriented to the unit, staff and to the room.

## 2014-10-26 NOTE — Discharge Instructions (Signed)
Please take Metformin twice a day with food. Come to appointment with Dr. Wendi Snipes on 10/28/14 at 4:15pm.  Metformin tablets Qu es este medicamento? La METFORMINA se [place] para tratar la diabetes tipo 2. Ayuda a Advice worker de Dispensing optician. El tratamiento se [place] con ejercicios y [place]. Este [employer] se puede usar solo o con otros medicamentos para la diabetes, incluyendo la insulina. Este medicamento puede ser utilizado para otros usos; si tiene alguna pregunta consulte con su proveedor de atencin mdica o con su farmacutico. MARCAS COMERCIALES DISPONIBLES: Glucophage Qu le debo informar a mi profesional de la salud antes de tomar este medicamento? Necesita saber si usted presenta alguno de los siguientes problemas o situaciones: -anemia -si consume bebidas alcohlicas con frecuencia -se deshidrata con facilidad -ataque cardiaco -insuficiencia cardiaca tratada con medicamentos -enfermedad renal -enfermedad heptica -ovarios poliqusticos -infeccin o lesin severa -vmito -una reaccin alrgica o inusual a la metformina, a otros medicamentos, alimentos, colorantes o conservantes -si est embarazada o buscando quedar embarazada -si est amamantando a un beb Cmo debo utilizar este medicamento? Tome este medicamento por va oral. Tmelo con las comidas. Trague las tabletas con un vaso de agua. Siga las instrucciones de la etiqueta del [place]. Tome sus dosis a intervalos regulares. No tome su medicamento con una frecuencia mayor a la indicada. Hable con su pediatra para informarse acerca del uso de este medicamento en nios. Aunque este medicamento se puede recetar a nios tan menores como de 10 aos de edad para condiciones selectivas, las precauciones se aplican. Sobredosis: Pngase en contacto inmediatamente con un centro toxicolgico o una sala de urgencia si usted cree que haya tomado demasiado medicamento. ATENCIN: [employer] es solo para usted.  No comparta este medicamento con nadie. Qu sucede si me olvido de una dosis? Si olvida una dosis, tmela lo antes posible. Si es casi la hora de su dosis siguiente, tome slo esa dosis. No tome dosis adicionales o dobles. Qu puede interactuar con este medicamento? No tome esta medicina con ninguno de los siguientes medicamentos: -dofetilida -gatifloxacino -ciertos agentes de contraste administrados antes de un procedimiento con rayos X, tomografas computadas (CT), MRI u otros procedimientos Muchos medicamentos pueden aumentar o reducir el nivel de Dispensing optician, tales como: -digoxina -diurticos -hormonas femeninas, como estrgenos, progestinas o pldoras anticonceptivas -isoniazida -medicamentos para presin sangunea, enfermedad cardiaca, pulso cardiaco irregular -morfina -cido nicotnico -fenotiazinas, tales como clorpromacina, mesoridazina, proclorperazina, tioridazina -fenitona -procainamida -quinidina -quinina -ranitidina -medicamentos esteroideos, como la prednisona o la cortisona -medicamentos estimulantes para trastornos de Freight forwarder, perder peso o mantenerse despierto -medicamentos tiroideos -trimetoprima -vancomicina Puede ser que esta lista no menciona todas las posibles interacciones. Informe a su profesional de [employer]	[place] de [employer] productos a base de hierbas, medicamentos de [place] o suplementos nutritivos que est tomando. Si usted fuma, consume bebidas alcohlicas o si utiliza drogas ilegales, indqueselo tambin a su profesional de [employer]	[place]. Algunas sustancias pueden interactuar con su medicamento. A qu debo estar atento al usar [employer]? Visite a su mdico o a su profesional de la salud para chequear su evolucin peridicamente. Un examen llamado HbA1C (A1C) ser monitoreado. Es un simple examen de [place]. Mide su control de azcar en la sangre durante los ltimos 2 a 3 meses. Usted recibir [employer] cada 3 a 6 meses. Aprenda cmo  controlar el nivel de azcar en la sangre. Aprenda a reconocer los sntomas de bajo y alto nivel de azcar en la sangre y cmo tratarlos.  Siempre lleve consigo una fuente rpida de azcar por si acaso experimenta sntomas de bajo nivel de azcar en la sangre. Ejemplos incluyen caramelos duros o tabletas de glucosa. Asegrese de que los miembros de su familia sepan que se puede ahogar si come o bebe mientras tiene sntomas graves de bajo nivel de azcar en la sangre, tales como convulsiones o prdida del conocimiento. Deben obtener ayuda mdica inmediatamente. Informe a su mdico o a su profesional de la salud si tiene alto nivel de Dispensing optician. Tal vez sea necesario cambiar la dosis de su medicamento. Si est enfermo o haciendo mucho ms ejercicio que el habitual, puede ser necesario cambiar la dosis de su medicamento. No se salte comidas. Pregunte a su mdico o a su profesional de la salud si debe evitar el consumo de alcohol. Muchos productos de venta libre para tos y resfros contienen azcar y alcohol. Estos pueden Magazine features editor de azcar en la sangre. Este medicamento puede provocar la ovulacin en mujeres premenopusicas que no tienen periodos menstruales regulares. Esto puede aumentar la posibilidad de [place]. No debe tomar este medicamento si se queda embarazada o si cree que est embarazada. Consulte a su mdico o su profesional de la salud sobre sus opciones anticonceptivas mientras est tomando [employer]. Si cree que est embarazada, consulte a su mdico o su profesional de la salud inmediatamente. Si va a someterse a una operacin, IRM (MRI), tomografa computarizada u otro procedimiento, informe a su mdico que est tomando [employer]. Usted podr necesitar dejar de tomar este medicamento antes del procedimiento. Use una pulsera o cadena de identificacin mdica. Lleve consigo una tarjeta de identificacin con informacin sobre su enfermedad y Scientist, research (medical) de sus  medicamentos y los horarios de las dosis. Qu efectos secundarios puedo tener al [employer] este medicamento? Efectos secundarios que debe informar a su mdico o a Barrister's clerk de la salud tan pronto como sea posible: -Chief of Staff como erupcin cutnea, picazn o urticarias, hinchazn de la cara, labios o lengua -problemas respiratorios -sensacin de desmayos o aturdimiento, cadas -dolores o molestias musculares -signos o sntomas de bajo nivel de azcar en la sangre tales como sentirse ansioso, confusin, mareos, aumento de apetito, debilidad o cansancio inusual, sudoracin, temblores, fro, irritabilidad, dolor de cabeza, visin borrosa, pulso cardaco rpido, prdida del conocimiento -pulso cardiaco irregular o lento -molestias o dolor de estmago inusual -cansancio o debilidad inusual Efectos secundarios que, por lo general, no requieren atencin mdica (debe informarlos a su mdico o a su profesional de la salud si persisten o si son molestos): -diarrea -dolor de cabeza Victorio Palm de estmago -sabor metlico en la boca -nuseas -molestias estomacales, gases Puede ser que esta lista no menciona todos los posibles efectos secundarios. Comunquese a su mdico por asesoramiento mdico [employer]. Usted puede informar los efectos secundarios a la FDA por telfono al 1-800-FDA-1088. Dnde debo guardar mi medicina? Mantngala fuera del alcance de los nios. Gurdela a [employer], entre 15 y 48 grados C (61 y 26 grados F). Protjala de la humedad y de Naval architect. Deseche todo el medicamento que no haya utilizado, despus de la fecha de vencimiento. ATENCIN: Este folleto es un resumen. Puede ser que no cubra toda la posible informacin. Si usted tiene preguntas acerca de esta medicina, consulte con su mdico, su farmacutico o su profesional de Technical sales engineer.  2015, Elsevier/Gold Standard. (2013-08-19 16:15:26)

## 2014-10-26 NOTE — Progress Notes (Signed)
Patient Discharge: Disposition: Patient discharged home. Education: Patient educated about follow-up appointments, medications, prescriptions, and discharge instructions on Metformin through interpreter. IV: Discontinued IV before discharge. Transportation: Patient transported in w/c with staff and daughter-in-law. Belongings: Patient took all her belongings with her.

## 2014-10-26 NOTE — Discharge Summary (Signed)
[facility] Discharge Summary  Patient name: Amy [name] Medical record number: [id number] Date of birth: 07-31-1956 Age: 59 y.o. Gender: female Date of Admission: 10/25/2014  Date of Discharge: 10/26/14 Admitting Physician: Dickie La, MD  Primary Care Provider: Beverlyn Roux, MD Consultants: None  Indication for Hospitalization: Hyperglycemia  Discharge Diagnoses/Problem List:  Diabetes Headache HTN Seizures  Disposition: Discharge Home  Discharge Condition: Stable  Brief Hospital Course:  Presented to ED on 1/11 with headache and found to be hypertensive and hyperglycemic to 400. A1C of 11.5. Slight anion gap of 16 noted without ketosis; gap improved to 3 prior to discharge. Two boluses of fluids given and received fluids overnight. Insulin sliding scale used throughout hospitalization. CT head showed no acute abnormality; demonstrated encephalomalacia in left MCA distribution.   Discharged with improvement of blood sugars, blood pressure, and headache.  Issues for Follow Up:  - Follow up BMP - Initiated on Metformin. Monitor for compliance and adverse effects. Check CBG. - Follow up Urine Culture - Follow up BP. Goal <140/90. Initiate medication if indicated.  Significant Procedures: None  Significant Labs and Imaging:   Recent Labs Lab 10/25/14 1929 10/25/14 2322  WBC 7.4  --   HGB 14.6 13.6  HCT 42.4 40.0  PLT 206  --     Recent Labs Lab 10/25/14 1929 10/25/14 2322 10/26/14 0608  NA 134* 140 134*  K 3.6 3.6 3.5  CL 101 106 108  CO2 17*  --  23  GLUCOSE 402* 310* 242*  BUN 8 8 7   CREATININE 0.69 0.60 0.51  CALCIUM 9.0  --  7.7*  ALKPHOS 103  --   --   AST 32  --   --   ALT 30  --   --   ALBUMIN 3.9  --   --   - A1C 11.5  Results/Tests Pending at Time of Discharge: Urine Culture  Discharge Medications:    Medication List    STOP taking these medications        glyBURIDE 2.5 MG tablet  Commonly known as:   DIABETA      TAKE these medications        levETIRAcetam 500 MG tablet  Commonly known as:  KEPPRA  Take 1 tablet (500 mg total) by mouth 2 (two) times daily.     metFORMIN 500 MG tablet  Commonly known as:  GLUCOPHAGE  Take 1 tablet (500 mg total) by mouth 2 (two) times daily with a meal.        Discharge Instructions: Please refer to Patient Instructions section of EMR for full details.  Patient was counseled important signs and symptoms that should prompt return to medical care, changes in medications, dietary instructions, activity restrictions, and follow up appointments.   Follow-Up Appointments:     Follow-up Information    Follow up with Kenn File, MD.   Specialty:  Family Medicine   Why:  @ 4:15pm for Hospital Follow Up   Contact information:   [place] [address] [place], [place] 10/26/2014, 9:47 PM PGY-1, [place]

## 2014-10-26 NOTE — ED Notes (Signed)
Patient up to bedside chair.  She is complaining of ongoing headache.  Patient given tylenol per prn orders.  Family remains at bedside

## 2014-10-27 LAB — URINE CULTURE

## 2014-10-28 ENCOUNTER — Ambulatory Visit (INDEPENDENT_AMBULATORY_CARE_PROVIDER_SITE_OTHER): Payer: Medicaid Other | Admitting: Family Medicine

## 2014-10-28 ENCOUNTER — Encounter: Payer: Self-pay | Admitting: Family Medicine

## 2014-10-28 VITALS — BP 157/87 | HR 98 | Temp 98.2°F | Wt 149.0 lb

## 2014-10-28 DIAGNOSIS — Z789 Other specified health status: Secondary | ICD-10-CM | POA: Insufficient documentation

## 2014-10-28 DIAGNOSIS — E119 Type 2 diabetes mellitus without complications: Secondary | ICD-10-CM

## 2014-10-28 DIAGNOSIS — I10 Essential (primary) hypertension: Secondary | ICD-10-CM

## 2014-10-28 DIAGNOSIS — F411 Generalized anxiety disorder: Secondary | ICD-10-CM

## 2014-10-28 MED ORDER — GLUCOSE BLOOD VI STRP: 1.0000 | ORAL_STRIP | Freq: Every day | Status: DC

## 2014-10-28 MED ORDER — LISINOPRIL 10 MG PO TABS: 10.0000 mg | ORAL_TABLET | Freq: Every day | ORAL | Status: DC

## 2014-10-28 NOTE — Assessment & Plan Note (Signed)
Patient states that over the last 2-3 weeks she's had persistent anxious feelings Discussed this with her and stated we would monitor her closely with close PCP follow-up If this continues, could consider hydroxyzine as anxiolytic versus starting SSRI , however after only 2-3 weeks of symptoms and hasn't had to start an SSRI at this time.

## 2014-10-28 NOTE — Assessment & Plan Note (Signed)
Uncontrolled hypertension, blood pressure 157/87 today Start low-dose lisinopril, 10 mg Renal function in the hospital was very good Consider recheck on follow-up with PCP in 2-3 weeks

## 2014-10-28 NOTE — Progress Notes (Signed)
Patient ID: Amy [name], female   DOB: 1955/11/08, 59 y.o.   MRN: [id number]   HPI  Visit conducted with assistance from [facility] interpreter [phone] via phone  Patient presents today for  Hospital follow-up  Patient was admitted on January 11 to the hospital with headache and hyperglycemia without DKA. Her previous A1c was well controlled on glyburide.   Her daughters are here with her today and state that she is tolerating metformin easily. She denies indigestion and diarrhea. Her CBGs have been running in the low 300's since getting home. She is watching her diet but does not understand that carbohydrates are associated with elevated blood sugars.   Headache Slight headache today, has been present off and on since getting home.   HTN No chest pain, dyspnea, palpitations, leg edema Not taking any meds  Anxious feelings States that it causes anxious feelings in her stomach, has been bothering her for 2-3 weeks without any inciting event or apparent causes.   Smoking status noted ROS: Per HPI  Objective: BP 157/87 mmHg  Pulse 98  Temp(Src) 98.2 F (36.8 C) (Oral)  Wt 149 lb (67.586 kg) Gen: NAD, alert, cooperative with exam , patient sitting in wheelchair  HEENT: NCAT , healed tracheostomy site CV: RRR, good S1/S2, no murmur Resp: CTABL, no wheezes, non-labored Ext: No edema, warm Neuro: Alert , nonverbal , communicates through  Pointing and signals to her relatives.  Assessment and plan:  Hypertension Uncontrolled hypertension, blood pressure 157/87 today Start low-dose lisinopril, 10 mg Renal function in the hospital was very good Consider recheck on follow-up with PCP in 2-3 weeks   DM type 2 (diabetes mellitus, type 2)  Uncontrolled diabetes which is in stark contrast to her previous disease state  tolerating metformin, titrate to full dose, 1 g twice a day  restart glyburide , previous dose was 1. 25 mg  Twice daily, restart at this dose and likely increase  with her PCP and follow-up  Follow-up 2-3 weeks   Anxiety state Patient states that over the last 2-3 weeks she's had persistent anxious feelings Discussed this with her and stated we would monitor her closely with close PCP follow-up If this continues, could consider hydroxyzine as anxiolytic versus starting SSRI , however after only 2-3 weeks of symptoms and hasn't had to start an SSRI at this time.   Language barrier to communication  Extended visit today due to language barrier  patient is aphasic as well as Spanish-speaking  discussed several topics , the most difficult to ascertain  Was her feelings of anxiety   also I think that she would benefit greatly from a diabetic diet education class , I'm not sure if this is available in [place].     Meds ordered this encounter  Medications  . glucose blood (ACCU-CHEK AVIVA) test strip    Sig: 1 each by Other route daily. Use as instructed    Dispense:  50 each    Refill:  11  . lisinopril (PRINIVIL,ZESTRIL) 10 MG tablet    Sig: Take 1 tablet (10 mg total) by mouth daily.    Dispense:  30 tablet    Refill:  3

## 2014-10-28 NOTE — Assessment & Plan Note (Signed)
Uncontrolled diabetes which is in stark contrast to her previous disease state  tolerating metformin, titrate to full dose, 1 g twice a day  restart glyburide , previous dose was 1. 25 mg  Twice daily, restart at this dose and likely increase with her PCP and follow-up  Follow-up 2-3 weeks

## 2014-10-28 NOTE — Assessment & Plan Note (Signed)
Extended visit today due to language barrier  patient is aphasic as well as Spanish-speaking  discussed several topics , the most difficult to ascertain  Was her feelings of anxiety   also I think that she would benefit greatly from a diabetic diet education class , I'm not sure if this is available in [place].

## 2014-10-28 NOTE — Patient Instructions (Signed)
   Increase you metformin to 2 pills twice daily.  Start taking 1.25 mg glyburide again (the half-pills you have)  Come back 2-3 weeks and write down your blood sugars.

## 2014-11-15 ENCOUNTER — Ambulatory Visit (INDEPENDENT_AMBULATORY_CARE_PROVIDER_SITE_OTHER): Payer: Medicaid Other | Admitting: Family Medicine

## 2014-11-15 ENCOUNTER — Encounter: Payer: Self-pay | Admitting: Family Medicine

## 2014-11-15 VITALS — BP 137/87 | HR 98 | Temp 97.7°F | Ht 63.0 in | Wt 144.0 lb

## 2014-11-15 DIAGNOSIS — E119 Type 2 diabetes mellitus without complications: Secondary | ICD-10-CM

## 2014-11-15 DIAGNOSIS — R569 Unspecified convulsions: Secondary | ICD-10-CM

## 2014-11-15 DIAGNOSIS — Z23 Encounter for immunization: Secondary | ICD-10-CM

## 2014-11-15 MED ORDER — GLYBURIDE 2.5 MG PO TABS: 2.5000 mg | ORAL_TABLET | Freq: Two times a day (BID) | ORAL | Status: DC

## 2014-11-15 MED ORDER — METFORMIN HCL 500 MG PO TABS: 1000.0000 mg | ORAL_TABLET | Freq: Two times a day (BID) | ORAL | Status: DC

## 2014-11-15 MED ORDER — LEVETIRACETAM 500 MG PO TABS: 500.0000 mg | ORAL_TABLET | Freq: Two times a day (BID) | ORAL | Status: DC

## 2014-11-15 NOTE — Patient Instructions (Signed)
Por favor, sigue tomando metformin 2 tabletas, 2 veces al dia. Tambien cambia glyburide a un tableta entera dos veces al dia  Nos vemos en 1 mes para chequear su diabetes y presion.  Su presion no es Theatre manager esta 90-200 sobre 50-120. Queremos que su presion este 100-140 sobre 106-90 la [employer].

## 2014-11-16 NOTE — Progress Notes (Signed)
   Subjective:    Patient ID: Amy [name], female    DOB: [birth year], 59 y.o.   MRN: [id number]  HPI CHRONIC DIABETES  Disease Monitoring  Blood Sugar Ranges: 160-210  Polyuria: no   Visual problems: no   Medication Compliance: yes  Medication Side Effects  Hypoglycemia: no   Preventitive Health Care  Eye Exam: due  Foot Exam: due  Diet pattern: reasonably well balanced, too much junk food when daughter-in-law not home  Exercise: minimal, post-stroke  Vaccines: update prevnar and tdap today  Patient was admitted for hyperglycemia last month, previously well controlled with just a whiff of glyburide but now A1c 11.5. Has discussed that she may need insulin but still wants to try to control without  Review of Systems See HPI    Objective:   Physical Exam  Constitutional: She is oriented to person, place, and time. She appears well-developed and well-nourished. No distress.  HENT:  Head: Normocephalic and atraumatic.  Eyes: Conjunctivae are normal. Right eye exhibits no discharge. Left eye exhibits no discharge. No scleral icterus.  Cardiovascular: Normal rate.   Pulmonary/Chest: Effort normal.  Abdominal: She exhibits no distension.  Musculoskeletal: She exhibits no edema.  Neurological: She is alert and oriented to person, place, and time.  Hemiparesis and aphasia at baseline  Skin: Skin is warm and dry. She is not diaphoretic.  Psychiatric: She has a normal mood and affect. Her behavior is normal.  Nursing note and vitals reviewed.         Assessment & Plan:

## 2014-11-16 NOTE — Assessment & Plan Note (Signed)
Dramatically increased A1c from 6.2 to 11.5 with hyperglycemia admission, added metformin at that time. Home CBGs 160-210 since that time - continue metformin 1000mg  bid - increase glyburide from 1.25mg  bid to 2.5mg  bid - f/u in 1 month to recheck A1c and discuss insulin if needed

## 2014-11-25 ENCOUNTER — Ambulatory Visit: Payer: Medicaid Other | Admitting: Neurology

## 2014-11-25 ENCOUNTER — Telehealth: Payer: Self-pay | Admitting: *Deleted

## 2014-11-25 NOTE — Telephone Encounter (Signed)
Patient no showed her appt today

## 2014-11-26 ENCOUNTER — Encounter: Payer: Self-pay | Admitting: Neurology

## 2014-12-31 ENCOUNTER — Ambulatory Visit (INDEPENDENT_AMBULATORY_CARE_PROVIDER_SITE_OTHER): Payer: Medicaid Other | Admitting: Neurology

## 2014-12-31 ENCOUNTER — Encounter: Payer: Self-pay | Admitting: Neurology

## 2014-12-31 VITALS — BP 132/88 | HR 82 | Ht 63.0 in | Wt 139.0 lb

## 2014-12-31 DIAGNOSIS — R569 Unspecified convulsions: Secondary | ICD-10-CM

## 2014-12-31 MED ORDER — LEVETIRACETAM 500 MG PO TABS: 500.0000 mg | ORAL_TABLET | Freq: Two times a day (BID) | ORAL | Status: DC

## 2014-12-31 NOTE — Progress Notes (Signed)
PATIENT: Amy [name] DOB: 06/20/1956  REASON FOR VISIT: routine follow up for seizure HISTORY FROM: daughter, patient with interpreter.  HISTORY OF PRESENT ILLNESS: Amy [name] is a 59 yo RH [place] Female, is accompanied by her daughter-in-law and interpreter at today's clinical visit.  Initial Visit May 2015: Patient herself, and her family, can only speaks Spanish, the history is through interpreter. She had past medical history of hypertension, diabetes, hyperlipidemia, depression, suffered a stroke in October 2014, presented with acute onset of language difficulty, expressive more than comprehensive aphasia, right hemiparesis, right arm more than leg, she was treated at medical clinic in [facility], she also had left craniotomy to control the swelling on the left side of her brain. She had suffered seizure during her hospital stay, first seizure was in October 2014, second one in December 2014, she was taking Dilantin, she has developed diffuse body rash, medications were switched to Levetiractam 500 mg one tablet twice a day, she has no recurrent seizure.    She apparently has very difficult hospital course, was intubated for one month, also had PEG tube placement, still receiving thin liquid through PEG tube, also had a tracheostomy.  She moved back from [place] to [place] on 2015, and she can sit in the wheelchair, ambulate with mild right leg weakness, mild gait difficulty, she can feed herself with her left hand, still has profound language difficulty, right hand monoparesis, there was no recurrent seizure.   I was not able to review   the outside medical record.   UPDATE March 18th 2016: Last visit was with Hoyle Sauer in March 31 2014, she was taking Keppra 500 mg twice a day, able to swallow by mouth's, no recurrent seizure,  She was admitted to hospital in Oct 26 2014 for headache and found to be hypertensive and hyperglycemic to 400. A1C of 11.5. Slight anion gap of 16  noted without ketosis; gap improved to 3 prior to discharge. Two boluses of fluids given and received fluids overnight. Insulin sliding scale used throughout hospitalization. CT head showed no acute abnormality; demonstrated encephalomalacia in left MCA distribution.  She came in today with her family, but none of them speak Spanish, limited history,  REVIEW OF SYSTEMS: Full 14 system review of systems performed and notable only for: As above  ALLERGIES: Allergies  Allergen Reactions  . Shrimp [Shellfish Allergy] Anaphylaxis  . Phenytoin Hives and Itching    PHYSICAL EXAM  Filed Vitals:   12/31/14 1201  BP: 132/88  Pulse: 82  Height: 5\' 3"  (1.6 m)  Weight: 139 lb (63.05 kg)   Body mass index is 24.63 kg/(m^2). No exam data present  PHYSICAL EXAMNIATION:  Gen: NAD, conversant, well nourised, obese, well groomed                     Cardiovascular: Regular rate rhythm, no peripheral edema, warm, nontender. Eyes: Conjunctivae clear without exudates or hemorrhage Neck: Supple, no carotid bruise. Pulmonary: Clear to auscultation bilaterally   NEUROLOGICAL EXAM:  MENTAL STATUS: Speech:    Speech is normal; fluent and spontaneous with normal comprehension.  Cognition:    The patient is oriented to person, place, and time;     recent and remote memory intact;     language fluent;     normal attention, concentration,     fund of knowledge.  CRANIAL NERVES: CN II: right hemi-visual field cut. Pupils are 4 mm and briskly reactive to light. Visual acuity is 20/20  bilaterally. CN III, IV, VI: extraocular movement are normal. No ptosis. CN V: Facial sensation is intact to pinprick in all 3 divisions bilaterally. Corneal responses are intact.  CN VII: she has right lower face weakness  CN VIII: Hearing is normal to rubbing fingers CN IX, X: Palate elevates symmetrically. Phonation is normal. CN XI: Head turning and shoulder shrug are intact CN XII: Tongue is midline with normal  movements and no atrophy.  MOTOR: Spastic right arm weakness, right arm proximal 4/5, right hand 4-/5, she has no significant right leg, left side weakness   REFLEXES: Reflexes are 2+ and symmetric at the biceps, triceps, knees, and ankles. Plantar responses are flexor.  SENSORY: Light touch, pinprick, position sense, and vibration sense are intact in fingers and toes.  COORDINATION: Rapid alternating movements and fine finger movements are intact. There is no dysmetria on finger-to-nose and heel-knee-shin. There are no abnormal or extraneous movements.   GAIT/STANCE: Right arm in elbow flexion, wrist flexion, finger flexion,     DIAGNOSTIC DATA (LABS, IMAGING, TESTING) - I reviewed patient records, labs, notes, testing and imaging myself where available.  Lab Results  Component Value Date   HGBA1C 11.5* 10/26/2014    ASSESSMENT AND PLAN Amy [name] is a 59 y.o. female with vascular risk factor of hypertension, hyperlipidemia, diabetes, poorly controlled, most recent A1c 11.5, presented with a left MCA hospital admission in [place], with residual aphagia, spastic right hemiparesis, she also had left craniotomy,  Hospital admission in January 2016 for  headache and found to be hypertensive and hyperglycemic to 400. A1C of 11.5. Slight anion gap of 16 noted without ketosis; gap improved to 3 prior to discharge. I have reviewed,  CT head showed no acute abnormality; demonstrated encephalomalacia in left MCA distribution.  1 she is at high risk for recurrent seizure, should complying with Keppra 500 mg twice a day 2. Daily aspirin  3. RTC in 4-6 months with Rhae Hammock, M.D. Ph.D.  [facility] [place], [place] [address] Phone: [phone] Fax:      [phone]

## 2015-02-09 ENCOUNTER — Ambulatory Visit (INDEPENDENT_AMBULATORY_CARE_PROVIDER_SITE_OTHER): Payer: Medicaid Other | Admitting: Family Medicine

## 2015-02-09 ENCOUNTER — Other Ambulatory Visit: Payer: Self-pay | Admitting: Family Medicine

## 2015-02-09 ENCOUNTER — Telehealth: Payer: Self-pay | Admitting: Family Medicine

## 2015-02-09 ENCOUNTER — Ambulatory Visit (HOSPITAL_COMMUNITY)
Admission: RE | Admit: 2015-02-09 | Discharge: 2015-02-09 | Disposition: A | Discharge status: Home / Self Care | Payer: Medicaid Other | Source: Ambulatory Visit | Attending: Family Medicine | Admitting: Family Medicine

## 2015-02-09 ENCOUNTER — Encounter: Payer: Self-pay | Admitting: Family Medicine

## 2015-02-09 VITALS — BP 116/75 | HR 94 | Temp 97.9°F | Ht 63.0 in | Wt 148.4 lb

## 2015-02-09 DIAGNOSIS — I69398 Other sequelae of cerebral infarction: Secondary | ICD-10-CM | POA: Insufficient documentation

## 2015-02-09 DIAGNOSIS — R569 Unspecified convulsions: Secondary | ICD-10-CM | POA: Diagnosis not present

## 2015-02-09 DIAGNOSIS — R208 Other disturbances of skin sensation: Secondary | ICD-10-CM

## 2015-02-09 DIAGNOSIS — R200 Anesthesia of skin: Secondary | ICD-10-CM

## 2015-02-09 DIAGNOSIS — E119 Type 2 diabetes mellitus without complications: Secondary | ICD-10-CM

## 2015-02-09 DIAGNOSIS — E785 Hyperlipidemia, unspecified: Secondary | ICD-10-CM | POA: Diagnosis not present

## 2015-02-09 DIAGNOSIS — I10 Essential (primary) hypertension: Secondary | ICD-10-CM

## 2015-02-09 LAB — COMPREHENSIVE METABOLIC PANEL
ALT: 29 U/L (ref 0–35)
AST: 23 U/L (ref 0–37)
Albumin: 3.9 g/dL (ref 3.5–5.2)
Alkaline Phosphatase: 89 U/L (ref 39–117)
BUN: 8 mg/dL (ref 6–23)
CO2: 25 mEq/L (ref 19–32)
Calcium: 9.4 mg/dL (ref 8.4–10.5)
Chloride: 102 mEq/L (ref 96–112)
Creat: 0.66 mg/dL (ref 0.50–1.10)
Glucose, Bld: 156 mg/dL — ABNORMAL HIGH (ref 70–99)
Potassium: 4.3 mEq/L (ref 3.5–5.3)
Sodium: 137 mEq/L (ref 135–145)
Total Bilirubin: 0.6 mg/dL (ref 0.3–1.2)
Total Protein: 6.9 g/dL (ref 6.0–8.3)

## 2015-02-09 LAB — CBC
HEMATOCRIT: 40.8 % (ref 36.0–46.0)
Hemoglobin: 13.9 g/dL (ref 12.0–15.0)
MCH: 31.5 pg (ref 26.0–34.0)
MCHC: 34.1 g/dL (ref 30.0–36.0)
MCV: 92.5 fL (ref 78.0–100.0)
PLATELETS: 242 10*3/uL (ref 150–400)
RBC: 4.41 MIL/uL (ref 3.87–5.11)
RDW: 13 % (ref 11.5–15.5)
WBC: 7.1 10*3/uL (ref 4.0–10.5)

## 2015-02-09 LAB — LIPID PANEL
Cholesterol: 204 mg/dL — ABNORMAL HIGH (ref 0–200)
HDL: 33 mg/dL — AB (ref >=46)
LDL CALC: 125 mg/dL — AB (ref 0–99)
Total CHOL/HDL Ratio: 6.2 Ratio
Triglycerides: 228 mg/dL — ABNORMAL HIGH (ref <150)
VLDL: 46 mg/dL — AB (ref 0–40)

## 2015-02-09 LAB — POCT GLYCOSYLATED HEMOGLOBIN (HGB A1C): Hemoglobin A1C: 7.5

## 2015-02-09 MED ORDER — ATORVASTATIN CALCIUM 40 MG PO TABS: 40.0000 mg | ORAL_TABLET | Freq: Every day | ORAL | Status: DC

## 2015-02-09 MED ORDER — LISINOPRIL 10 MG PO TABS: 10.0000 mg | ORAL_TABLET | Freq: Every day | ORAL | Status: DC

## 2015-02-09 MED ORDER — GLYBURIDE 2.5 MG PO TABS: 2.5000 mg | ORAL_TABLET | Freq: Three times a day (TID) | ORAL | Status: DC

## 2015-02-09 MED ORDER — GLYBURIDE 5 MG PO TABS: 5.0000 mg | ORAL_TABLET | Freq: Two times a day (BID) | ORAL | Status: DC

## 2015-02-09 MED ORDER — METFORMIN HCL 500 MG PO TABS: 1000.0000 mg | ORAL_TABLET | Freq: Two times a day (BID) | ORAL | Status: DC

## 2015-02-09 NOTE — Patient Instructions (Signed)
Sique tomando metformin, [employer], dos veces al dia.  Cambia el glyburide al dosis mas alto, dos veces al dia

## 2015-02-09 NOTE — Progress Notes (Signed)
   Subjective:    Patient ID: Amy [name], female    DOB: 09/04/1956, 59 y.o.   MRN: [id number]  HPI Pt presents for f/u of diabetes and new facial numbness.   Facial numbness was acute onset last Monday. Patient says she feels like the whole right side of her body is asleep. Her weakness is unchanged Her speech is chronically impaired and unchanged. She reports that the numbness/tingling got a little better after a few hours but she still feels it.  CHRONIC DIABETES  Disease Monitoring  Blood Sugar Ranges: usually 100-170 but 322 during incident above  Polyuria: no   Visual problems: no   Medication Compliance: yes  Medication Side Effects  Hypoglycemia: no    Review of Systems See HPI    Objective:   Physical Exam  Constitutional: She is oriented to person, place, and time. She appears well-developed and well-nourished. No distress.  HENT:  Head: Normocephalic and atraumatic.  Eyes: Conjunctivae are normal. Right eye exhibits no discharge. Left eye exhibits no discharge. No scleral icterus.  Cardiovascular: Normal rate.   Pulmonary/Chest: Effort normal. No respiratory distress.  Abdominal: She exhibits no distension.  Neurological: She is alert and oriented to person, place, and time. A sensory deficit is present. No cranial nerve deficit.  Chronic right paresis, new right paresthesias and decreased sensation  Skin: Skin is warm and dry. She is not diaphoretic.  Psychiatric: She has a normal mood and affect. Her behavior is normal.  Nursing note and vitals reviewed.         Assessment & Plan:

## 2015-02-09 NOTE — Assessment & Plan Note (Signed)
Significant improvement in A1c from last visit, 7.5 from 11.5 - continue metformin 1g bid - increase glyburide from 2.5 to 5mg  bid - f/u in 3 months

## 2015-02-09 NOTE — Assessment & Plan Note (Signed)
Right sided numbness/tingling starting last Monday (9 days ago), no new motor deficits, high risk of recurrent stroke - MRI/MRA today - cbc, cmp and lipid panel - refer to new neurologist (family unhappy with care at [facility]) - continue ASA, add lipitor - if new lesion on MRI will likely need carotid dopplers and echo

## 2015-02-09 NOTE — Telephone Encounter (Signed)
Authorization # was put in the workqueue, they didn't see it. i did call and give them the number. Deseree Kennon Holter, CMA

## 2015-02-09 NOTE — Telephone Encounter (Signed)
Needs authorization for patient's upcoming MRI / thanks Fonda Kinder, ASA

## 2015-03-18 ENCOUNTER — Ambulatory Visit (INDEPENDENT_AMBULATORY_CARE_PROVIDER_SITE_OTHER): Payer: Medicaid Other | Admitting: Neurology

## 2015-03-18 ENCOUNTER — Encounter: Payer: Self-pay | Admitting: Neurology

## 2015-03-18 VITALS — BP 120/70 | HR 98 | Ht 61.0 in | Wt 147.9 lb

## 2015-03-18 DIAGNOSIS — I69320 Aphasia following cerebral infarction: Secondary | ICD-10-CM

## 2015-03-18 DIAGNOSIS — R208 Other disturbances of skin sensation: Secondary | ICD-10-CM | POA: Diagnosis not present

## 2015-03-18 DIAGNOSIS — I69851 Hemiplegia and hemiparesis following other cerebrovascular disease affecting right dominant side: Secondary | ICD-10-CM | POA: Diagnosis not present

## 2015-03-18 DIAGNOSIS — R569 Unspecified convulsions: Secondary | ICD-10-CM | POA: Diagnosis not present

## 2015-03-18 DIAGNOSIS — I69351 Hemiplegia and hemiparesis following cerebral infarction affecting right dominant side: Secondary | ICD-10-CM

## 2015-03-18 DIAGNOSIS — R200 Anesthesia of skin: Secondary | ICD-10-CM

## 2015-03-18 NOTE — Progress Notes (Signed)
NEUROLOGY CONSULTATION NOTE  Amy [name] MRN: [id number] DOB: May 04, 1956  Referring provider: Dr. Sherril Cong Primary care provider: Dr. Sherril Cong  Reason for consult:  Left-sided facial numbness, history of stroke and seizures  HISTORY OF PRESENT ILLNESS: Amy [name] is a 59 year old right-handed Spanish-speaking woman from [place] with hypertension, diabetes, hyperlipidemia, depression, and stroke with seizure who presents for left facial numbness.  Records, labs, CT and MRI/MRA of head reviewed.  She is accompanied by her husband and daughter-in-law who provide some history.  An interpretor is present.  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  She was in the hospital for elevated blood pressure following a fall where she it her head.  During her hospitalization, she developed expressive greater than receptive aphasia and right hemiparesis and numbness of arm and leg.  She required a craniotomy due to brain swelling.  During her hospitalization, she was intubated for several days and had a PEG placement.   While in the hospital, she had a seizure in October and later another seizure in December.  She was initially on Dilantin which was later switched to Keppra due to rash.  She has had no recurrent seizures.  At baseline, she is aphasic, primarily expressive.  She has mild dysphagia.  She has right sided residual weakness.  She also has residual right sided numbness.  She moved to [place] last year and was followed at [facility].  She was hospitalized in January for headache and found to be hypertensive.  CT of head showed left MCA distribution encephalomalacia, but nothing acute.  Her glucose level was 400 and had a slight anion gap of 16 without ketoacidosis.   In April, she began experiencing new left sided numbness and tingling without new motor deficits.  MRI and MRA of head performed 02/09/15 showed no new infarct.  Labs from April include  Hgb A1c of 7.5 (from 11.5 in January) and lipid panel showing cholesterol of 204, HDL 33 and LDL of 125.  Her glyburide was increased and she was started on Lipitor.  The numbness has since resolved.  PAST MEDICAL HISTORY: Past Medical History  Diagnosis Date  . Hypertension   . Diabetes mellitus   . Hypercholesteremia   . GERD (gastroesophageal reflux disease)   . Thyroid disease   . Stroke   . Dysphagia   . Disorder of vocal cord   . Brain injury     HX OF TRAUMATIC BRAIN INJURY    PAST SURGICAL HISTORY: Past Surgical History  Procedure Laterality Date  . Cesarean section    . Gastrostomy tube placement    . Removal of gastrostomy tube      MEDICATIONS: Current Outpatient Prescriptions on File Prior to Visit  Medication Sig Dispense Refill  . atorvastatin (LIPITOR) 40 MG tablet Take 1 tablet (40 mg total) by mouth daily. 90 tablet 3  . glucose blood (ACCU-CHEK AVIVA) test strip 1 each by Other route daily. Use as instructed 50 each 11  . glyBURIDE (DIABETA) 5 MG tablet Take 1 tablet (5 mg total) by mouth 2 (two) times daily with a meal. 180 tablet 3  . levETIRAcetam (KEPPRA) 500 MG tablet Take 1 tablet (500 mg total) by mouth 2 (two) times daily. 180 tablet 3  . lisinopril (PRINIVIL,ZESTRIL) 10 MG tablet Take 1 tablet (10 mg total) by mouth daily. 90 tablet 3  . metFORMIN (GLUCOPHAGE) 500 MG tablet Take 2 tablets (1,000 mg total) by mouth 2 (  two) times daily with a meal. 360 tablet 3   No current facility-administered medications on file prior to visit.    ALLERGIES: Allergies  Allergen Reactions  . Shrimp [Shellfish Allergy] Anaphylaxis  . Phenytoin Hives and Itching    FAMILY HISTORY: Family History  Problem Relation Age of Onset  . Heart Problems Father     SOCIAL HISTORY: History   Social History  . Marital Status: Married    Spouse Name: N/A  . Number of Children: 5  . Years of Education: N/A   Occupational History  . Not on file.   Social History  Main Topics  . Smoking status: Never Smoker   . Smokeless tobacco: Never Used  . Alcohol Use: No  . Drug Use: No  . Sexual Activity: Not on file   Other Topics Concern  . Not on file   Social History Narrative   Patient lives at home with her husband and daughter in law.          REVIEW OF SYSTEMS: Constitutional: No fevers, chills, or sweats, no generalized fatigue, change in appetite Eyes: No visual changes, double vision, eye pain Ear, nose and throat: No hearing loss, ear pain, nasal congestion, sore throat Cardiovascular: No chest pain, palpitations Respiratory:  No shortness of breath at rest or with exertion, wheezes GastrointestinaI: No nausea, vomiting, diarrhea, abdominal pain, fecal incontinence Genitourinary:  No dysuria, urinary retention or frequency Musculoskeletal:  No neck pain, back pain Integumentary: No rash, pruritus, skin lesions Neurological: as above Psychiatric: No depression, insomnia, anxiety Endocrine: No palpitations, fatigue, diaphoresis, mood swings, change in appetite, change in weight, increased thirst Hematologic/Lymphatic:  No anemia, purpura, petechiae. Allergic/Immunologic: no itchy/runny eyes, nasal congestion, recent allergic reactions, rashes  PHYSICAL EXAM: Filed Vitals:   03/18/15 1002  BP: 120/70  Pulse: 98   General: No acute distress Head:  Normocephalic/atraumatic Eyes:  fundi unremarkable, without vessel changes, exudates, hemorrhages or papilledema. Neck: supple, no paraspinal tenderness, full range of motion Back: No paraspinal tenderness Heart: regular rate and rhythm Lungs: Clear to auscultation bilaterally. Vascular: No carotid bruits. Neurological Exam: Mental status: alert and oriented to person, place, and time, recent and remote memory intact, fund of knowledge intact, attention and concentration intact, speech fluent and not dysarthric, difficulty with naming and repeating, able to follow commands Cranial  nerves: CN I: not tested CN II: pupils equal, round and reactive to light, right field cut, fundi unremarkable, without vessel changes, exudates, hemorrhages or papilledema. CN III, IV, VI:  full range of motion, no nystagmus, no ptosis CN V: facial sensation intact CN VII: right lower facial weakness CN VIII: hearing intact CN IX, X: gag intact, uvula midline CN XI: sternocleidomastoid and trapezius muscles intact CN XII: tongue midline Bulk & Tone: mildly increased tone in right upper extremity.. Motor:  4+/5 right arm and grip, 5-/5 right proximal leg.  Otherwise, 5/5. Sensation:  Pinprick and vibration intact Deep Tendon Reflexes:  3+ right upper and lower extremities.  2+ on left.  Toes downgoing Finger to nose testing:  No dysmetria Heel to shin:  No dysmetria Gait:  Slight right limp. Romberg negative.  IMPRESSION: Left facial numbness, resolved.  I have no explanation for this.  There is no new infarct on MRI.  It may be residual symptoms from prior stroke.   History of left MCA stroke.   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  Thank you for allowing me to take part in the care of this patient.  Metta Clines, DO  CC: Beverlyn Roux, MD

## 2015-03-18 NOTE — Patient Instructions (Addendum)
Continuar la aspirina y Lipitor . Continuar 500 mg dos veces al da levetiracetam Recibir los registros de [employer] de 6 meses .

## 2015-05-03 ENCOUNTER — Telehealth: Payer: Self-pay | Admitting: Family Medicine

## 2015-05-03 NOTE — Telephone Encounter (Signed)
Patient's Daughter in [place] is very concerned because the Patient received a letter stating she needs a Pap and Mamogram. Patient's Family have tried to get an appointment since two weeks ago because the Patient's health situation. Please, contact Patient's son Jahniya Duzan at [phone].

## 2015-05-04 NOTE — Telephone Encounter (Signed)
I now have a schedule for August. Please get her scheduled for that first week. Thanks

## 2015-05-04 NOTE — Telephone Encounter (Signed)
Family wants to see [facility]o but PCP with no openings and scheduled not released for August. Will forward to PCP to advise.

## 2015-06-14 ENCOUNTER — Other Ambulatory Visit: Payer: Self-pay | Admitting: Family Medicine

## 2015-06-14 DIAGNOSIS — I69359 Hemiplegia and hemiparesis following cerebral infarction affecting unspecified side: Secondary | ICD-10-CM

## 2015-07-05 ENCOUNTER — Ambulatory Visit: Payer: Medicaid Other | Admitting: Nurse Practitioner

## 2015-07-14 ENCOUNTER — Ambulatory Visit (INDEPENDENT_AMBULATORY_CARE_PROVIDER_SITE_OTHER): Payer: Medicaid Other | Admitting: Family Medicine

## 2015-07-14 ENCOUNTER — Encounter: Payer: Self-pay | Admitting: Family Medicine

## 2015-07-14 VITALS — BP 127/74 | HR 90 | Temp 98.2°F | Ht 63.0 in | Wt 144.3 lb

## 2015-07-14 DIAGNOSIS — M2550 Pain in unspecified joint: Secondary | ICD-10-CM

## 2015-07-14 LAB — COMPREHENSIVE METABOLIC PANEL
ALT: 28 U/L (ref 6–29)
AST: 16 U/L (ref 10–35)
Albumin: 4.4 g/dL (ref 3.6–5.1)
Alkaline Phosphatase: 101 U/L (ref 33–130)
BUN: 15 mg/dL (ref 7–25)
CHLORIDE: 97 mmol/L — AB (ref 98–110)
CO2: 29 mmol/L (ref 20–31)
Calcium: 9.8 mg/dL (ref 8.6–10.4)
Creat: 0.75 mg/dL (ref 0.50–0.99)
GLUCOSE: 166 mg/dL — AB (ref 65–99)
POTASSIUM: 4.3 mmol/L (ref 3.5–5.3)
Sodium: 136 mmol/L (ref 135–146)
TOTAL PROTEIN: 7.1 g/dL (ref 6.1–8.1)
Total Bilirubin: 0.6 mg/dL (ref 0.2–1.2)

## 2015-07-14 LAB — C-REACTIVE PROTEIN

## 2015-07-14 LAB — POCT SEDIMENTATION RATE: POCT SED RATE: 19 mm/hr (ref 0–22)

## 2015-07-14 LAB — CK: Total CK: 52 U/L (ref 7–177)

## 2015-07-14 MED ORDER — ROSUVASTATIN CALCIUM 20 MG PO TABS: 20.0000 mg | ORAL_TABLET | Freq: Every day | ORAL | Status: DC

## 2015-07-14 NOTE — Progress Notes (Signed)
Spanish interpreter utilized during today's visit.   HPI:  Pt presents for a same day appointment to discuss joint pain.  Thinks this is related to starting statin earlier this year after a stroke. Pain is in her shoulders, hip, knees.  Occurs intermittently but also throughout the day. Worse over last 2 months. No swelling, erythema, fevers, rashes. Current medications include lipitor 40mg  daily, metformin, lisinopril, keppra, glyburide. Her family provides the history as patient is aphasic after her stroke. Some muscle discomfort but primarily joint related.  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 Alert and appears to follow along with conversation. Near total paralysis of RUE. Grip on L 4/5. Good ROM of left shoulder. Ext: no knee effusions or warmth.  ASSESSMENT/PLAN:  1. Joint pains - patient believes these are related to taking atorvastatin. No associated symptoms to raise concern for rheumatologic etiology, but history is limited by patient's aphasia and language barrier so will proceed with lab work to evaluate further. - CMET, CK, ANA, sed rate, CRP today - stop atorvastatin, start crestor 20mg  daily as may have less intolerance - follow up with PCP in 3 weeks  FOLLOW UP: F/u in 3 weeks for joint pains  [place] J. Ardelia Mems, [place]

## 2015-07-14 NOTE — Patient Instructions (Signed)
Stop atorvastatin Start rosuvastatin Getting labs Follow up in 3 weeks.  Be well, Dr. Ardelia Mems

## 2015-07-15 ENCOUNTER — Telehealth: Payer: Self-pay | Admitting: *Deleted

## 2015-07-15 LAB — ANA: ANA: NEGATIVE

## 2015-07-15 NOTE — Telephone Encounter (Signed)
Prior Authorization received from [facility] pharmacy for Crestor. Formulary and PA form placed in provider box for completion. Martin, Tamika L, RN  

## 2015-07-18 NOTE — Telephone Encounter (Signed)
Received PA approval for Crestor via [place] [employer].  Med approved for 07/18/15 - 07/17/16.  [facility] pharmacy informed.  PA approval number [id number]. Derl Barrow, RN

## 2015-07-21 ENCOUNTER — Encounter: Payer: Self-pay | Admitting: Family Medicine

## 2015-08-02 ENCOUNTER — Telehealth: Payer: Self-pay | Admitting: *Deleted

## 2015-08-02 ENCOUNTER — Ambulatory Visit: Payer: Medicaid Other | Admitting: Neurology

## 2015-08-02 NOTE — Telephone Encounter (Signed)
Pt's son called to cancel her appt this am.

## 2015-08-03 ENCOUNTER — Encounter: Payer: Self-pay | Admitting: Neurology

## 2015-08-03 DIAGNOSIS — Z0289 Encounter for other administrative examinations: Secondary | ICD-10-CM

## 2015-08-04 ENCOUNTER — Ambulatory Visit: Payer: Medicaid Other | Admitting: Family Medicine

## 2015-09-19 ENCOUNTER — Encounter: Payer: Self-pay | Admitting: Neurology

## 2015-09-19 ENCOUNTER — Other Ambulatory Visit (INDEPENDENT_AMBULATORY_CARE_PROVIDER_SITE_OTHER): Payer: Medicaid Other

## 2015-09-19 ENCOUNTER — Ambulatory Visit (INDEPENDENT_AMBULATORY_CARE_PROVIDER_SITE_OTHER): Payer: Medicaid Other | Admitting: Neurology

## 2015-09-19 VITALS — BP 130/72 | HR 88 | Wt 145.0 lb

## 2015-09-19 DIAGNOSIS — E119 Type 2 diabetes mellitus without complications: Secondary | ICD-10-CM

## 2015-09-19 DIAGNOSIS — I69398 Other sequelae of cerebral infarction: Secondary | ICD-10-CM | POA: Diagnosis not present

## 2015-09-19 DIAGNOSIS — I69320 Aphasia following cerebral infarction: Secondary | ICD-10-CM | POA: Diagnosis not present

## 2015-09-19 DIAGNOSIS — I69359 Hemiplegia and hemiparesis following cerebral infarction affecting unspecified side: Secondary | ICD-10-CM

## 2015-09-19 DIAGNOSIS — R569 Unspecified convulsions: Secondary | ICD-10-CM | POA: Diagnosis not present

## 2015-09-19 DIAGNOSIS — E785 Hyperlipidemia, unspecified: Secondary | ICD-10-CM

## 2015-09-19 DIAGNOSIS — I10 Essential (primary) hypertension: Secondary | ICD-10-CM

## 2015-09-19 DIAGNOSIS — Z794 Long term (current) use of insulin: Secondary | ICD-10-CM

## 2015-09-19 LAB — LIPID PANEL
CHOLESTEROL: 99 mg/dL (ref 0–200)
HDL: 33.8 mg/dL — AB (ref >39.00)
LDL Cholesterol: 31 mg/dL (ref 0–99)
NonHDL: 65.31
Total CHOL/HDL Ratio: 3
Triglycerides: 171 mg/dL — ABNORMAL HIGH (ref 0.0–149.0)
VLDL: 34.2 mg/dL (ref 0.0–40.0)

## 2015-09-19 NOTE — Progress Notes (Signed)
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  An interpretor is present.  Family provides some history as well.  UPDATE: She takes Keppra 500mg  twice daily for seizure prevention.  For secondary stroke prevention, she takes ASA and rosuvastatin 20mg .  She is doing well.  No seizure.  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  She was in the hospital for elevated blood pressure following a fall where she it her head.  During her hospitalization, she developed expressive greater than receptive aphasia and right hemiparesis and numbness of arm and leg.  She required a craniotomy due to brain swelling.  During her hospitalization, she was intubated for several days and had a PEG placement.   While in the hospital, she had a seizure in October and later another seizure in December.  She was initially on Dilantin which was later switched to Keppra due to rash.  She has had no recurrent seizures.  At baseline, she is aphasic, primarily expressive.  She has mild dysphagia.  She has right sided residual weakness.  She also has residual right sided numbness.  She moved to [place] last year and was followed at [facility].  She was hospitalized in January for headache and found to be hypertensive.  CT of head showed left MCA distribution encephalomalacia, but nothing acute.  Her glucose level was 400 and had a slight anion gap of 16 without ketoacidosis.   In April, she began experiencing new left sided numbness and tingling without new motor deficits.  MRI and MRA of head performed 02/09/15 showed no new infarct.  Labs from April include Hgb A1c of 7.5 (from 11.5 in January) and  lipid panel showing cholesterol of 204, HDL 33 and LDL of 125.  Her glyburide was increased and she was started on Lipitor.  The numbness has since resolved.  PAST MEDICAL HISTORY: Past Medical History  Diagnosis Date  . Hypertension   . Diabetes mellitus   . Hypercholesteremia   . GERD (gastroesophageal reflux disease)   . Thyroid disease   . Stroke ([place])   . Dysphagia   . Disorder of vocal cord   . Brain injury ([place])     HX OF TRAUMATIC BRAIN INJURY    MEDICATIONS: Current Outpatient Prescriptions on File Prior to Visit  Medication Sig Dispense Refill  . glyBURIDE (DIABETA) 5 MG tablet Take 1 tablet (5 mg total) by mouth 2 (two) times daily with a meal. 180 tablet 3  . lisinopril (PRINIVIL,ZESTRIL) 10 MG tablet Take 1 tablet (10 mg total) by mouth daily. 90 tablet 3  . glucose blood (ACCU-CHEK AVIVA) test strip 1 each by Other route daily. Use as instructed 50 each 11  . levETIRAcetam (KEPPRA) 500 MG tablet Take 1 tablet (500 mg total) by mouth 2 (two) times daily. 180 tablet 3  . levETIRAcetam (KEPPRA) 500 MG tablet TAKE ONE TABLET BY MOUTH TWICE DAILY 180 tablet 3  . metFORMIN (GLUCOPHAGE) 500 MG tablet Take 2 tablets (1,000 mg total) by mouth 2 (two) times daily with a meal. 360 tablet 3  . rosuvastatin (CRESTOR) 20 MG tablet Take 1 tablet (20 mg total) by mouth daily. 90 tablet 3   No current facility-administered medications on file prior  to visit.    ALLERGIES: Allergies  Allergen Reactions  . Shrimp [Shellfish Allergy] Anaphylaxis  . Phenytoin Hives and Itching    FAMILY HISTORY: Family History  Problem Relation Age of Onset  . Heart Problems Father     SOCIAL HISTORY: Social History   Social History  . Marital Status: Married    Spouse Name: N/A  . Number of Children: 5  . Years of Education: N/A   Occupational History  . Not on file.   Social History Main Topics  . Smoking status: Never Smoker   . Smokeless tobacco: Never Used  . Alcohol Use: No    . Drug Use: No  . Sexual Activity: Not on file   Other Topics Concern  . Not on file   Social History Narrative   Patient lives at home with her husband and daughter in law.          REVIEW OF SYSTEMS: Constitutional: No fevers, chills, or sweats, no generalized fatigue, change in appetite Eyes: No visual changes, double vision, eye pain Ear, nose and throat: No hearing loss, ear pain, nasal congestion, sore throat Cardiovascular: No chest pain, palpitations Respiratory:  No shortness of breath at rest or with exertion, wheezes GastrointestinaI: No nausea, vomiting, diarrhea, abdominal pain, fecal incontinence Genitourinary:  No dysuria, urinary retention or frequency Musculoskeletal:  No neck pain, back pain Integumentary: No rash, pruritus, skin lesions Neurological: as above Psychiatric: No depression, insomnia, anxiety Endocrine: No palpitations, fatigue, diaphoresis, mood swings, change in appetite, change in weight, increased thirst Hematologic/Lymphatic:  No anemia, purpura, petechiae. Allergic/Immunologic: no itchy/runny eyes, nasal congestion, recent allergic reactions, rashes  PHYSICAL EXAM: Filed Vitals:   09/19/15 0801  BP: 130/72  Pulse: 88   General: No acute distress.   Head:  Normocephalic/atraumatic Eyes:  Fundoscopic exam unremarkable without vessel changes, exudates, hemorrhages or papilledema. Neck: supple, no paraspinal tenderness, full range of motion Heart:  Regular rate and rhythm Lungs:  Clear to auscultation bilaterally Back: No paraspinal tenderness Neurological Exam: alert and oriented to person, place, and time. Attention span and concentration intact, recent and remote memory intact, fund of knowledge intact.  Speech fluent and not dysarthric, expressive aphasia.  right lower facial weakness.  Decreased right V1 distribution.  Otherwrise, CN II-XII intact. Fundoscopic exam unremarkable without vessel changes, exudates, hemorrhages or  papilledema.  Increased right sided tone. 5- right deltoid, 4+ right triceps, right grip plegic, 5-/5 right proximal leg.  Otherwise, 5/5. Marland Kitchen  Sensation to light touch decreased on right.  Deep tendon reflexes 3+ right upper and lower extremities.  2+ on left., toes downgoing.  Finger to nose and heel to shin testing intact.  Slight right limp, Romberg negative.  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  Will check fasting lipid panel (LDL goal should be less than 70) Check carotid doppler Continue Keppra 500mg  twice daily Continue BP control Glycemic control Follow up in 6 months.  15 minutes spent face to face with patient, over 50% spent discussing management and prognosis.  Metta Clines, DO  CC:  Beverlyn Roux, MD

## 2015-09-19 NOTE — Patient Instructions (Signed)
Comprobaremos un lpido en ayunas y un doppler carotdeo Vamos a tratar de obtener sus registros de [place], T[place] de [place]. Contine su Keppra 500 mg [place] en 6 meses

## 2015-09-23 ENCOUNTER — Other Ambulatory Visit: Payer: Self-pay | Admitting: Neurology

## 2015-09-26 ENCOUNTER — Ambulatory Visit
Admission: RE | Admit: 2015-09-26 | Discharge: 2015-09-26 | Disposition: A | Discharge status: Home / Self Care | Payer: Medicaid Other | Source: Ambulatory Visit | Attending: Neurology | Admitting: Neurology

## 2015-09-26 ENCOUNTER — Telehealth: Payer: Self-pay

## 2015-09-26 DIAGNOSIS — I69398 Other sequelae of cerebral infarction: Secondary | ICD-10-CM

## 2015-09-26 DIAGNOSIS — R569 Unspecified convulsions: Secondary | ICD-10-CM

## 2015-09-26 DIAGNOSIS — I69320 Aphasia following cerebral infarction: Secondary | ICD-10-CM

## 2015-09-26 NOTE — Telephone Encounter (Signed)
Spoke with son. (Pt spanish speaking) Results were relayed. Son verbalized understanding.

## 2015-09-26 NOTE — Telephone Encounter (Signed)
-----   Message from Pieter Partridge, DO sent at 09/26/2015  2:46 PM EST ----- Carotid ultrasound looks okay

## 2015-11-08 ENCOUNTER — Ambulatory Visit (INDEPENDENT_AMBULATORY_CARE_PROVIDER_SITE_OTHER): Payer: Medicaid Other | Admitting: Family Medicine

## 2015-11-08 ENCOUNTER — Encounter: Payer: Self-pay | Admitting: Family Medicine

## 2015-11-08 VITALS — BP 153/72 | HR 104 | Temp 97.7°F | Wt 145.3 lb

## 2015-11-08 DIAGNOSIS — Z794 Long term (current) use of insulin: Secondary | ICD-10-CM | POA: Diagnosis not present

## 2015-11-08 DIAGNOSIS — I69320 Aphasia following cerebral infarction: Secondary | ICD-10-CM

## 2015-11-08 DIAGNOSIS — E119 Type 2 diabetes mellitus without complications: Secondary | ICD-10-CM | POA: Diagnosis present

## 2015-11-08 DIAGNOSIS — R569 Unspecified convulsions: Secondary | ICD-10-CM

## 2015-11-08 LAB — POCT GLYCOSYLATED HEMOGLOBIN (HGB A1C): Hemoglobin A1C: 8.2

## 2015-11-08 MED ORDER — LEVETIRACETAM 500 MG PO TABS: 500.0000 mg | ORAL_TABLET | Freq: Two times a day (BID) | ORAL | Status: DC

## 2015-11-08 MED ORDER — GLYBURIDE 5 MG PO TABS: 10.0000 mg | ORAL_TABLET | Freq: Two times a day (BID) | ORAL | Status: DC

## 2015-11-11 NOTE — Progress Notes (Signed)
Subjective:   Amy [name] is a 60 y.o. female with a history of hemorrhagic stroke, dm here for f/u of both.  Patient wishes to travel with husband back to [place] and family has investigated drugs available there. I was able to check their finds and confirm they are exact matches for all meds she is prescribed.   They are also concerned because her speech and mobility seem to improve significantly while she is receiving physical and speech therapy but stall or even regress when those services end. Family is frustrated they have been unable to get her ongoing therapy services despite social work assistance.  CHRONIC DIABETES  Disease Monitoring  Blood Sugar Ranges: Doesn't check regularly, A1c 7-11 over the past year  Polyuria: no   Visual problems: no   Medication Compliance: yes  Medication Side Effects  Hypoglycemia: no   Preventitive Health Care  Eye Exam: due  Foot Exam: due  Diet pattern: varied, lots of carbs  Exercise: minimal except when receiving physical therapy which has been cut off  Review of Systems:  Per HPI. All other systems reviewed and are negative.   PMH, PSH, Medications, Allergies, and FmHx reviewed and updated in EMR.  Social History: never smoker  Objective:  BP 153/72 mmHg  Pulse 104  Temp(Src) 97.7 F (36.5 C) (Oral)  Wt 145 lb 4.8 oz (65.908 kg)  Gen:  60 y.o. female in NAD HEENT: NCAT, MMM, EOMI, PERRL, anicteric sclerae CV: RRR, no MRG, no JVD Resp: Non-labored, CTAB, no wheezes noted Abd: Soft, NTND, BS present, no guarding or organomegaly Ext: WWP, no edema MSK: Full ROM, strength intact Neuro: Alert and oriented, speech normal      Chemistry      Component Value Date/Time   NA 136 07/14/2015 1154   K 4.3 07/14/2015 1154   CL 97* 07/14/2015 1154   CO2 29 07/14/2015 1154   BUN 15 07/14/2015 1154   CREATININE 0.75 07/14/2015 1154   CREATININE 0.51 10/26/2014 0608      Component Value Date/Time   CALCIUM 9.8 07/14/2015  1154   ALKPHOS 101 07/14/2015 1154   AST 16 07/14/2015 1154   ALT 28 07/14/2015 1154   BILITOT 0.6 07/14/2015 1154      Lab Results  Component Value Date   WBC 7.1 02/09/2015   HGB 13.9 02/09/2015   HCT 40.8 02/09/2015   MCV 92.5 02/09/2015   PLT 242 02/09/2015   No results found for: TSH Lab Results  Component Value Date   HGBA1C 8.2 11/08/2015   Assessment & Plan:     Amy [name] is a 60 y.o. female here for dm and stroke f/u  DM type 2 (diabetes mellitus, type 2) A1c 8.2, up from 7.6 last time. Goal <8 given patient frailty - continue metformin 1g bid - increase glyburide to 10mg  bid - f/u in 1 month  Aphasia as late effect of stroke Aphasia and hemiplegia 2/2 stroke several years ago, improves with therapy but regresses when services cut off - discussed options with social work, unable to get medicaid because she did not work in the [place] to qualify for full social security benefits - social work (zac brookes) to continue looking for options - will also discuss path to citizenship or other options with [facility] to determine whether she has any other options - encouraged family to push her to do and say as much as possible and not waite on her unless she is truly unable  Seizures Refilled keppra, has neuro f/u in April      Beverlyn Roux, MD, MPH [facility] PGY-3 11/11/2015 9:25 AM

## 2015-11-11 NOTE — Assessment & Plan Note (Signed)
A1c 8.2, up from 7.6 last time. Goal <8 given patient frailty - continue metformin 1g bid - increase glyburide to 10mg  bid - f/u in 1 month

## 2015-11-11 NOTE — Assessment & Plan Note (Signed)
Refilled keppra, has neuro f/u in April

## 2015-11-11 NOTE — Assessment & Plan Note (Signed)
Aphasia and hemiplegia 2/2 stroke several years ago, improves with therapy but regresses when services cut off - discussed options with social work, unable to get medicaid because she did not work in the [place] to qualify for full social security benefits - social work (zac brookes) to continue looking for options - will also discuss path to citizenship or other options with [facility] to determine whether she has any other options - encouraged family to push her to do and say as much as possible and not waite on her unless she is truly unable

## 2016-01-11 ENCOUNTER — Other Ambulatory Visit: Payer: Self-pay | Admitting: Family Medicine

## 2016-01-11 DIAGNOSIS — I10 Essential (primary) hypertension: Secondary | ICD-10-CM

## 2016-03-22 ENCOUNTER — Ambulatory Visit: Payer: Medicaid Other | Admitting: Neurology

## 2016-06-05 ENCOUNTER — Other Ambulatory Visit: Payer: Self-pay | Admitting: *Deleted

## 2016-06-09 MED ORDER — METFORMIN HCL 500 MG PO TABS: 1000.0000 mg | ORAL_TABLET | Freq: Two times a day (BID) | ORAL | 3 refills | Status: DC

## 2016-06-20 ENCOUNTER — Ambulatory Visit: Payer: Medicaid Other | Admitting: Family Medicine

## 2016-06-22 ENCOUNTER — Ambulatory Visit: Payer: Medicaid Other | Admitting: Neurology

## 2016-07-12 ENCOUNTER — Encounter: Payer: Self-pay | Admitting: Internal Medicine

## 2016-07-12 ENCOUNTER — Ambulatory Visit (INDEPENDENT_AMBULATORY_CARE_PROVIDER_SITE_OTHER): Payer: Medicaid Other | Admitting: Internal Medicine

## 2016-07-12 VITALS — BP 123/69 | HR 83 | Temp 97.9°F | Ht 63.0 in | Wt 141.4 lb

## 2016-07-12 DIAGNOSIS — Z23 Encounter for immunization: Secondary | ICD-10-CM

## 2016-07-12 DIAGNOSIS — E785 Hyperlipidemia, unspecified: Secondary | ICD-10-CM | POA: Diagnosis not present

## 2016-07-12 DIAGNOSIS — I619 Nontraumatic intracerebral hemorrhage, unspecified: Secondary | ICD-10-CM | POA: Diagnosis not present

## 2016-07-12 DIAGNOSIS — E118 Type 2 diabetes mellitus with unspecified complications: Secondary | ICD-10-CM

## 2016-07-12 DIAGNOSIS — I10 Essential (primary) hypertension: Secondary | ICD-10-CM

## 2016-07-12 LAB — COMPLETE METABOLIC PANEL WITH GFR
ALBUMIN: 4.4 g/dL (ref 3.6–5.1)
ALK PHOS: 71 U/L (ref 33–130)
ALT: 37 U/L — ABNORMAL HIGH (ref 6–29)
AST: 22 U/L (ref 10–35)
BILIRUBIN TOTAL: 0.5 mg/dL (ref 0.2–1.2)
BUN: 12 mg/dL (ref 7–25)
CO2: 25 mmol/L (ref 20–31)
CREATININE: 0.76 mg/dL (ref 0.50–0.99)
Calcium: 9.5 mg/dL (ref 8.6–10.4)
Chloride: 104 mmol/L (ref 98–110)
GFR, EST NON AFRICAN AMERICAN: 85 mL/min (ref >=60)
GLUCOSE: 226 mg/dL — AB (ref 65–99)
Potassium: 4.2 mmol/L (ref 3.5–5.3)
Sodium: 138 mmol/L (ref 135–146)
TOTAL PROTEIN: 6.9 g/dL (ref 6.1–8.1)

## 2016-07-12 LAB — POCT GLYCOSYLATED HEMOGLOBIN (HGB A1C): Hemoglobin A1C: 8.2

## 2016-07-12 LAB — LIPID PANEL
CHOLESTEROL: 89 mg/dL — AB (ref 125–200)
HDL: 34 mg/dL — ABNORMAL LOW (ref >=46)
LDL CALC: 33 mg/dL (ref <130)
TRIGLYCERIDES: 109 mg/dL (ref <150)
Total CHOL/HDL Ratio: 2.6 Ratio (ref <=5.0)
VLDL: 22 mg/dL (ref <30)

## 2016-07-12 NOTE — Progress Notes (Signed)
.  hfnote

## 2016-07-12 NOTE — Patient Instructions (Signed)
Ms. Liebmann,  It was very nice to meet you today. I will call with lab results and possibilties for further rehab.  I may suggest medication changes based on your labs today.  Best, Dr. Pamelia Hoit,  [place] un placer conocerte hoy. Voy a llamar con los resultados de laboratorio y possibilties para ms rehabilitacin.  Puedo sugerir cambios de medicamentos basados ??en sus laboratorios de hoy.  Mejor, Dr. Ola Spurr

## 2016-07-12 NOTE — Progress Notes (Signed)
[name] Progress Note  Subjective:  Amy [name] is a 60-y/o female who presents with family for follow-up of deficits after stroke. Visit assisted by Spanish Video interpreter Will [phone]). Family members -- daughter-in-law, daughter, and husband spoke on behalf of patient with her consent, as aphasic.   Stroke:  - Had left MCA stroke in 2014 with residual right spastic hemiplegia and expressive aphasia - Also had left craniotomy due to swelling on the brain during initial hospitalization; had seizures, now controlled on Keppra 500 mg BID. Follows with [facility]; last seen 12/31/14 - She is right-handed and said she has had improvement in grip with PT/OT. Last seen at [facility] (? Family unsure) clinic for PT but could not continue therapy, as they were told they would need to stop receiving Medicaid - Family notes she has improvement in speech when she works with Speech Therapy but has not been able to work with anyone for over a year because insurance would not cover it. They are especially concerned because her mood gets low when she cannot speak. She is not a [place] citizen, and family says this also limits what services she can receive.  - Taking a daily aspirin ROS: No worsening of weakness  HLD: - Taking crestor 20 mg daily  HTN: - Taking lisinopril 10 mg daily ROS: No chest pain  T2DM: - Was to increase glyburide from 2.5 mg BID to 5 mg BID after 10/2015 office visit, but family said she is taking 2.5 mg BID - Taking metformin 1000 mg BID ROS: No abdominal pain, no diarrhea  Objective: Blood pressure 123/69, pulse 83, temperature 97.9 F (36.6 C), temperature source Oral, height 5\' 3"  (1.6 m), weight 141 lb 6.4 oz (64.1 kg). Body mass index is 25.05 kg/m. Constitutional: Overweight female, in NAD HENT: L anterior skull depressed (s/p craniotomy) Cardiovascular: RRR, S1, S2, no m/r/g.  Pulmonary/Chest: Effort normal and breath sounds normal.  No respiratory distress.  Neurological: LE strength 5/5. Grip strength 1/5 of R hand. Decreased sensation of R face and R upper extremity (chronic). Deficits of CNV and VII on right evident.  Skin: Skin is warm and dry. No rash noted. No erythema.  Psychiatric: Occasionally tearful while others described difficulty in her accessing rehab services.   Vitals reviewed  Assessment/Plan: Hemorrhagic stroke - Placed referrals for PT, OT, and Speech, as it has been over 1 year since patient has accessed these services and because patient has improved function and quality of life when receiving these services. Will need to continue to stress limits of number of Medicaid therapy sessions and need to practice exercises at home.  - Patient was supposed to follow-up with Neurology last year. Recommended calling for appointment.   Hyperlipidemia - Check lipid panel to ensure adequate statin therapy - Check CMP to check LFTs  Hypertension - BP well-controlled on lisinopril 10 mg daily - Checking CMP today  DM type 2 (diabetes mellitus, type 2) - Hgb A1c 8.2, which is same as 8 months ago - Increase glyburide to 5 mg BID, as suggested at last visit  Follow-up in about 1 month to discuss mood, ensure referrals have worked out.  Olene Floss, MD [place], PGY-2

## 2016-07-13 ENCOUNTER — Telehealth: Payer: Self-pay | Admitting: Internal Medicine

## 2016-07-13 DIAGNOSIS — Z8673 Personal history of transient ischemic attack (TIA), and cerebral infarction without residual deficits: Secondary | ICD-10-CM

## 2016-07-13 DIAGNOSIS — I6930 Unspecified sequelae of cerebral infarction: Secondary | ICD-10-CM

## 2016-07-13 DIAGNOSIS — R29898 Other symptoms and signs involving the musculoskeletal system: Secondary | ICD-10-CM

## 2016-07-13 DIAGNOSIS — I69328 Other speech and language deficits following cerebral infarction: Secondary | ICD-10-CM

## 2016-07-13 NOTE — Telephone Encounter (Signed)
Left message for family that patient should qualify for Medicaid-covered therapy services (speech, PT, OT) since it has been over 1 year since she has accessed services. Placed referrals.

## 2016-07-15 MED ORDER — ROSUVASTATIN CALCIUM 20 MG PO TABS: 20.0000 mg | ORAL_TABLET | Freq: Every day | ORAL | 3 refills | Status: DC

## 2016-07-15 NOTE — Assessment & Plan Note (Signed)
-   Placed referrals for PT, OT, and Speech, as it has been over 1 year since patient has accessed these services and because patient has improved function and quality of life when receiving these services. Will need to continue to stress limits of number of Medicaid therapy sessions and need to practice exercises at home.  - Patient was supposed to follow-up with Neurology last year. Recommended calling for appointment.

## 2016-07-15 NOTE — Assessment & Plan Note (Signed)
-   Check lipid panel to ensure adequate statin therapy - Check CMP to check LFTs

## 2016-07-15 NOTE — Assessment & Plan Note (Signed)
-   Hgb A1c 8.2, which is same as 8 months ago - Increase glyburide to 5 mg BID, as suggested at last visit

## 2016-07-15 NOTE — Assessment & Plan Note (Signed)
-   BP well-controlled on lisinopril 10 mg daily - Checking CMP today

## 2016-07-17 ENCOUNTER — Telehealth: Payer: Self-pay | Admitting: Family Medicine

## 2016-07-17 NOTE — Telephone Encounter (Signed)
Patient's son called back asking for referral's details. I send message to Referrals Coordinator. Waiting for information.

## 2016-07-18 ENCOUNTER — Ambulatory Visit (INDEPENDENT_AMBULATORY_CARE_PROVIDER_SITE_OTHER): Payer: Medicaid Other | Admitting: Neurology

## 2016-07-18 ENCOUNTER — Encounter: Payer: Self-pay | Admitting: Neurology

## 2016-07-18 VITALS — BP 130/76 | HR 79 | Wt 147.0 lb

## 2016-07-18 DIAGNOSIS — I69320 Aphasia following cerebral infarction: Secondary | ICD-10-CM

## 2016-07-18 DIAGNOSIS — R569 Unspecified convulsions: Secondary | ICD-10-CM | POA: Diagnosis not present

## 2016-07-18 DIAGNOSIS — I69359 Hemiplegia and hemiparesis following cerebral infarction affecting unspecified side: Secondary | ICD-10-CM

## 2016-07-18 MED ORDER — ASPIRIN EC 81 MG PO TBEC: 81.0000 mg | DELAYED_RELEASE_TABLET | Freq: Every day | ORAL | 11 refills | Status: DC

## 2016-07-18 MED ORDER — LEVETIRACETAM 500 MG PO TABS: 500.0000 mg | ORAL_TABLET | Freq: Two times a day (BID) | ORAL | 0 refills | Status: DC

## 2016-07-18 MED ORDER — LEVETIRACETAM 500 MG PO TABS: 500.0000 mg | ORAL_TABLET | Freq: Two times a day (BID) | ORAL | 3 refills | Status: DC

## 2016-07-18 NOTE — Progress Notes (Signed)
Chart forwarded.  

## 2016-07-18 NOTE — Patient Instructions (Addendum)
1.  Continue levetiracetam 500mg  twice daily 2.  Start aspirin EC 81mg  daily 3.  Follow up in one year.  1. Siga el levetiracetam 500mg  [place] 2. Comenzar la aspirina EC 81mg  diario 3. Seguimiento en un ao.

## 2016-07-18 NOTE — Progress Notes (Signed)
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  An interpretor is present.  Family provides some history as well.  UPDATE: She takes Keppra 500mg  twice daily for seizure prevention.  For secondary stroke prevention, she takes osuvastatin 20mg .  She has not been taking ASA.  Lipid panel from 09/19/15 showed total cholesterol 99, HDL 33.80 and LDL 31.  Carotid doppler from 09/26/15 showed no hemodynamically significant stenosis.  Hgb A1c from 11/08/15 was 8.2.  She has not had any seizures.  HISTORY: She had a left malignant MCA stroke in [place] in October 2014.  She was in the hospital for elevated blood pressure following a fall where she it her head.  During her hospitalization, she developed expressive greater than receptive aphasia and right hemiparesis and numbness of arm and leg.  She required a craniotomy due to brain swelling.  During her hospitalization, she was intubated for several days and had a PEG placement.   While in the hospital, she had a seizure in October and later another seizure in December.  She was initially on Dilantin which was later switched to Keppra due to rash.  She has had no recurrent seizures.  At baseline, she is aphasic, primarily expressive.  She has mild dysphagia.  She has right sided residual weakness.  She also has residual right sided numbness.  She was hospitalized in January 2016 for headache and found to be hypertensive.  CT of head showed left MCA distribution encephalomalacia, but nothing acute.  Her glucose level was 400 and had a slight anion gap of 16 without ketoacidosis.   In April, she began experiencing new left sided numbness and tingling without new motor deficits.  MRI and  MRA of head performed 02/09/15 showed no new infarct.  Labs from April include Hgb A1c of 7.5 (from 11.5 in January) and lipid panel showing cholesterol of 204, HDL 33 and LDL of 125.  Her glyburide was increased and she was started on Lipitor.  The numbness has since resolved.  PAST MEDICAL HISTORY: Past Medical History:  Diagnosis Date  . Brain injury ([place])    HX OF TRAUMATIC BRAIN INJURY  . Diabetes mellitus   . Disorder of vocal cord   . Dysphagia   . GERD (gastroesophageal reflux disease)   . Hypercholesteremia   . Hypertension   . Stroke ([place])   . Thyroid disease     MEDICATIONS: Current Outpatient Prescriptions on File Prior to Visit  Medication Sig Dispense Refill  . glucose blood (ACCU-CHEK AVIVA) test strip 1 each by Other route daily. Use as instructed 50 each 11  . glyBURIDE (DIABETA) 5 MG tablet Take 2 tablets (10 mg total) by mouth 2 (two) times daily with a meal. 360 tablet 3  . lisinopril (PRINIVIL,ZESTRIL) 10 MG tablet TAKE ONE TABLET BY MOUTH ONCE DAILY 90 tablet 3  . metFORMIN (GLUCOPHAGE) 500 MG tablet Take 2 tablets (1,000 mg total) by mouth 2 (two) times daily with a meal. 360 tablet 3  . rosuvastatin (CRESTOR) 20 MG tablet Take 1 tablet (20 mg total) by mouth daily. 90 tablet 3   No current facility-administered medications on file prior to visit.     ALLERGIES: Allergies  Allergen Reactions  . Shrimp [Shellfish Allergy] Anaphylaxis  . Phenytoin Hives  and Itching    FAMILY HISTORY: Family History  Problem Relation Age of Onset  . Heart Problems Father     SOCIAL HISTORY: Social History   Social History  . Marital status: Married    Spouse name: N/A  . Number of children: 5  . Years of education: N/A   Occupational History  . Not on file.   Social History Main Topics  . Smoking status: Never Smoker  . Smokeless tobacco: Never Used  . Alcohol use No  . Drug use: No  . Sexual activity: Not on file   Other Topics Concern  . Not on file     Social History Narrative   Patient lives at home with her husband and daughter in law.          REVIEW OF SYSTEMS: Constitutional: No fevers, chills, or sweats, no generalized fatigue, change in appetite Eyes: No visual changes, double vision, eye pain Ear, nose and throat: No hearing loss, ear pain, nasal congestion, sore throat Cardiovascular: No chest pain, palpitations Respiratory:  No shortness of breath at rest or with exertion, wheezes GastrointestinaI: No nausea, vomiting, diarrhea, abdominal pain, fecal incontinence Genitourinary:  No dysuria, urinary retention or frequency Musculoskeletal:  No neck pain, back pain Integumentary: No rash, pruritus, skin lesions Neurological: as above Psychiatric: No depression, insomnia, anxiety Endocrine: No palpitations, fatigue, diaphoresis, mood swings, change in appetite, change in weight, increased thirst Hematologic/Lymphatic:  No purpura, petechiae. Allergic/Immunologic: no itchy/runny eyes, nasal congestion, recent allergic reactions, rashes  PHYSICAL EXAM: Vitals:   07/18/16 0852  BP: 130/76  Pulse: 79   General: No acute distress.  Patient appears well-groomed.  normal body habitus. Head:  Normocephalic/atraumatic Eyes:  Fundi examined but not visualized Neck: supple, no paraspinal tenderness, full range of motion Heart:  Regular rate and rhythm Lungs:  Clear to auscultation bilaterally Back: No paraspinal tenderness Neurological Exam: alert and oriented to person, place, and time. Attention span and concentration intact, recent and remote memory intact, fund of knowledge intact.  Speech fluent and not dysarthric, expressive aphasia.  right lower facial weakness.  Decreased right V1-V2 distribution.  Otherwrise, CN II-XII intact. Increased right sided tone. 5- right deltoid, 4+ right triceps, right grip plegic, 5-/5 right proximal leg.  Otherwise, 5/5. Marland Kitchen  Sensation to light touch decreased on right.  Deep tendon reflexes 3+  right upper and lower extremities.  2+ on left., toes downgoing.  Finger to nose and heel to shin testing intact.  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  Continue Keppra 500mg  twice daily 2.  Instructed patient and family that she should start taking ASA 81mg  daily for secondary stroke prevention. 3.  Continue blood pressure and glycemic control as managed by PCP 4.  Continue statin therapy as managed by PCP (LDL goal should be less than 70.  Last year, it was 14, which is likely too low.  I would recommend keeping it over 40). 5.  Follow up in one year  27 minutes spent face to face with patient, over 50% spent counseling.  Metta Clines, DO  CC:  Lovenia Kim, MD

## 2016-07-19 NOTE — Telephone Encounter (Signed)
Verdis Frederickson, outpatient neuro rehab has been trying to get in touch with them to set up appointment. Can you give the son a callback and give him this number [phone]. It is for them to call and set up an appointment

## 2016-07-20 ENCOUNTER — Ambulatory Visit: Payer: Medicaid Other | Admitting: Physical Therapy

## 2016-07-25 ENCOUNTER — Ambulatory Visit: Payer: Medicaid Other | Attending: Internal Medicine

## 2016-07-25 ENCOUNTER — Ambulatory Visit: Payer: Medicaid Other | Admitting: Occupational Therapy

## 2016-07-25 DIAGNOSIS — I69120 Aphasia following nontraumatic intracerebral hemorrhage: Secondary | ICD-10-CM | POA: Insufficient documentation

## 2016-07-25 DIAGNOSIS — R482 Apraxia: Secondary | ICD-10-CM

## 2016-07-25 NOTE — Patient Instructions (Signed)
Pick some one or two syllable words that would be helpful for Amy [name] to say, along with 2-3 word phrases. We will work on these during therapy.  Say the word/phrase with Amy [name], then when she gets better with that Have her repeat the word/phrase, then when she gets better with that Have her say the word/phrase herself

## 2016-07-25 NOTE — Therapy (Signed)
[facility] [address] [place], [place], [address] Phone: [phone]   Fax:  [phone]  Speech Language Pathology Evaluation  Patient Details  Name: Amy [name] MRN: [id number] Date of Birth: 08/01/1956 Referring Provider: Olene Floss, M.D.  Encounter Date: 07/25/2016      End of Session - 07/25/16 1703    Visit Number 1   Number of Visits 4   Date for SLP Re-Evaluation 09/14/16   Authorization Type medicaid   SLP Start Time [id number]   SLP Stop Time  1622   SLP Time Calculation (min) 47 min   Activity Tolerance Patient tolerated treatment well      Past Medical History:  Diagnosis Date  . Brain injury ([place])    HX OF TRAUMATIC BRAIN INJURY  . Diabetes mellitus   . Disorder of vocal cord   . Dysphagia   . GERD (gastroesophageal reflux disease)   . Hypercholesteremia   . Hypertension   . Stroke ([place])   . Thyroid disease     Past Surgical History:  Procedure Laterality Date  . CESAREAN SECTION    . GASTROSTOMY TUBE PLACEMENT    . REMOVAL OF GASTROSTOMY TUBE      There were no vitals filed for this visit.      Subjective Assessment - 07/25/16 1605    Subjective CVA in 2014. Pt with approx 5 home health therapy ST visits in 2015.   Patient is accompained by: Family member;Interpreter  husband, son; interpreter Elinor Parkinson   Currently in Pain? No/denies            SLP Evaluation [facility] - 07/25/16 1605      SLP Visit Information   SLP Received On 07/25/16   Referring Provider Olene Floss, M.D.   Onset Date 2014   Medical Diagnosis S/P CVA     Prior Functional Status   Cognitive/Linguistic Baseline Within functional limits   Available Support Family     Auditory Comprehension   Overall Auditory Comprehension Appears within functional limits for tasks assessed     Expression   Primary Mode of Expression Nonverbal - gestures     Verbal Expression   Overall Verbal  Expression Impaired   Automatic Speech --  No automatic speech   Repetition Impaired   Level of Impairment Word level   Non-Verbal Means of Communication Gestures   Other Verbal Expression Comments Difficult to fully assess given severity of pt's apraxia of speech     Motor Speech   Overall Motor Speech Impaired   Articulation Impaired   Level of Impairment Word   Intelligibility Intelligibility reduced   Word 0-24% accurate   Phrase 0-24% accurate   Sentence 0-24% accurate   Motor Planning Impaired   Level of Impairment Word   Motor Speech Errors Aware;Groping for words;Consistent   Interfering Components --  time post onset   Effective Techniques --  simultaneous production helpful in repeated syllable "mama"                         SLP Education - 07/25/16 1702    Education provided Yes   Education Details apraxia of speech, aphasia, Medicaid allowances for ST, home tasks   Person(s) Educated Patient;Child(ren);Spouse   Methods Explanation;Demonstration   Comprehension Verbalized understanding            SLP Long Term Goals - 07/25/16 1707      SLP LONG TERM GOAL #1  Title pt will benefit from husband's appropriate cues during 2 ST sessions   Baseline assessment of husband's cues has not taken place   Time 4   Period Weeks   Status New     SLP LONG TERM GOAL #2   Title pt will simultaneously produce one syllable words functional to her environment with 30% success   Baseline <20% success   Time 4   Period Weeks   Status New     SLP LONG TERM GOAL #3   Title pt will use a functional multi-modal communication system to improve expressive communication with husband/family   Baseline pt gestured indistinctly during ST eval   Time 4   Period Weeks   Status New          Plan - 07/25/16 1704    Clinical Impression Statement Pt presents today with 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 Pt's family was instructed on how to initially work with pt at home. Pt would benefit from skilled ST to incr pt independence and decr caregiver burden.   Speech Therapy Frequency 2x / week   Duration --  8 weeks recommended, however pt would like to be seen per [facility] regulations and does not wish self-pay status   Treatment/Interventions SLP instruction and feedback;Internal/external aids;Multimodal communcation approach;Language facilitation;Functional tasks;Patient/family education;Cueing hierarchy   Potential to Achieve Goals Fair   Potential Considerations Severity of impairments   Consulted and Agree with Plan of Care Patient;Family member/caregiver   Family Member Consulted husband      Patient will benefit from skilled therapeutic intervention in order to improve the following deficits and impairments:   Apraxia of speech  Aphasia following nontraumatic intracerebral hemorrhage    Problem List Patient Active Problem List   Diagnosis Date Noted  . 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) ([place]) 09/19/2015  . Right facial numbness 02/09/2015  . Anxiety state 10/28/2014  . Language barrier to communication 10/28/2014  . Cephalalgia   . Change in vision 03/02/2014  . Seizures ([place]) 02/24/2014  . Hemorrhagic stroke ([place]) 02/16/2014  . Hypertension 02/16/2014  . DM type 2 (diabetes mellitus, type 2) ([place]) 02/16/2014  . Aphasia as late effect of stroke 02/16/2014  . Paralysis ([place]) 02/16/2014  . Hyperlipidemia 02/16/2014    [facility] ,[place], CCC-SLP  07/25/2016, 5:18 PM  [facility] [address] [place], [place], [address] Phone: [phone]   Fax:  [phone]  Name: Amy [name] MRN: [id number] Date of Birth: Aug 25, 1956

## 2016-08-02 ENCOUNTER — Ambulatory Visit: Payer: Medicaid Other | Admitting: Physical Therapy

## 2016-08-10 ENCOUNTER — Ambulatory Visit: Payer: Medicaid Other

## 2016-08-16 ENCOUNTER — Ambulatory Visit (INDEPENDENT_AMBULATORY_CARE_PROVIDER_SITE_OTHER): Payer: Medicaid Other | Admitting: Family Medicine

## 2016-08-16 ENCOUNTER — Encounter: Payer: Self-pay | Admitting: Family Medicine

## 2016-08-16 VITALS — BP 114/67 | HR 81 | Temp 98.3°F | Ht 63.0 in | Wt 140.8 lb

## 2016-08-16 DIAGNOSIS — R569 Unspecified convulsions: Secondary | ICD-10-CM

## 2016-08-16 DIAGNOSIS — E785 Hyperlipidemia, unspecified: Secondary | ICD-10-CM

## 2016-08-16 DIAGNOSIS — Z23 Encounter for immunization: Secondary | ICD-10-CM

## 2016-08-16 DIAGNOSIS — E119 Type 2 diabetes mellitus without complications: Secondary | ICD-10-CM

## 2016-08-16 DIAGNOSIS — I10 Essential (primary) hypertension: Secondary | ICD-10-CM | POA: Diagnosis not present

## 2016-08-16 MED ORDER — LEVETIRACETAM 500 MG PO TABS: 500.0000 mg | ORAL_TABLET | Freq: Two times a day (BID) | ORAL | 0 refills | Status: DC

## 2016-08-16 MED ORDER — LISINOPRIL 10 MG PO TABS: 10.0000 mg | ORAL_TABLET | Freq: Every day | ORAL | 3 refills | Status: DC

## 2016-08-16 MED ORDER — ACCU-CHEK AVIVA PLUS W/DEVICE KIT: PACK | 0 refills | Status: AC

## 2016-08-16 MED ORDER — GLUCOSE BLOOD VI STRP: 1.0000 | ORAL_STRIP | Freq: Every day | 10 refills | Status: DC

## 2016-08-16 MED ORDER — METFORMIN HCL 500 MG PO TABS: 1000.0000 mg | ORAL_TABLET | Freq: Two times a day (BID) | ORAL | 3 refills | Status: DC

## 2016-08-16 MED ORDER — ROSUVASTATIN CALCIUM 20 MG PO TABS: 20.0000 mg | ORAL_TABLET | Freq: Every day | ORAL | 3 refills | Status: DC

## 2016-08-16 MED ORDER — GLYBURIDE 5 MG PO TABS: 10.0000 mg | ORAL_TABLET | Freq: Two times a day (BID) | ORAL | 3 refills | Status: DC

## 2016-08-16 NOTE — Progress Notes (Signed)
Subjective:   Patient ID: Amy [name]    DOB: May 22, 1956, 60 y.o. female   MRN: [id number]  CC: follow up on T2DM  HPI: Amy [name] is a 60 y.o. female who presents to clinic today for follow up on her diabetes.  Presents with her husband and daughter.   History obtained using Spanish video interpreter ID# [id number].  Family members spoke for the patient as she has aphasia s/p CVA in 2014.   Patient states she has not been checking her sugars regularly at home for the past 2 months because does not have a glucometer.  Used to check blood sugars using a friend's glucometer but that friend has recently moved away.  Denies symptoms of hypoglycemia, dizziness, numbness, tingling. Denies abdominal pain, diarrhea.   Is aware that her blood sugars could be better controlled.  At home taking Glyburide 5 mg BID and Metformin 1000 mg BID.     Diet - Patient has been eating greens, not too much pasta or meat.  Never drinks soda.  Does drink a lot of water.  Tries to exercise, enjoys walking but needs to be supervised due to her CVA in 2014 which left her with some weakness.  Does not report any residual weakness in legs but does endorse right arm weakness.  Only uses left arm.   Due for Pneumovax shot today. Patient agreed.    ROS: See HPI for pertinent ROS.  [place]: Pertinent past medical, surgical, family, and social history were reviewed and updated as appropriate. Smoking status reviewed.  Medications reviewed. Current Outpatient Prescriptions  Medication Sig Dispense Refill  . aspirin EC 81 MG tablet Take 1 tablet (81 mg total) by mouth daily. 30 tablet 11  . Blood Glucose Monitoring Suppl (ACCU-CHEK AVIVA PLUS) w/Device KIT Please use to check blood sugars once a day. 1 kit 0  . glucose blood (ACCU-CHEK AVIVA) test strip 1 each by Other route daily. Use as instructed 100 each 10  . glyBURIDE (DIABETA) 5 MG tablet Take 2 tablets (10 mg total) by mouth 2 (two) times daily with a meal. 360  tablet 3  . levETIRAcetam (KEPPRA) 500 MG tablet Take 1 tablet (500 mg total) by mouth 2 (two) times daily. 180 tablet 0  . lisinopril (PRINIVIL,ZESTRIL) 10 MG tablet Take 1 tablet (10 mg total) by mouth daily. 90 tablet 3  . metFORMIN (GLUCOPHAGE) 500 MG tablet Take 2 tablets (1,000 mg total) by mouth 2 (two) times daily with a meal. 360 tablet 3  . rosuvastatin (CRESTOR) 20 MG tablet Take 1 tablet (20 mg total) by mouth daily. 90 tablet 3   No current facility-administered medications for this visit.    Objective:   BP 114/67   Pulse 81   Temp 98.3 F (36.8 C) (Oral)   Ht '5\' 3"'$  (1.6 m)   Wt 140 lb 12.8 oz (63.9 kg)   BMI 24.94 kg/m  Vitals and nursing note reviewed.  General: well nourished, well developed, in no acute distress with non-toxic appearance.  Aphasic due to stroke HEENT: NCAT, MMM, oropharynx clear Neck: supple, non-tender without LAD CV: RRR, no MRG Lungs: CTA B/L with normal work of breathing Abdomen: soft, non-tender, no masses or organomegaly palpable, +bs Skin: warm, dry, no rashes or lesions, cap refill < 2 seconds Extremities: warm and well perfused, normal tone Neuro: Aphasic.  Interacts well with provider, decreased strength of right arm 2/5.  Left upper extremity 5/5 strength.   Decreased sensation over R face.  Psych: nonverbal, mood appropriate  Assessment & Plan:   60 yo F presents to clinic for follow up on her T2DM.   DM type 2 (diabetes mellitus, type 2) -Currently taking Glyburide 5 mg BID and Metformin 1000 mg BID - Hgb A1c 8.2 on 9/28 not at goal -Goal <7.5  -Continue current medication regimen as it was recently increased at last visit from 2.5 mg glyburide BID to 5 mg BID.  - Ordered glucometer and test strips today.  Patient instructed to check blood sugar once daily.  - Discussed lifestyle modifications that can help control blood sugars - Diabetic foot exam performed in clinic today (documented) -Given refills on medications as [place] -Continue to monitor  Health Maintenance: - Pneumovax shot administered - Blood work from 9/28 with mild elevation of ALT but otherwise unremarkable.   Orders Placed This Encounter  Procedures  . Pneumococcal polysaccharide vaccine 23-valent greater than or equal to 2yo subcutaneous/IM   Meds ordered this encounter  Medications  . glucose blood (ACCU-CHEK AVIVA) test strip    Sig: 1 each by Other route daily. Use as instructed    Dispense:  100 each    Refill:  10  . Blood Glucose Monitoring Suppl (ACCU-CHEK AVIVA PLUS) w/Device KIT    Sig: Please use to check blood sugars once a day.    Dispense:  1 kit    Refill:  0  . glyBURIDE (DIABETA) 5 MG tablet    Sig: Take 2 tablets (10 mg total) by mouth 2 (two) times daily with a meal.    Dispense:  360 tablet    Refill:  3  . levETIRAcetam (KEPPRA) 500 MG tablet    Sig: Take 1 tablet (500 mg total) by mouth 2 (two) times daily.    Dispense:  180 tablet    Refill:  0  . lisinopril (PRINIVIL,ZESTRIL) 10 MG tablet    Sig: Take 1 tablet (10 mg total) by mouth daily.    Dispense:  90 tablet    Refill:  3  . metFORMIN (GLUCOPHAGE) 500 MG tablet    Sig: Take 2 tablets (1,000 mg total) by mouth 2 (two) times daily with a meal.    Dispense:  360 tablet    Refill:  3  . rosuvastatin (CRESTOR) 20 MG tablet    Sig: Take 1 tablet (20 mg total) by mouth daily.    Dispense:  90 tablet    Refill:  3   Follow up : 3 months or sooner as needed   Lovenia Kim, MD [place], PGY-1 08/16/2016 11:24 PM

## 2016-08-16 NOTE — Assessment & Plan Note (Addendum)
-  Currently taking Glyburide 5 mg BID and Metformin 1000 mg BID - Hgb A1c 8.2 on 9/28 not at goal -Goal <7.5  -Continue current medication regimen as it was recently increased at last visit from 2.5 mg glyburide BID to 5 mg BID.  - Ordered glucometer and test strips today.  Patient instructed to check blood sugar once daily.  - Discussed lifestyle modifications that can help control blood sugars - Diabetic foot exam performed in clinic today (documented) -Given refills on medications as she will be [place] of the [place] next 3 months -Continue to monitor

## 2016-08-20 ENCOUNTER — Ambulatory Visit: Payer: Medicaid Other | Attending: Family Medicine | Admitting: *Deleted

## 2016-08-20 DIAGNOSIS — R482 Apraxia: Secondary | ICD-10-CM | POA: Diagnosis present

## 2016-08-20 DIAGNOSIS — I69120 Aphasia following nontraumatic intracerebral hemorrhage: Secondary | ICD-10-CM | POA: Diagnosis present

## 2016-08-20 NOTE — Therapy (Signed)
[facility] [address] [place], [place], [address] Phone: [phone]   Fax:  [phone]  Speech Language Pathology Treatment  Patient Details  Name: Amy [name] MRN: [id number] Date of Birth: September 25, 1956 Referring Provider: Olene Floss, M.D.  Encounter Date: 08/20/2016      End of Session - 08/20/16 1636    Visit Number 2   Number of Visits 4   Date for SLP Re-Evaluation 09/14/16   Authorization Type medicaid   SLP Start Time 1530   SLP Stop Time  [id number]   SLP Time Calculation (min) 45 min   Activity Tolerance Patient tolerated treatment well      Past Medical History:  Diagnosis Date  . Brain injury ([place])    HX OF TRAUMATIC BRAIN INJURY  . Diabetes mellitus   . Disorder of vocal cord   . Dysphagia   . GERD (gastroesophageal reflux disease)   . Hypercholesteremia   . Hypertension   . Stroke ([place])   . Thyroid disease     Past Surgical History:  Procedure Laterality Date  . CESAREAN SECTION    . GASTROSTOMY TUBE PLACEMENT    . REMOVAL OF GASTROSTOMY TUBE      There were no vitals filed for this visit.      Subjective Assessment - 08/20/16 1630    Subjective 'We're doing okay."- spouse   Patient is accompained by: --  spouse and other family, interpreter Laurene Footman   Currently in Pain? No/denies               ADULT SLP TREATMENT - 08/20/16 1631      General Information   Behavior/Cognition Alert;Cooperative;Pleasant mood;Requires cueing   Patient Positioning Upright in chair     Treatment Provided   Treatment provided Cognitive-Linquistic     Pain Assessment   Pain Assessment No/denies pain     Cognitive-Linquistic Treatment   Treatment focused on Aphasia;Apraxia   Skilled Treatment Focus on use of yes/no to facilitate improved communication at home. Pt able to say "no" consistently and benefited from cue to "make a noise like a snake" to form the initial sound for "si".  Mod faded to min A for 90% acc with response while using spoken response, head movement and pointing to the correct response. Pt unable to generate spoken or written language independently.     Assessment / Recommendations / Plan   Plan Continue with current plan of care     Progression Toward Goals   Progression toward goals Progressing toward goals          SLP Education - 08/20/16 1635    Education provided Yes   Education Details apraxia of speech, HEP   Person(s) Educated Patient;Child(ren)   Methods Demonstration;Explanation;Handout   Comprehension Verbalized understanding            SLP Long Term Goals - 08/20/16 1637      SLP LONG TERM GOAL #1   Title pt will benefit from husband's appropriate cues during 2 ST sessions   Time 3   Period Weeks   Status On-going     SLP LONG TERM GOAL #2   Title pt will simultaneously produce one syllable words functional to her environment with 30% success   Time 3   Status On-going     SLP LONG TERM GOAL #3   Title pt will use a functional multi-modal communication system to improve expressive communication with husband/family   Time 3   Status  On-going          Plan - 08/20/16 1636    Clinical Impression Statement Pt presents with 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 Pt's family was instructed on how to initially work with pt at home. Pt would benefit from skilled ST to incr pt independence and decr caregiver burden.   Speech Therapy Frequency 2x / week   Potential to Achieve Goals Fair   Potential Considerations Severity of impairments   Consulted and Agree with Plan of Care Patient;Family member/caregiver   Family Member Consulted husband      Patient will benefit from skilled therapeutic intervention in order to improve the following deficits and impairments:   Apraxia of speech  Aphasia following  nontraumatic intracerebral hemorrhage    Problem List Patient Active Problem List   Diagnosis Date Noted  . 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) ([place]) 09/19/2015  . Right facial numbness 02/09/2015  . Anxiety state 10/28/2014  . Language barrier to communication 10/28/2014  . Cephalalgia   . Change in vision 03/02/2014  . Seizures ([place]) 02/24/2014  . Hemorrhagic stroke ([place]) 02/16/2014  . Hypertension 02/16/2014  . DM type 2 (diabetes mellitus, type 2) ([place]) 02/16/2014  . Aphasia as late effect of stroke 02/16/2014  . Paralysis ([place]) 02/16/2014  . Hyperlipidemia 02/16/2014    Vinetta Bergamo MA, CCC-SLP 08/20/2016, 4:38 PM  [facility] [address] [place], [place], [address] Phone: [phone]   Fax:  [phone]   Name: Amy [name] MRN: [id number] Date of Birth: 02-18-1956

## 2016-08-27 ENCOUNTER — Ambulatory Visit: Payer: Medicaid Other

## 2016-08-27 DIAGNOSIS — I69120 Aphasia following nontraumatic intracerebral hemorrhage: Secondary | ICD-10-CM

## 2016-08-27 DIAGNOSIS — R482 Apraxia: Secondary | ICD-10-CM

## 2016-08-27 NOTE — Therapy (Signed)
[facility] [address] [place], [place], [address] Phone: [phone]   Fax:  [phone]  Speech Language Pathology Treatment  Patient Details  Name: Amy [name] MRN: [id number] Date of Birth: 1956-04-04 Referring Provider: Olene Floss, M.D.  Encounter Date: 08/27/2016      End of Session - 08/27/16 1646    Visit Number 3   Number of Visits 4   Date for SLP Re-Evaluation 09/14/16   Authorization Type medicaid   SLP Start Time 1542   SLP Stop Time  1623   SLP Time Calculation (min) 41 min   Activity Tolerance Patient tolerated treatment well      Past Medical History:  Diagnosis Date  . Brain injury ([facility])    HX OF TRAUMATIC BRAIN INJURY  . Diabetes mellitus   . Disorder of vocal cord   . Dysphagia   . GERD (gastroesophageal reflux disease)   . Hypercholesteremia   . Hypertension   . Stroke ([facility])   . Thyroid disease     Past Surgical History:  Procedure Laterality Date  . CESAREAN SECTION    . GASTROSTOMY TUBE PLACEMENT    . REMOVAL OF GASTROSTOMY TUBE      There were no vitals filed for this visit.      Subjective Assessment - 08/27/16 1633    Subjective "Fine" (husband)   Patient is accompained by: --  spouse and other family, interpreter Laurene Footman   Currently in Pain? No/denies               ADULT SLP TREATMENT - 08/27/16 1615      General Information   Behavior/Cognition Alert;Cooperative;Pleasant mood;Requires cueing     Treatment Provided   Treatment provided Cognitive-Linquistic     Cognitive-Linquistic Treatment   Treatment focused on Aphasia;Apraxia   Skilled Treatment Pt/husband acknowledged that pt's last session was helpful in "si" or "no". SLP spent entire session explaining low-tech communication means to pt/husband/family and how to organize it for most benefit for pt/family to communicate. Pt's main communication now is pointing, and husband stated most  frustrating situation is when pt wants to tell them something. SLP suggested to narrow down inquiry to person, place, or item, and then further narrow down until it is communicated what pt desires. Husband and family encouraged to begin to take pictures or set pictures up a tablet for communication to be easier amongst them. SLP asked husband and pt a number of times what would  be more situations pt would benefit from having augmentative/alternative communication but SLP never got exact answer. Extent of pt's verbalization was "ah" repeatedly.     Assessment / Recommendations / Plan   Plan Continue with current plan of care     Progression Toward Goals   Progression toward goals Progressing toward goals          SLP Education - 08/27/16 1645    Education provided Yes   Education Details tablet specs for possible augmentative/alternative communication   Person(s) Educated Patient;Child(ren);Spouse   Methods Explanation;Handout   Comprehension Verbalized understanding            SLP Long Term Goals - 08/27/16 1648      SLP LONG TERM GOAL #1   Title pt will benefit from husband's appropriate cues during 2 ST sessions   Time 2   Period Weeks   Status On-going     SLP LONG TERM GOAL #2   Title pt will simultaneously produce one syllable  words functional to her environment with 30% success   Time 2   Status On-going     SLP LONG TERM GOAL #3   Title pt will use a functional multi-modal communication system to improve expressive communication with husband/family   Time 2   Status On-going          Plan - 08/27/16 1647    Clinical Impression Statement Pt presents with 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 Pt's family was instructed on how to initially work with pt at home. Pt would benefit from skilled ST to incr pt independence and decr caregiver burden.   Speech  Therapy Frequency 2x / week   Duration --  8 weeks recommended however pt does not wish to be selfpay status and so 3 visits at once a week will be scheduled.   Treatment/Interventions SLP instruction and feedback;Internal/external aids;Multimodal communcation approach;Language facilitation;Functional tasks;Patient/family education;Cueing hierarchy   Potential to Achieve Goals Fair   Potential Considerations Severity of impairments   Consulted and Agree with Plan of Care Patient;Family member/caregiver   Family Member Consulted husband      Patient will benefit from skilled therapeutic intervention in order to improve the following deficits and impairments:   Apraxia of speech  Aphasia following nontraumatic intracerebral hemorrhage    Problem List Patient Active Problem List   Diagnosis Date Noted  . 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 Right facial numbness 02/09/2015  . Anxiety state 10/28/2014  . Language barrier to communication 10/28/2014  . Cephalalgia   . Change in vision 03/02/2014  . Seizures ([facility]) 02/24/2014  . Hemorrhagic stroke ([facility]) 02/16/2014  . Hypertension 02/16/2014  . DM type 2 (diabetes mellitus, type 2) ([place]) 02/16/2014  . Aphasia as late effect of stroke 02/16/2014  . Paralysis ([place]) 02/16/2014  . Hyperlipidemia 02/16/2014    [facility] ,[place], CCC-SLP  08/27/2016, 4:50 PM  [name] [address] [place], [place], [address] Phone: [phone]   Fax:  [phone]   Name: Amy [name] MRN: [id number] Date of Birth: [birth year]

## 2016-08-27 NOTE — Patient Instructions (Addendum)
Tablet-  At least 16 GB memory Able to download apps from [place] to assist with communicating Able to purchase from [place], [employer], [employer], or other places like that. They are also available online too.

## 2016-09-10 ENCOUNTER — Ambulatory Visit: Payer: Medicaid Other

## 2017-06-24 ENCOUNTER — Ambulatory Visit (INDEPENDENT_AMBULATORY_CARE_PROVIDER_SITE_OTHER): Payer: Medicaid Other | Admitting: Internal Medicine

## 2017-06-24 ENCOUNTER — Encounter: Payer: Self-pay | Admitting: Internal Medicine

## 2017-06-24 ENCOUNTER — Other Ambulatory Visit (HOSPITAL_COMMUNITY)
Admission: RE | Admit: 2017-06-24 | Discharge: 2017-06-24 | Disposition: A | Discharge status: Home / Self Care | Payer: Medicaid Other | Source: Ambulatory Visit | Attending: Family Medicine | Admitting: Family Medicine

## 2017-06-24 VITALS — BP 116/74 | HR 82 | Temp 97.9°F | Wt 138.0 lb

## 2017-06-24 DIAGNOSIS — I10 Essential (primary) hypertension: Secondary | ICD-10-CM

## 2017-06-24 DIAGNOSIS — E119 Type 2 diabetes mellitus without complications: Secondary | ICD-10-CM | POA: Diagnosis present

## 2017-06-24 DIAGNOSIS — Z794 Long term (current) use of insulin: Secondary | ICD-10-CM | POA: Diagnosis present

## 2017-06-24 DIAGNOSIS — R569 Unspecified convulsions: Secondary | ICD-10-CM | POA: Diagnosis not present

## 2017-06-24 DIAGNOSIS — E785 Hyperlipidemia, unspecified: Secondary | ICD-10-CM

## 2017-06-24 DIAGNOSIS — I619 Nontraumatic intracerebral hemorrhage, unspecified: Secondary | ICD-10-CM | POA: Diagnosis not present

## 2017-06-24 DIAGNOSIS — Z124 Encounter for screening for malignant neoplasm of cervix: Secondary | ICD-10-CM

## 2017-06-24 DIAGNOSIS — Z1159 Encounter for screening for other viral diseases: Secondary | ICD-10-CM | POA: Insufficient documentation

## 2017-06-24 DIAGNOSIS — Z114 Encounter for screening for human immunodeficiency virus [HIV]: Secondary | ICD-10-CM | POA: Diagnosis not present

## 2017-06-24 LAB — POCT GLYCOSYLATED HEMOGLOBIN (HGB A1C): Hemoglobin A1C: 7.1

## 2017-06-24 MED ORDER — GLYBURIDE 5 MG PO TABS: 10.0000 mg | ORAL_TABLET | Freq: Two times a day (BID) | ORAL | 3 refills | Status: DC

## 2017-06-24 MED ORDER — ROSUVASTATIN CALCIUM 20 MG PO TABS: 20.0000 mg | ORAL_TABLET | Freq: Every day | ORAL | 3 refills | Status: DC

## 2017-06-24 MED ORDER — LEVETIRACETAM 500 MG PO TABS: 500.0000 mg | ORAL_TABLET | Freq: Two times a day (BID) | ORAL | 2 refills | Status: DC

## 2017-06-24 MED ORDER — LEVETIRACETAM 500 MG PO TABS: 500.0000 mg | ORAL_TABLET | Freq: Two times a day (BID) | ORAL | 0 refills | Status: DC

## 2017-06-24 MED ORDER — ASPIRIN EC 81 MG PO TBEC: 81.0000 mg | DELAYED_RELEASE_TABLET | Freq: Every day | ORAL | 11 refills | Status: DC

## 2017-06-24 MED ORDER — LISINOPRIL 10 MG PO TABS: 10.0000 mg | ORAL_TABLET | Freq: Every day | ORAL | 3 refills | Status: DC

## 2017-06-24 MED ORDER — METFORMIN HCL 500 MG PO TABS: 1000.0000 mg | ORAL_TABLET | Freq: Two times a day (BID) | ORAL | 3 refills | Status: DC

## 2017-06-24 NOTE — Patient Instructions (Addendum)
Amy [name], Su nivel de azcar en la sangre se ve mejor hoy. Tu A1c fue 7.1. Te llamar con tus resultados de laboratorio. Le esperan un examen de la vista, Lavinia Sharps y [place] colonoscopia. Contina el buen trabajo con tu ejercicio! Haga un seguimiento en aproximadamente 6 meses para ver cmo le est yendo con su diabetes. Mejor, Dr. Ola Spurr   Ms. Schinke,   Your blood sugar looks better today. Your A1c was 7.1.  I will call you with your lab results.  You are due for eye exam, mammogram, and colonoscopy.  Keep up the good work with your exercise!  Please follow-up in about 6 months to see how you are doing with your diabetes.  Best, Dr. Ola Spurr

## 2017-06-25 LAB — LIPID PANEL
CHOLESTEROL TOTAL: 104 mg/dL (ref 100–199)
Chol/HDL Ratio: 2.7 ratio (ref 0.0–4.4)
HDL: 39 mg/dL — ABNORMAL LOW (ref >39)
LDL Calculated: 38 mg/dL (ref 0–99)
TRIGLYCERIDES: 136 mg/dL (ref 0–149)
VLDL Cholesterol Cal: 27 mg/dL (ref 5–40)

## 2017-06-25 LAB — COMPREHENSIVE METABOLIC PANEL
A/G RATIO: 1.6 (ref 1.2–2.2)
ALBUMIN: 4.6 g/dL (ref 3.6–4.8)
ALT: 25 IU/L (ref 0–32)
AST: 21 IU/L (ref 0–40)
Alkaline Phosphatase: 74 IU/L (ref 39–117)
BUN/Creatinine Ratio: 15 (ref 12–28)
BUN: 16 mg/dL (ref 8–27)
Bilirubin Total: 0.4 mg/dL (ref 0.0–1.2)
CALCIUM: 9.5 mg/dL (ref 8.7–10.3)
CO2: 23 mmol/L (ref 20–29)
CREATININE: 1.08 mg/dL — AB (ref 0.57–1.00)
Chloride: 100 mmol/L (ref 96–106)
GFR, EST AFRICAN AMERICAN: 64 mL/min/{1.73_m2} (ref >59)
GFR, EST NON AFRICAN AMERICAN: 55 mL/min/{1.73_m2} — AB (ref >59)
GLOBULIN, TOTAL: 2.9 g/dL (ref 1.5–4.5)
Glucose: 116 mg/dL — ABNORMAL HIGH (ref 65–99)
POTASSIUM: 4.3 mmol/L (ref 3.5–5.2)
SODIUM: 139 mmol/L (ref 134–144)
TOTAL PROTEIN: 7.5 g/dL (ref 6.0–8.5)

## 2017-06-25 LAB — HIV ANTIBODY (ROUTINE TESTING W REFLEX): HIV Screen 4th Generation wRfx: NONREACTIVE

## 2017-06-25 LAB — HEPATITIS C ANTIBODY: Hep C Virus Ab: <0.1 s/co ratio (ref 0.0–0.9)

## 2017-06-26 LAB — CYTOLOGY - PAP
DIAGNOSIS: NEGATIVE
HPV: NOT DETECTED

## 2017-06-26 NOTE — Assessment & Plan Note (Signed)
-   Check lipid panel. HTN well controlled.

## 2017-06-26 NOTE — Assessment & Plan Note (Signed)
-   A1c improved at 7.1 down from 8.2 last fall. Continue metformin 1000 mg BID and glyburide 10 mg BID.  - Encouraged patient to continue walking  - Pt says she has eye appointment scheduled.

## 2017-06-26 NOTE — Assessment & Plan Note (Signed)
-   Well controlled at <140/90.  - Continue lisinopril 10 mg daily. Check CMP.

## 2017-06-26 NOTE — Progress Notes (Signed)
[name] Progress Note  Subjective:  Amy [name] is a 61 y.o. female with history of stroke in 2014 with subsequent aphasia and RUE weakness, TBI, HLD and T2DM who presents for follow-up of diabetes and "check-up." Visit assisted by Spanish video interpreter. Husband, daughter-in-law and 2 grandchildren present.   #T2DM: CHRONIC DIABETES  Disease Monitoring  Blood Sugar Ranges: 100s  Polyuria: no   Visual problems: no   Medication Compliance: yes; taking glyburide 10 mg BID and metformin 1000 mg BID Medication Side Effects  Hypoglycemia: no   Preventitive Health Care  Eye Exam: Due; patient's husband says she has an appointment  Foot Exam: 08/2016  Diet: Likes vegetables. Limits sweets.   Exercise: Regular - walks daily  #HTN: - Takes lisinopril 10 mg daily  #Hx of stroke: - Takes aspirin and crestor  Health Maintenance: Due for pap smear, colonoscopy, mammogram  Social: Nonsmoker  Allergies  Allergen Reactions  . Shrimp [Shellfish Allergy] Anaphylaxis  . Phenytoin Hives and Itching    Objective: Blood pressure 116/74, pulse 82, temperature 97.9 F (36.6 C), temperature source Oral, weight 138 lb (62.6 kg), SpO2 96 %. Body mass index is 24.45 kg/m. Constitutional: Pleasant female, unable to express herself beyond saying "ah" and gesturing. Husband assisting. HENT: MMM Cardiovascular: RRR, S1, S2, no m/r/g.  Pulmonary/Chest: Effort normal and breath sounds normal. No respiratory distress.  Abdominal: Soft. +BS, NT, ND GU: No lesions or vaginal discharge noted on speculum exam. No cervical motion tenderness on bimanual exam. Chaperone present.  Psychiatric: Somewhat flat affect.   Vitals reviewed  Assessment/Plan: DM type 2 (diabetes mellitus, type 2) - A1c improved at 7.1 down from 8.2 last fall. Continue metformin 1000 mg BID and glyburide 10 mg BID.  - Encouraged patient to continue walking  - Pt says she has eye appointment  scheduled.  Essential hypertension - Well controlled at <140/90.  - Continue lisinopril 10 mg daily. Check CMP.   Hemorrhagic stroke - Check lipid panel. HTN well controlled.   Health maintenance: Pap smear obtained. Provided handouts on colonoscopy and mammogram.   Lab Orders     HIV antibody     Hepatitis C Antibody     Lipid Panel     Comprehensive metabolic panel     POCT HgB A1C (CPT 83036)  Follow-up in about 6 months for T2DM check.  Olene Floss, MD [place], PGY-3

## 2017-06-27 ENCOUNTER — Encounter: Payer: Self-pay | Admitting: Internal Medicine

## 2017-07-18 ENCOUNTER — Ambulatory Visit (INDEPENDENT_AMBULATORY_CARE_PROVIDER_SITE_OTHER): Payer: Medicaid Other | Admitting: Neurology

## 2017-07-18 ENCOUNTER — Encounter: Payer: Self-pay | Admitting: Neurology

## 2017-07-18 VITALS — BP 122/70 | HR 90 | Ht 61.5 in | Wt 139.6 lb

## 2017-07-18 DIAGNOSIS — E785 Hyperlipidemia, unspecified: Secondary | ICD-10-CM | POA: Diagnosis not present

## 2017-07-18 DIAGNOSIS — I69398 Other sequelae of cerebral infarction: Secondary | ICD-10-CM

## 2017-07-18 DIAGNOSIS — R569 Unspecified convulsions: Secondary | ICD-10-CM

## 2017-07-18 DIAGNOSIS — I69320 Aphasia following cerebral infarction: Secondary | ICD-10-CM

## 2017-07-18 DIAGNOSIS — I69359 Hemiplegia and hemiparesis following cerebral infarction affecting unspecified side: Secondary | ICD-10-CM

## 2017-07-18 DIAGNOSIS — E119 Type 2 diabetes mellitus without complications: Secondary | ICD-10-CM | POA: Diagnosis not present

## 2017-07-18 DIAGNOSIS — Z794 Long term (current) use of insulin: Secondary | ICD-10-CM

## 2017-07-18 DIAGNOSIS — I10 Essential (primary) hypertension: Secondary | ICD-10-CM | POA: Diagnosis not present

## 2017-07-18 NOTE — Progress Notes (Signed)
NEUROLOGY FOLLOW UP OFFICE NOTE  Amy [name] [id number]  HISTORY OF PRESENT ILLNESS: Amy [name] is a 61 year old right-handed Spanish-speaking woman from [place] with hypertension, diabetes, hyperlipidemia, depression, and stroke with seizure who follows up for symptomatic seizure secondary to left MCA stroke.  She is accompanied by her daughter who supplements history.  An interpretor is present.     UPDATE: She takes Keppra 562m twice daily for seizure prevention.  For secondary stroke prevention, she takes ASA 896mdaily and Crestor 2063m LDL from 06/24/17 was 38.  She has not had any recurrent seizure.  Last year, she was evaluated by speech therapy.  Therapy was recommended but patient did not want to be self-pay status.  Her daughter would like her to try and get speech therapy if possible.  She sometimes has positional vertigo, described as spinning sensation lasting for just a few seconds.  It only occurs when she bends over to pick something up.  It only occurs occasionally.   HISTORY: She had a left malignant MCA stroke [place] October 2014.  She was in the hospital for elevated blood pressure following a fall where she it her head.  During her hospitalization, she developed expressive greater than receptive aphasia and right hemiparesis and numbness of arm and leg.  She required a craniotomy due to brain swelling.  During her hospitalization, she was intubated for several days and had a PEG placement.   While in the hospital, she had a seizure in October and later another seizure in December.  She was initially on Dilantin which was later switched to Keppra due to rash.  She has had no recurrent seizures.   At baseline, she is aphasic, primarily expressive.  She has mild dysphagia.  She has right sided residual weakness.  She also has residual right sided numbness.   She was hospitalized in January 2016 for headache and found to be hypertensive.  CT of head showed  left MCA distribution encephalomalacia, but nothing acute.  Her glucose level was 400 and had a slight anion gap of 16 without ketoacidosis.  In April 2016, she began experiencing new left sided numbness and tingling without new motor deficits.  MRI and MRA of head performed 02/09/15 showed no new infarct.  Carotid doppler from 09/26/15 showed no hemodynamically significant stenosis.    PAST MEDICAL HISTORY: Past Medical History:  Diagnosis Date  . Brain injury ([facility]  HX OF TRAUMATIC BRAIN INJURY  . Diabetes mellitus   . Disorder of vocal cord   . Dysphagia   . GERD (gastroesophageal reflux disease)   . Hypercholesteremia   . Hypertension   . [place]roke ([facility] . Thyroid disease     MEDICATIONS: Current Outpatient Prescriptions on File Prior to Visit  Medication Sig Dispense Refill  . aspirin EC 81 MG tablet Take 1 tablet (81 mg total) by mouth daily. 30 tablet 11  . Blood Glucose Monitoring Suppl (ACCU-CHEK AVIVA PLUS) w/Device KIT Please use to check blood sugars once a day. 1 kit 0  . glucose blood (ACCU-CHEK AVIVA) test strip 1 each by Other route daily. Use as instructed 100 each 10  . glyBURIDE (DIABETA) 5 MG tablet Take 2 tablets (10 mg total) by mouth 2 (two) times daily with a meal. 360 tablet 3  . levETIRAcetam (KEPPRA) 500 MG tablet Take 1 tablet (500 mg total) by mouth 2 (two) times daily. 180 tablet 2  . lisinopril (PRINIVIL,ZESTRIL) 10 MG  tablet Take 1 tablet (10 mg total) by mouth daily. 90 tablet 3  . metFORMIN (GLUCOPHAGE) 500 MG tablet Take 2 tablets (1,000 mg total) by mouth 2 (two) times daily with a meal. 360 tablet 3  . rosuvastatin (CRESTOR) 20 MG tablet Take 1 tablet (20 mg total) by mouth daily. 90 tablet 3   No current facility-administered medications on file prior to visit.     ALLERGIES: Allergies  Allergen Reactions  . Shrimp [Shellfish Allergy] Anaphylaxis  . Phenytoin Hives and Itching    FAMILY HISTORY: Family History  Problem Relation Age of  Onset  . Heart Problems Father     SOCIAL HISTORY: Social History   Social History  . Marital status: Married    Spouse name: N/A  . Number of children: 5  . Years of education: N/A   Occupational History  . Not on file.   Social History Main Topics  . Smoking status: Never Smoker  . Smokeless tobacco: Never Used  . Alcohol use No  . Drug use: No  . Sexual activity: Not on file   Other Topics Concern  . Not on file   Social History Narrative   Patient lives at home with her husband and daughter in law.          REVIEW OF SYSTEMS: Constitutional: No fevers, chills, or sweats, no generalized fatigue, change in appetite Eyes: No visual changes, double vision, eye pain Ear, nose and throat: No hearing loss, ear pain, nasal congestion, sore throat Cardiovascular: No chest pain, palpitations Respiratory:  No shortness of breath at rest or with exertion, wheezes GastrointestinaI: No nausea, vomiting, diarrhea, abdominal pain, fecal incontinence Genitourinary:  No dysuria, urinary retention or frequency Musculoskeletal:  No neck pain, back pain Integumentary: No rash, pruritus, skin lesions Neurological: as above Psychiatric: No depression, insomnia, anxiety Endocrine: No palpitations, fatigue, diaphoresis, mood swings, change in appetite, change in weight, increased thirst Hematologic/Lymphatic:  No purpura, petechiae. Allergic/Immunologic: no itchy/runny eyes, nasal congestion, recent allergic reactions, rashes  PHYSICAL EXAM: Vitals:  BP 122/70; HR 90 General: No acute distress.  Patient appears well-groomed.  * Head:  Normocephalic/atraumatic Eyes:  Fundi examined but not visualized Neck: supple, no paraspinal tenderness, full range of motion Heart:  Regular rate and rhythm Lungs:  Clear to auscultation bilaterally Back: No paraspinal tenderness Neurological Exam: alert and oriented to person, place, and time. Attention span and concentration intact, recent and  remote memory intact, fund of knowledge intact.  Expressive aphasia.  right lower facial weakness.  Decreased right V1-V2 distribution.  Otherwrise, CN II-XII intact. Increased right sided tone. 5- right deltoid, 4+ right triceps, right grip plegic, 5-/5 right proximal leg.  Otherwise, 5/5. Mar[place] Kitchen  Sensation to light touch decreased on right.  Deep tendon reflexes 3+ right upper and lower extremities.  2+ on left.  Finger to nose and heel to shin testing intact.  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  Keppra 562m twice daily 2.  ASA 819mdaily for secondary stroke prevention. 3.  Continue blood pressure and glycemic control as managed by PCP 4.  Continue statin therapy as managed by PCP (LDL goal should be less than 70) 5. Take time when bending over.  If dizziness persists, she can try Epley maneuver or meclizine.  6. Follow up in one year  27 minutes  spent face to face with patient, over 50% spent discussing diagnosis and management.  Metta Clines, DO  CC:  Lovenia Kim, MD

## 2017-07-18 NOTE — Patient Instructions (Addendum)
1.  Continue aspirin 81mg  daily and Crestor 20mg  daily 2.  If blood pressure still very high, I recommend contacting your primary doctor immediately or go to the emergency room 3.  We will see if speech therapy is available 4.  Continue levetiracetam 500mg  twice daily 5.  Follow up in one year.    1. Continuar aspirina 81 mg al da y Crestor 20 mg al da 2. Si la presin arterial sigue siendo muy alta, recomiendo contactar a su mdico de cabecera de inmediato o ir a la sala de emergencias 3. Veremos si la terapia del habla est disponible. 4. Continuar con levetiracetam 500 mg [place]. 5. Seguimiento en un ao.

## 2017-07-26 ENCOUNTER — Telehealth: Payer: Self-pay | Admitting: Neurology

## 2017-07-26 NOTE — Telephone Encounter (Signed)
Patient still has not heard from anyone regarding starting Speech therapy? Please Advise. Thanks

## 2017-07-26 NOTE — Telephone Encounter (Signed)
LMOM letting them know her PCP did order speech therapy to go to [facility]. It looks like they have tried to call them to set this up. Left number on machine [phone] for them to call them directly to schedule.

## 2017-08-09 ENCOUNTER — Telehealth: Payer: Self-pay

## 2017-08-09 NOTE — Telephone Encounter (Signed)
Amy [name] with Language line at [phone] yesterday 08/08/17. We needed to know the name of the interpreter who came on Pts 07/18/17 OV for immigration/disability forms left here to be filled out, the interpreter needsd to sign them. Juliann Pulse initially said they did not have anyone assigned on that day, but called me back with a name of Amy [name] ph# [phone]. I called Mariel and she needed to call me back, she was with a client, she was able to come in the office later that day and sign the papers. The Pt, her [place] speaking granddaughter and her husband Ismael cam into the office this morning to pick up the papers and to also allow [place] to assess if she was able to read or write, she was not able to do so. The interpreter had prepared 3 simple sentences in spanish the patient was to read and follow directions 1) [place]-close your eyes 2) [place] mano derecha-raise your right hand 3) [place] nariz-touch your nose. Pt was unable to read or write

## 2017-08-14 ENCOUNTER — Telehealth: Payer: Self-pay | Admitting: Family Medicine

## 2017-08-14 NOTE — Telephone Encounter (Signed)
Form placed in PCP box 

## 2017-08-14 NOTE — Telephone Encounter (Signed)
Pt's grandchild came to office and leave a immigration form for the doctor to fill and sign. [phone]  (Red Folder)

## 2017-08-29 NOTE — Telephone Encounter (Signed)
Spoke with granddaughter today -- they are going to come by the clinic to bring patient's ID.  Please make a photocopy of it so that I can add the ID number to her paperwork and then submit it.  Thanks

## 2017-09-24 NOTE — Therapy (Signed)
[facility] [address] [place], [place], [address] Phone: [phone]   Fax:  [phone]  Patient Details  Name: Amy [name] MRN: [id number] Date of Birth: 1956/01/18 Referring Provider:  Olene Floss, MD  Encounter Date: 09/24/2017  SPEECH THERAPY DISCHARGE SUMMARY  Visits from Start of Care: 3  Current functional level related to goals / functional outcomes: Pt was seen for 2/3 scheduled ST sessions. Minimal progress made due to severity of deficits and time post onset. Goals at time of last session were:  SLP Long Term Goals - 08/27/16 1648              SLP LONG TERM GOAL #1    Title pt will benefit from husband's appropriate cues during 2 ST sessions    Time 2    Period Weeks    Status On-going         SLP LONG TERM GOAL #2    Title pt will simultaneously produce one syllable words functional to her environment with 30% success    Time 2    Status On-going         SLP LONG TERM GOAL #3    Title pt will use a functional multi-modal communication system to improve expressive communication with husband/family    Time 2    Status On-going                       Plan - 08/27/16 1647     Clinical Impression Statement Pt presents with 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 Pt's family was instructed on how to initially work with pt at home. Pt would benefit from skilled ST to incr pt independence and decr caregiver burden.   Pt did not arrive to her last scheduled session.   Remaining deficits: Unknown as pt last seen over 12 months ago.   Education / Equipment: Pt's family was provided tips on how to better communicate with pt at home, and home tasks to complete with pt.   Plan: Patient agrees to discharge.  Patient goals were not met. Patient is being discharged due to not returning  since the last visit.  ?????      [place] ,[place], CCC-SLP  09/24/2017, 12:35 PM  [facility] [address] [place], [place], [address] Phone: [phone]   Fax:  [phone]

## 2017-11-27 ENCOUNTER — Ambulatory Visit: Payer: Medicaid Other | Admitting: Family Medicine

## 2018-02-07 ENCOUNTER — Encounter: Payer: Self-pay | Admitting: Neurology

## 2018-06-10 ENCOUNTER — Ambulatory Visit (INDEPENDENT_AMBULATORY_CARE_PROVIDER_SITE_OTHER): Payer: Medicaid Other | Admitting: Family Medicine

## 2018-06-10 ENCOUNTER — Encounter: Payer: Self-pay | Admitting: Family Medicine

## 2018-06-10 ENCOUNTER — Other Ambulatory Visit: Payer: Self-pay

## 2018-06-10 VITALS — BP 124/64 | HR 84 | Temp 97.9°F | Ht 62.0 in | Wt 141.6 lb

## 2018-06-10 DIAGNOSIS — Z23 Encounter for immunization: Secondary | ICD-10-CM | POA: Diagnosis not present

## 2018-06-10 DIAGNOSIS — E119 Type 2 diabetes mellitus without complications: Secondary | ICD-10-CM

## 2018-06-10 DIAGNOSIS — Z794 Long term (current) use of insulin: Secondary | ICD-10-CM | POA: Diagnosis not present

## 2018-06-10 LAB — POCT GLYCOSYLATED HEMOGLOBIN (HGB A1C): HBA1C, POC (CONTROLLED DIABETIC RANGE): 9.7 % — AB (ref 0.0–7.0)

## 2018-06-10 LAB — GLUCOSE, POCT (MANUAL RESULT ENTRY): POC Glucose: 190 mg/dl — AB (ref 70–99)

## 2018-06-10 NOTE — Patient Instructions (Addendum)
It was nice seeing you again today.  You were seen in clinic for diabetes follow-up.  Your A1c is worse today, 9.7 from 7.1 in September 2018.  I would like you to continue taking your current medications and make sure you are taking these as prescribed.  Additionally, please check your blood sugars once a day and keep a log of these measurements to bring with you to your next visit.  As we discussed, another way to control this through diet and exercise is portion control.  You can try eating frequent smaller meals instead of 3 full meals a day.  Please call clinic if you have any questions.   I will follow-up with you in 3 months for your next A1c check.  If your A1c is any higher, we will consider making a change to your meds.    Lovenia Kim MD

## 2018-06-10 NOTE — Progress Notes (Signed)
   Subjective:   Patient ID: Amy [name]    DOB: 22-Nov-1955, 62 y.o. female   MRN: [id number]  CC: follow up diabetes   HPI: Amy [name] is a 62 y.o. female who presents to clinic today for f/u HTN with her husband.  She is Spanish speaking, [employer] interpreter used, [place] ID# [id number].   T2DM Last A1c 7.1 in 06/2017, worsened to 9.7% today.  Medications - metformin 1000 mg BID, glyburide 10 mg BID She checks her CBGs at home in the morning once a day about every 3 days, values usually range 215, 210 She endorses good medication compliance, does not forget any doses  Diet - eats rice, vegetables, chicken, very little other meats.  Eats bread and pasta.  Does not eat a lot of sweets.  Eats apples, pineapples, watermelon, oranges, bananas.  Drinks mostly water with meals, very little soda or juice.  Exercise - walks daily about 20 min, therapy exercises for stretching at home   Health Maintenance  -flu shot given today  ROS: No fever, chills, nausea, vomiting.  No abdominal pain.    Social: never smoker.  Medications reviewed.  Objective:   BP 124/64   Pulse 84   Temp 97.9 F (36.6 C) (Oral)   Ht 5\' 2"  (1.575 m)   Wt 141 lb 9.6 oz (64.2 kg)   SpO2 93%   BMI 25.90 kg/m  Vitals and nursing note reviewed.  General: 62 yo female, NAD  CV: RRR no MRG, 2+ pedal pulses Lungs: CTAB, normal effort  Abdomen: soft, NTND, no masses or organomegaly, +bs Skin: warm, dry, no rash Extremities: warm and well perfused, normal tone Neuro: alert, oriented x3, no focal deficits   Assessment & Plan:   DM type 2 (diabetes mellitus, type 2) Worsening to 9.7% today, last A1c 7.1 in 06/2017.  On metformin 1000 mg BID, glyburide 10 mg BID with good compliance.  Provided reassurance and encouragement to maintain her current lifestyle changes.  Will not make any medication changes today.  Have advised her to check CBGs more frequently, recommend keeping a log and bringing this to her next  visit for me to review.   -portion control, plate method discussed , continue exercise  -recheck a1c in 3 months  -if no improvement can consider medication adjustment    Orders Placed This Encounter  Procedures  . Flu Vaccine QUAD 36+ mos IM  . HgB A1c  . Glucose (CBG)   Lovenia Kim, MD [place], PGY-3 06/18/2018 11:54 AM

## 2018-06-18 NOTE — Assessment & Plan Note (Signed)
Worsening to 9.7% today, last A1c 7.1 in 06/2017.  On metformin 1000 mg BID, glyburide 10 mg BID with good compliance.  Provided reassurance and encouragement to maintain her current lifestyle changes.  Will not make any medication changes today.  Have advised her to check CBGs more frequently, recommend keeping a log and bringing this to her next visit for me to review.   -portion control, plate method discussed , continue exercise  -recheck a1c in 3 months  -if no improvement can consider medication adjustment

## 2018-06-19 ENCOUNTER — Other Ambulatory Visit: Payer: Self-pay | Admitting: Internal Medicine

## 2018-06-19 DIAGNOSIS — E119 Type 2 diabetes mellitus without complications: Secondary | ICD-10-CM

## 2018-06-19 DIAGNOSIS — I10 Essential (primary) hypertension: Secondary | ICD-10-CM

## 2018-06-19 DIAGNOSIS — E785 Hyperlipidemia, unspecified: Secondary | ICD-10-CM

## 2018-07-17 NOTE — Progress Notes (Signed)
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  She is accompanied by her daughter and husband who supplements history.  An interpreter is present.  UPDATE: She takes Keppra '500mg'$  twice daily for seizure prevention.  For secondary stroke prevention, she takes ASA '81mg'$  daily and Crestor '20mg'$ .  Hgb A1c from 06/10/18 was 9.7.  Overall she is doing well.  No seizures.  Occasionally she has a light headache across the forehead and temples.  No other symptoms.  It does not require treatment.    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 She was in the hospital for elevated blood pressure following a fall where she it her head. During her hospitalization, she developed expressive greater than receptive aphasia and right hemiparesis and numbness of arm and leg. She required a craniotomy due to brain swelling. During her hospitalization, she was intubated for several days and had a PEG placement. While in the hospital, she had a seizure in October and later another seizure in December. She was initially on Dilantin which was later switched to Keppra due to rash. She has had no recurrent seizures.  At baseline, she is aphasic, primarily expressive. She has mild dysphagia. She has right sided residual weakness. She also has residual right sided numbness.  She was hospitalized in January 2016 for headache and found to be hypertensive. CT of head showed left MCA distribution encephalomalacia, but nothing acute. Her glucose level was 400 and had a slight anion gap of 16 without ketoacidosis.  In April 2016, she began experiencing new left sided numbness and tingling without new motor deficits.  MRI and MRA of head performed 02/09/15 showed no new infarct.  Carotid doppler from 09/26/15 showed no hemodynamically significant stenosis.    PAST MEDICAL HISTORY: Past Medical History:  Diagnosis Date  . Brain injury ([place])    HX OF TRAUMATIC BRAIN INJURY  . Diabetes mellitus   . Disorder of vocal cord   . Dysphagia   . GERD (gastroesophageal reflux disease)   . Hypercholesteremia   . Hypertension   . Stroke ([place])   . Thyroid disease     MEDICATIONS: Current Outpatient Medications on File Prior to Visit  Medication Sig Dispense Refill  . aspirin EC 81 MG tablet Take 1 tablet (81 mg total) by mouth daily. 30 tablet 11  . Blood Glucose Monitoring Suppl (ACCU-CHEK AVIVA PLUS) w/Device KIT Please use to check blood sugars once a day. 1 kit 0  . glucose blood (ACCU-CHEK AVIVA) test strip 1 each by Other route daily. Use as instructed 100 each 10  . glyBURIDE (DIABETA) 5 MG tablet TAKE 2 TABLETS BY MOUTH TWICE DAILY WITH A MEAL 360 tablet 3  . levETIRAcetam (KEPPRA) 500 MG tablet Take 1 tablet (500 mg total) by mouth 2 (two) times daily. 180 tablet 2  . lisinopril (PRINIVIL,ZESTRIL) 10 MG tablet TAKE 1 TABLET BY MOUTH ONCE DAILY 90 tablet 3  . metFORMIN (GLUCOPHAGE) 500 MG tablet TAKE 2 TABLETS BY MOUTH TWICE DAILY WITH A MEAL 360 tablet 3  . rosuvastatin (CRESTOR) 20 MG tablet TAKE 1 TABLET BY MOUTH ONCE DAILY 90 tablet 3   No current facility-administered medications on file prior to visit.     ALLERGIES: Allergies  Allergen Reactions  . Shrimp [Shellfish Allergy] Anaphylaxis  . Phenytoin Hives and Itching    FAMILY HISTORY: Family History  Problem Relation Age of Onset  . Heart Problems Father    SOCIAL HISTORY: Social History   Socioeconomic History  . Marital status: Married    Spouse name: Not on file  . Number of children: 5  . Years of education: Not on file  . Highest education level: Not on file  Occupational History  . Not on file  Social Needs  .  Financial resource strain: Not on file  . Food insecurity:    Worry: Not on file    Inability: Not on file  . Transportation needs:    Medical: Not on file    Non-medical: Not on file  Tobacco Use  . Smoking status: Never Smoker  . Smokeless tobacco: Never Used  Substance and Sexual Activity  . Alcohol use: No  . Drug use: No  . Sexual activity: Not on file  Lifestyle  . Physical activity:    Days per week: Not on file    Minutes per session: Not on file  . Stress: Not on file  Relationships  . Social connections:    Talks on phone: Not on file    Gets together: Not on file    Attends religious service: Not on file    Active member of club or organization: Not on file    Attends meetings of clubs or organizations: Not on file    Relationship status: Not on file  . Intimate partner violence:    Fear of current or ex partner: Not on file    Emotionally abused: Not on file    Physically abused: Not on file    Forced sexual activity: Not on file  Other Topics Concern  . Not on file  Social History Narrative   Patient lives at home with her husband and daughter in law.          REVIEW OF SYSTEMS: Constitutional: No fevers, chills, or sweats, no generalized fatigue, change in appetite Eyes: No visual changes, double vision, eye pain Ear, nose and throat: No hearing loss, ear pain, nasal congestion, sore throat Cardiovascular: No chest pain, palpitations Respiratory:  No shortness of breath at rest or with exertion, wheezes GastrointestinaI: No nausea, vomiting, diarrhea, abdominal pain, fecal incontinence Genitourinary:  No dysuria, urinary retention or frequency Musculoskeletal:  No neck pain, back pain Integumentary: No rash, pruritus, skin lesions Neurological: as above Psychiatric: No depression, insomnia, anxiety Endocrine: No palpitations, fatigue, diaphoresis, mood swings, change in appetite, change in weight, increased thirst Hematologic/Lymphatic:  No purpura,  petechiae. Allergic/Immunologic: no itchy/runny eyes, nasal congestion, recent allergic reactions, rashes  PHYSICAL EXAM: Blood pressure 104/68, pulse 84, height '5\' 2"'$  (1.575 m), weight 139 lb (63 kg), SpO2 96 %. General: No acute distress.  Patient appears well-groomed.  Head:  Normocephalic/atraumatic Eyes:  Fundi examined but not visualized Neck: supple, no paraspinal tenderness, full range of motion Heart:  Regular rate and rhythm Lungs:  Clear to auscultation bilaterally Back: No paraspinal tenderness Neurological Exam: alert and oriented to person, place, and time. Attention span and concentration intact, recent and remote memory intact, fund of knowledge intact.  Expressive aphasia, slight receptive aphasia.  Decreased right V1-V2, right lower facial weakness.  Otherwise, CN II-XII intact. Increased right sided tone.  Muscle strength 5-/5 right deltoid, 4+/5 right triceps, right grip plegic, 5-/5 right proximal leg.  Otherwise, 5/5  Sensation to light touch  decreased on right.  Deep tendon reflexes 3+ right upper and lower extremities, 2+ on left.  Finger to nose testing intact.  Slight right hemiplegic gait.  IMPRESSION: 1.  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  HTN 4.  Type 2 diabetes mellitus 5.  Hyperlipidemia 6.  Episodic tension-type headache, not intractable, manageable and infrequent  PLAN: 1.  Keppra '500mg'$  twice daily  Secondary stroke prevention as managed by PCP: 2.  ASA '81mg'$  daily for secondary stroke prevention 3.  Optimize glycemic control 4.  Continue blood pressure control 5.  Continue statin therapy (LDL goal less than 70)  20 minutes spent face to face with patient, over 50% spent discussing management.  Metta Clines, DO  CC: Lovenia Kim, MD

## 2018-07-18 ENCOUNTER — Encounter: Payer: Self-pay | Admitting: Neurology

## 2018-07-18 ENCOUNTER — Ambulatory Visit (INDEPENDENT_AMBULATORY_CARE_PROVIDER_SITE_OTHER): Payer: Medicaid Other | Admitting: Neurology

## 2018-07-18 VITALS — BP 104/68 | HR 84 | Ht 62.0 in | Wt 139.0 lb

## 2018-07-18 DIAGNOSIS — I10 Essential (primary) hypertension: Secondary | ICD-10-CM

## 2018-07-18 DIAGNOSIS — I69359 Hemiplegia and hemiparesis following cerebral infarction affecting unspecified side: Secondary | ICD-10-CM | POA: Diagnosis not present

## 2018-07-18 DIAGNOSIS — E785 Hyperlipidemia, unspecified: Secondary | ICD-10-CM

## 2018-07-18 DIAGNOSIS — G44219 Episodic tension-type headache, not intractable: Secondary | ICD-10-CM

## 2018-07-18 DIAGNOSIS — E119 Type 2 diabetes mellitus without complications: Secondary | ICD-10-CM

## 2018-07-18 DIAGNOSIS — R569 Unspecified convulsions: Secondary | ICD-10-CM

## 2018-07-18 DIAGNOSIS — I69398 Other sequelae of cerebral infarction: Secondary | ICD-10-CM

## 2018-07-18 DIAGNOSIS — I69320 Aphasia following cerebral infarction: Secondary | ICD-10-CM | POA: Diagnosis not present

## 2018-07-18 DIAGNOSIS — Z794 Long term (current) use of insulin: Secondary | ICD-10-CM

## 2018-07-18 NOTE — Patient Instructions (Addendum)
1.  Continue levetiracetam 500mg  twice daily for seizure prevention 2.  Continue aspirin 81mg  daily and Crestor 20mg  daily as prescribed by your primary care doctor 3.  Continue diabetes medication and blood pressure medication 4.  Follow up in one year or as needed.  1. Contine con levetiracetam 500mg  dos veces al da para la prevencin de ataques 2. Contine con aspirina 81 mg al da y Crestor 20 mg al da segn lo prescrito por su mdico de atencin primaria 3. Continuar con medicamentos para la diabetes y medicamentos para la presin arterial. 4. Seguimiento en un ao o segn sea necesario.

## 2018-09-25 ENCOUNTER — Other Ambulatory Visit: Payer: Self-pay

## 2018-09-25 DIAGNOSIS — R569 Unspecified convulsions: Secondary | ICD-10-CM

## 2018-09-25 MED ORDER — LEVETIRACETAM 500 MG PO TABS: 500.0000 mg | ORAL_TABLET | Freq: Two times a day (BID) | ORAL | 2 refills | Status: DC

## 2018-09-30 ENCOUNTER — Other Ambulatory Visit: Payer: Self-pay

## 2018-09-30 ENCOUNTER — Ambulatory Visit: Payer: Medicaid Other | Admitting: Family Medicine

## 2018-09-30 ENCOUNTER — Encounter: Payer: Self-pay | Admitting: Family Medicine

## 2018-09-30 VITALS — BP 116/65 | HR 96 | Temp 98.4°F | Wt 139.0 lb

## 2018-09-30 DIAGNOSIS — E119 Type 2 diabetes mellitus without complications: Secondary | ICD-10-CM

## 2018-09-30 DIAGNOSIS — E785 Hyperlipidemia, unspecified: Secondary | ICD-10-CM

## 2018-09-30 DIAGNOSIS — Z794 Long term (current) use of insulin: Secondary | ICD-10-CM

## 2018-09-30 LAB — POCT GLYCOSYLATED HEMOGLOBIN (HGB A1C): HbA1c, POC (controlled diabetic range): 7.6 % — AB (ref 0.0–7.0)

## 2018-09-30 MED ORDER — ROSUVASTATIN CALCIUM 20 MG PO TABS: 20.0000 mg | ORAL_TABLET | Freq: Every day | ORAL | 3 refills | Status: DC

## 2018-09-30 MED ORDER — GLUCOSE BLOOD VI STRP: 1.0000 | ORAL_STRIP | Freq: Every day | 10 refills | Status: AC

## 2018-09-30 NOTE — Patient Instructions (Addendum)
It was nice seeing you again today.  You were seen in clinic for follow-up of your diabetes and your A1c today was 7.6 from 9.7 last time.  I will not be making any changes to your medications at this time.  I have refilled your test strips and your rosuvastatin.  I would encourage you to continue your diet and exercise changes as these have been helping you control your blood sugars better.  You may follow-up in 3 months for your next A1c check.  Please call clinic if you have any questions.  Lovenia Kim MD

## 2018-09-30 NOTE — Progress Notes (Signed)
   Subjective:   Patient ID: Amy [name]    DOB: 05-22-1956, 62 y.o. female   MRN: [id number]  CC: f/u diabetes   HPI: Amy [name] is a 62 y.o. female who presents to clinic today for diabetes follow up.  Patient is Spanish speaking, [employer] interpreter used for this encounter, Royetta Car ID# [id number].    T2DM Feels sugars are well controlled.  Has brought in her CBG logs today, morning fasting 115-120.  2PM after lunch 120-130 and 8PM after dinner 120-130.  Checking 3x a day.   -Medications: metformin 500 mg BID, glyburide 5 mg daily  Reports good medication compliance.  -Diet: vegetables, fish, chicken, fruits, baked and broiled foods not fried, drinks a lot of water, drinks tea  -Exercise: walks daily, 30 min a day Recent eye exam 8 days ago, normal results.  Recent foot exam October 4.  ROS: No fever, chills, nausea, vomiting, abdominal pain.   Social: pt is a never smoker.  Medications reviewed. Objective:   BP 116/65   Pulse 96   Temp 98.4 F (36.9 C) (Oral)   Wt 139 lb (63 kg)   SpO2 94%   BMI 25.42 kg/m  Vitals and nursing note reviewed.  General: 62 year old female, NAD Neck: supple CV: RRR no MRG  Lungs: CTAB, normal effort  Abdomen: soft, NTND, +bs  Skin: warm, dry Extremities: warm and well perfused  Assessment & Plan:   DM type 2 (diabetes mellitus, type 2) A1c improved today 7.6 from 9.7.  Encouraged her to continue her diet and lifestyle modifications.  Continue current medications, no changes made today. Recent eye exam 8 days ago, normal results.  Recent foot exam October 4. Advised follow-up in 3 months   Orders Placed This Encounter  Procedures  . HgB A1c   Meds ordered this encounter  Medications  . rosuvastatin (CRESTOR) 20 MG tablet    Sig: Take 1 tablet (20 mg total) by mouth daily.    Dispense:  90 tablet    Refill:  3  . glucose blood (ACCU-CHEK AVIVA) test strip    Sig: 1 each by Other route daily. Use as instructed   Dispense:  100 each    Refill:  [place], MD [place]

## 2018-09-30 NOTE — Assessment & Plan Note (Signed)
A1c improved today 7.6 from 9.7.  Encouraged her to continue her diet and lifestyle modifications.  Continue current medications, no changes made today. Recent eye exam 8 days ago, normal results.  Recent foot exam October 4. Advised follow-up in 3 months

## 2018-12-31 ENCOUNTER — Ambulatory Visit: Payer: Medicaid Other | Admitting: Family Medicine

## 2019-07-17 NOTE — Progress Notes (Deleted)
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  She is accompanied by her daughter and husband who supplements history.  An interpreter is present.  UPDATE: She takes Keppra 577m twice daily for seizure prevention.  For secondary stroke prevention, she takes ASA 837mdaily and Crestor 2020m   ***  HISTORY: She [place] malignant [place].She was in the hospital for elevated blood pressure following a fall where she it her head.During her hospitalization, she developed expressive greater than receptive aphasia and right hemiparesis and numbness of arm and leg.She required a craniotomy due to brain swelling.During her hospitalization, she was intubated for several days and had a PEG placement.While in the hospital, she had a seizure in October and later another seizure in December.She was initially on Dilantin which was later switched to Keppra due to rash.She has had no recurrent seizures.  At baseline, she is aphasic, primarily expressive.She has mild dysphagia.She has right sided residual weakness.She also has residual right sided numbness.  She was hospitalized in January 2016 for headache and found to be hypertensive.CT of head showed left MCA distribution encephalomalacia, but nothing acute.Her glucose level was 400 and had a slight anion gap of 16 without ketoacidosis. In April 2016, she began experiencing new left sided numbness and tingling without new motor deficits.MRI and MRA of head performed 02/09/15 showed no new infarct. Carotid doppler from 09/26/15 showed no hemodynamically significant stenosis.   PAST MEDICAL HISTORY: Past Medical History:   Diagnosis Date  . [place]  HX OF TRAUMATIC BRAIN INJURY  . Diabetes mellitus   . Disorder of vocal cord   . Dysphagia   . GERD (gastroesophageal reflux disease)   . Hypercholesteremia   . Hypertension   . [place] . Thyroid disease     MEDICATIONS: Current Outpatient Medications on File Prior to Visit  Medication Sig Dispense Refill  . aspirin EC 81 MG tablet Take 1 tablet (81 mg total) by mouth daily. 30 tablet 11  . Blood Glucose Monitoring Suppl (ACCU-CHEK AVIVA PLUS) w/Device KIT Please use to check blood sugars once a day. 1 kit 0  . glucose blood (ACCU-CHEK AVIVA) test strip 1 each by Other route daily. Use as instructed 100 each 10  . glyBURIDE (DIABETA) 5 MG tablet TAKE 2 TABLETS BY MOUTH TWICE DAILY WITH A MEAL 360 tablet 3  . levETIRAcetam (KEPPRA) 500 MG tablet Take 1 tablet (500 mg total) by mouth 2 (two) times daily. 180 tablet 2  . lisinopril (PRINIVIL,ZESTRIL) 10 MG tablet TAKE 1 TABLET BY MOUTH ONCE DAILY 90 tablet 3  . metFORMIN (GLUCOPHAGE) 500 MG tablet TAKE 2 TABLETS BY MOUTH TWICE DAILY WITH A MEAL 360 tablet 3  . rosuvastatin (CRESTOR) 20 MG tablet Take 1 tablet (20 mg total) by mouth daily. 90 tablet 3   No current facility-administered medications on file prior to visit.     ALLERGIES: Allergies  Allergen Reactions  . Shrimp [Shellfish Allergy] Anaphylaxis  . Phenytoin Hives and Itching    FAMILY HISTORY: Family History  Problem Relation Age of Onset  . Heart Problems Father    ***.  SOCIAL HISTORY: Social History   Socioeconomic History  . Marital status:  Married    Spouse name: Not on file  . Number of children: 5  . Years of education: Not on file  . Highest education level: Not on file  Occupational History  . Not on file  Social Needs  . Financial resource strain: Not on file  . Food insecurity    Worry: Not on file    Inability: Not on file  . Transportation needs    Medical: Not on file    Non-medical: Not  on file  Tobacco Use  . Smoking status: Never Smoker  . Smokeless tobacco: Never Used  Substance and Sexual Activity  . Alcohol use: No  . Drug use: No  . Sexual activity: Not on file  Lifestyle  . Physical activity    Days per week: Not on file    Minutes per session: Not on file  . Stress: Not on file  Relationships  . Social Herbalist on phone: Not on file    Gets together: Not on file    Attends religious service: Not on file    Active member of club or organization: Not on file    Attends meetings of clubs or organizations: Not on file    Relationship status: Not on file  . Intimate partner violence    Fear of current or ex partner: Not on file    Emotionally abused: Not on file    Physically abused: Not on file    Forced sexual activity: Not on file  Other Topics Concern  . Not on file  Social History Narrative   Patient lives at home with her husband and daughter in law.          REVIEW OF SYSTEMS: Constitutional: No fevers, chills, or sweats, no generalized fatigue, change in appetite Eyes: No visual changes, double vision, eye pain Ear, nose and throat: No hearing loss, ear pain, nasal congestion, sore throat Cardiovascular: No chest pain, palpitations Respiratory:  No shortness of breath at rest or with exertion, wheezes GastrointestinaI: No nausea, vomiting, diarrhea, abdominal pain, fecal incontinence Genitourinary:  No dysuria, urinary retention or frequency Musculoskeletal:  No neck pain, back pain Integumentary: No rash, pruritus, skin lesions Neurological: as above Psychiatric: No depression, insomnia, anxiety Endocrine: No palpitations, fatigue, diaphoresis, mood swings, change in appetite, change in weight, increased thirst Hematologic/Lymphatic:  No purpura, petechiae. Allergic/Immunologic: no itchy/runny eyes, nasal congestion, recent allergic reactions, rashes  PHYSICAL EXAM: *** General: No acute distress.  Patient appears  ***-groomed.   Head:  Normocephalic/atraumatic Eyes:  Fundi examined but not visualized Neck: supple, no paraspinal tenderness, full range of motion Heart:  Regular rate and rhythm Lungs:  Clear to auscultation bilaterally Back: No paraspinal tenderness Neurological Exam: alert and oriented to person, place, and time. Attention span and concentration intact, recent and remote memory intact, fund of knowledge intact.  Speech fluent and not dysarthric, expressive aphasia.  Decreased right V1-V2 distribution, right lower facial weakness.  Otherwise, CN II-XII intact. Increased tone on right.  Muscle strength 5-/5 right deltoid, 4+/5 right triceps, right grip weakness, 5-/5 right proximal leg.  Otherwise, 5/5.  Sensation to light touch reduced on right upper and lower extremities.  Deep tendon reflexes 3+ right upper and lower extremities, 2+ left upper and lower extremities.  Finger to nose testing intact.  Slight right hemiplegic gait.  IMPRESSION: 1.  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  Symptomatic seizure secondary to stroke, no recurrence.  Although an isolated event, given that she has a potential epileptic focus in the area of prior stroke, I would continue seizure prophylaxis. 3.  Hypertension 4.  Type 2 diabetes mellitus 5.  Hyperlipidemia 6.  Episodic tension-type headache, not intractable, stable  PLAN: 1.  Keppra 500 mg twice daily 2.  Secondary stroke prevention:  Aspirin 81 mg daily  Optimize glycemic control (hemoglobin A1c less than 7)  Statin therapy (LDL goal less than 70)  Continue blood pressure control 3.  Follow-up in 1 year  Metta Clines, DO  CC: Darrelyn Hillock, DO

## 2019-07-20 ENCOUNTER — Ambulatory Visit: Payer: Medicaid Other | Admitting: Neurology

## 2019-08-05 ENCOUNTER — Ambulatory Visit (INDEPENDENT_AMBULATORY_CARE_PROVIDER_SITE_OTHER): Payer: Medicaid Other | Admitting: Family Medicine

## 2019-08-05 ENCOUNTER — Encounter: Payer: Self-pay | Admitting: Family Medicine

## 2019-08-05 ENCOUNTER — Other Ambulatory Visit: Payer: Self-pay

## 2019-08-05 VITALS — BP 108/60 | HR 89 | Ht 62.0 in | Wt 138.2 lb

## 2019-08-05 DIAGNOSIS — Z1231 Encounter for screening mammogram for malignant neoplasm of breast: Secondary | ICD-10-CM

## 2019-08-05 DIAGNOSIS — I10 Essential (primary) hypertension: Secondary | ICD-10-CM | POA: Diagnosis not present

## 2019-08-05 DIAGNOSIS — I619 Nontraumatic intracerebral hemorrhage, unspecified: Secondary | ICD-10-CM

## 2019-08-05 DIAGNOSIS — Z1211 Encounter for screening for malignant neoplasm of colon: Secondary | ICD-10-CM | POA: Diagnosis not present

## 2019-08-05 DIAGNOSIS — Z0000 Encounter for general adult medical examination without abnormal findings: Secondary | ICD-10-CM | POA: Diagnosis not present

## 2019-08-05 DIAGNOSIS — Z23 Encounter for immunization: Secondary | ICD-10-CM

## 2019-08-05 DIAGNOSIS — E119 Type 2 diabetes mellitus without complications: Secondary | ICD-10-CM | POA: Diagnosis not present

## 2019-08-05 DIAGNOSIS — E785 Hyperlipidemia, unspecified: Secondary | ICD-10-CM | POA: Diagnosis not present

## 2019-08-05 DIAGNOSIS — Z794 Long term (current) use of insulin: Secondary | ICD-10-CM | POA: Diagnosis not present

## 2019-08-05 LAB — POCT GLYCOSYLATED HEMOGLOBIN (HGB A1C): HbA1c, POC (controlled diabetic range): 7.5 % — AB (ref 0.0–7.0)

## 2019-08-05 MED ORDER — EMPAGLIFLOZIN 10 MG PO TABS: 10.0000 mg | ORAL_TABLET | Freq: Every day | ORAL | 0 refills | Status: DC

## 2019-08-05 NOTE — Progress Notes (Signed)
   Subjective:    Patient ID: Amy [name], female    DOB: 1955/11/05, 63 y.o.   MRN: [id number]   CC:" Follow-up"   HPI: Amy [name] is a 63 year old female with hypertension, T2 DM, previous CVA with residual aphasia, hyperlipidemia, and seizure activity in the setting of previous CVA presenting with her daughter-in-law discuss the following:  Spanish interpreter via iPad used for duration of visit.  Daughter-in-law provided majority of history given patient's significant aphasia secondary to previous CVA.  Diabetes follow up:  Last A1c 7.6 on 09/2018 Taking medications: metformin 1000 mg twice daily and glyburide 5 mg Glucose average: Checking in the morning fasting/evening (father in law checks it and is not here today but has told daughter in law they have been "fine") .  Daughter-in-law thinks she has had several lows, usually 77s. Hypoglycemic symptoms? No Exercise: Regular basis, walking  Diet: Well balanced  On Aspirin, and on statin: Yes Last eye exam: 09/2018, has appt in 08/2019 for next exam Last foot exam: 2019 Pneumococcal vaccine? Completed  ROS: denies dizziness, diaphoresis, LOC, polyuria, polydipsia  Due to see her neurologist next month.   Health maintenance: Due for ophthalmology exam, mammogram, colonoscopy, foot exam, A1c, flu shot  Smoking status reviewed  Review of Systems Per HPI  Objective:  BP 108/60   Pulse 89   Ht 5\' 2"  (1.575 m)   Wt 138 lb 4 oz (62.7 kg)   SpO2 96%   BMI 25.29 kg/m  Vitals and nursing note reviewed  General: NAD, pleasant Cardiac: RRR, normal heart sounds, no murmurs Respiratory: CTAB, normal effort Abdomen: soft Extremities: no edema or cyanosis. WWP. Skin: warm and dry, no rashes noted Neuro: alert and oriented, 5/5 upper and lower extremity strength.  Appears to able to comprehend information, impaired language.  Assessment & Plan:    DM type 2 (diabetes mellitus, type 2) Well-controlled with A1c of 7.5.   Given history of previous CVA and few asymptomatic hypoglycemic episodes, would like to transition to Jardiance 10 mg and discontinue glyburide for further cardiovascular benefit.  Discussed associated risk and benefits, patient willing to proceed. -Start Jardiance 10 mg after obtaining baseline BMP, to let me know if this is too expensive -Stop glyburide at that time -Continue metformin 1000 twice daily -Encouraged continued physical activity and monitoring diet -Will obtain ophthalmology records, recent eye exam in 09/2018 -Will perform foot exam on follow-up -Follow-up in approximately 2 weeks for repeat BMP and then with myself in 1 month  Hyperlipidemia Currently on Crestor 20 mg.  Will recheck a lipid panel to ensure adequate therapy.  Hemorrhagic stroke History of large left MCA stroke in 2015, with residual aphasia and late effect seizures.  Will continue to risk optimize, assessing lipids today (on statin) and hypertension well controlled.  Additionally will hopefully be adding Jardiance for additional ASCVD benefit.  Due to see her neurologist next month.  Essential hypertension Well-controlled.  Continue lisinopril 20 mg.  Last creatinine 1.09 in 2018, will recheck today.  Healthcare maintenance Placed referral for colonoscopy after discussing all screening options available.  Also placed referral for mammogram, patient given information for [facility] imaging center.  Received flu shot today.    Follow-up in approximately 2 weeks for lab visit in 1 month for diabetes medication follow-up.  Family to call if they have any difficulties obtaining or side effects with Jardiance.   [place] [facility] Resident PGY-2

## 2019-08-05 NOTE — Patient Instructions (Signed)
It was wonderful seeing you today.  We are to start a medication called Jardiance for her diabetes, I want her to start this after I call you with her lab results.  When you start the Jardiance, I want her to STOP the glyburide.  Please let me know if the Amy [name] is too expensive or she has any troubles with this.  I would like you to come back in 2 weeks for a lab visit to have her kidneys checked and then follow-up myself in 1 month.

## 2019-08-05 NOTE — Assessment & Plan Note (Addendum)
History of large left MCA stroke in 2015, with residual aphasia and late effect seizures.  Will continue to risk optimize, assessing lipids today (on statin) and hypertension well controlled.  Additionally will hopefully be adding Jardiance for additional ASCVD benefit.  Due to see her neurologist next month.

## 2019-08-05 NOTE — Assessment & Plan Note (Signed)
Well-controlled.  Continue lisinopril 20 mg.  Last creatinine 1.09 in 2018, will recheck today.

## 2019-08-05 NOTE — Assessment & Plan Note (Signed)
Well-controlled with A1c of 7.5.  Given history of previous CVA and few asymptomatic hypoglycemic episodes, would like to transition to Jardiance 10 mg and discontinue glyburide for further cardiovascular benefit.  Discussed associated risk and benefits, patient willing to proceed. -Start Jardiance 10 mg after obtaining baseline BMP, to let me know if this is too expensive -Stop glyburide at that time -Continue metformin 1000 twice daily -Encouraged continued physical activity and monitoring diet -Will obtain ophthalmology records, recent eye exam in 09/2018 -Will perform foot exam on follow-up -Follow-up in approximately 2 weeks for repeat BMP and then with myself in 1 month

## 2019-08-05 NOTE — Assessment & Plan Note (Signed)
Currently on Crestor 20 mg.  Will recheck a lipid panel to ensure adequate therapy.

## 2019-08-05 NOTE — Assessment & Plan Note (Signed)
Placed referral for colonoscopy after discussing all screening options available.  Also placed referral for mammogram, patient given information for [facility] breast imaging center.  Received flu shot today.

## 2019-08-06 LAB — BASIC METABOLIC PANEL
BUN/Creatinine Ratio: 19 (ref 12–28)
BUN: 12 mg/dL (ref 8–27)
CO2: 21 mmol/L (ref 20–29)
Calcium: 9.7 mg/dL (ref 8.7–10.3)
Chloride: 105 mmol/L (ref 96–106)
Creatinine, Ser: 0.64 mg/dL (ref 0.57–1.00)
GFR calc Af Amer: 109 mL/min/{1.73_m2} (ref >59)
GFR calc non Af Amer: 95 mL/min/{1.73_m2} (ref >59)
Glucose: 162 mg/dL — ABNORMAL HIGH (ref 65–99)
Potassium: 4.5 mmol/L (ref 3.5–5.2)
Sodium: 141 mmol/L (ref 134–144)

## 2019-08-06 LAB — LIPID PANEL
Chol/HDL Ratio: 2.9 ratio (ref 0.0–4.4)
Cholesterol, Total: 108 mg/dL (ref 100–199)
HDL: 37 mg/dL — ABNORMAL LOW (ref >39)
LDL Chol Calc (NIH): 25 mg/dL (ref 0–99)
Triglycerides: 318 mg/dL — ABNORMAL HIGH (ref 0–149)
VLDL Cholesterol Cal: 46 mg/dL — ABNORMAL HIGH (ref 5–40)

## 2019-08-12 ENCOUNTER — Encounter: Payer: Self-pay | Admitting: Gastroenterology

## 2019-09-02 NOTE — Progress Notes (Signed)
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  She is accompanied by her daughter and husband who supplements history.  An interpreter is present.  UPDATE: She takes Keppra '500mg'$  twice daily for seizure prevention.  For secondary stroke prevention, she takes ASA '81mg'$  daily and Crestor '20mg'$ .  LDL and Hgb A1c from 08/05/2019 were 25 and 7.5 respectively.  No seizures.  Sometimes she feels a little dizzy if she bends over.    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  At baseline, she is aphasic, primarily expressive.She has mild dysphagia.She has right sided residual weakness.She also has residual right sided numbness.  She was hospitalized in January 2016 for headache and found to be hypertensive.CT of head showed left MCA distribution encephalomalacia, but nothing acute.Her glucose level was 400 and had a slight anion gap of 16 without ketoacidosis. In April 2016, she began experiencing new left sided numbness and tingling without new motor deficits.MRI and MRA of head performed 02/09/15 showed no new infarct.  Carotid doppler from 09/26/15 showed no hemodynamically significant stenosis.   PAST MEDICAL HISTORY: Past Medical History:  Diagnosis Date  . Brain injury ([place])    HX OF TRAUMATIC BRAIN INJURY  . Diabetes mellitus   . Disorder of vocal cord   . Dysphagia   . GERD (gastroesophageal reflux disease)   . Hypercholesteremia   . Hypertension   . Stroke ([place])   . Thyroid disease     MEDICATIONS: Current Outpatient Medications on File Prior to Visit  Medication Sig Dispense Refill  . aspirin EC 81 MG tablet Take 1 tablet (81 mg total) by mouth daily. 30 tablet 11  . Blood Glucose Monitoring Suppl (ACCU-CHEK AVIVA PLUS) w/Device KIT Please use to check blood sugars once a day. 1 kit 0  . empagliflozin (JARDIANCE) 10 MG TABS tablet Take 10 mg by mouth daily. Start after lab results return. 30 tablet 0  . glucose blood (ACCU-CHEK AVIVA) test strip 1 each by Other route daily. Use as instructed 100 each 10  . glyBURIDE (DIABETA) 5 MG tablet TAKE 2 TABLETS BY MOUTH TWICE DAILY WITH A MEAL 360 tablet 3  . levETIRAcetam (KEPPRA) 500 MG tablet Take 1 tablet (500 mg total) by mouth 2 (two) times daily. 180 tablet 2  . lisinopril (PRINIVIL,ZESTRIL) 10 MG tablet TAKE 1 TABLET BY MOUTH ONCE DAILY 90 tablet 3  . metFORMIN (GLUCOPHAGE) 500 MG tablet TAKE 2 TABLETS BY MOUTH TWICE DAILY WITH A MEAL 360 tablet 3  . rosuvastatin (CRESTOR) 20 MG tablet Take 1 tablet (20 mg total) by mouth daily. 90 tablet 3   No current facility-administered medications on file prior to visit.     ALLERGIES: Allergies  Allergen Reactions  .  Shrimp [Shellfish Allergy] Anaphylaxis  . Phenytoin Hives and Itching    FAMILY HISTORY: Family History  Problem Relation Age of Onset  . Heart Problems Father    SOCIAL HISTORY: Social History   Socioeconomic History  . Marital status: Married    Spouse name: Not on file  . Number of children: 5  . Years of education: Not on file  . Highest education level: Not on  file  Occupational History  . Not on file  Social Needs  . Financial resource strain: Not on file  . Food insecurity    Worry: Not on file    Inability: Not on file  . Transportation needs    Medical: Not on file    Non-medical: Not on file  Tobacco Use  . Smoking status: Never Smoker  . Smokeless tobacco: Never Used  Substance and Sexual Activity  . Alcohol use: No  . Drug use: No  . Sexual activity: Not on file  Lifestyle  . Physical activity    Days per week: Not on file    Minutes per session: Not on file  . Stress: Not on file  Relationships  . Social Herbalist on phone: Not on file    Gets together: Not on file    Attends religious service: Not on file    Active member of club or organization: Not on file    Attends meetings of clubs or organizations: Not on file    Relationship status: Not on file  . Intimate partner violence    Fear of current or ex partner: Not on file    Emotionally abused: Not on file    Physically abused: Not on file    Forced sexual activity: Not on file  Other Topics Concern  . Not on file  Social History Narrative   Patient lives at home with her husband and daughter in law.          REVIEW OF SYSTEMS: Constitutional: No fevers, chills, or sweats, no generalized fatigue, change in appetite Eyes: No visual changes, double vision, eye pain Ear, nose and throat: No hearing loss, ear pain, nasal congestion, sore throat Cardiovascular: No chest pain, palpitations Respiratory:  No shortness of breath at rest or with exertion, wheezes GastrointestinaI: No nausea, vomiting, diarrhea, abdominal pain, fecal incontinence Genitourinary:  No dysuria, urinary retention or frequency Musculoskeletal:  No neck pain, back pain Integumentary: No rash, pruritus, skin lesions Neurological: as above Psychiatric: No depression, insomnia, anxiety Endocrine: No palpitations, fatigue, diaphoresis, mood swings, change in appetite, change in  weight, increased thirst Hematologic/Lymphatic:  No purpura, petechiae. Allergic/Immunologic: no itchy/runny eyes, nasal congestion, recent allergic reactions, rashes  PHYSICAL EXAM: Blood pressure 130/83, pulse 83, height '5\' 1"'$  (1.549 m), weight 134 lb 12.8 oz (61.1 kg), SpO2 97 %. General: No acute distress.  Patient appears well-groomed.   Head:  Normocephalic/atraumatic Eyes:  Fundi examined but not visualized Neck: supple, no paraspinal tenderness, full range of motion Heart:  Regular rate and rhythm Lungs:  Clear to auscultation bilaterally Back: No paraspinal tenderness Neurological Exam: alert and oriented to person, place, and time. Attention span and concentration intact, recent and remote memory intact, fund of knowledge intact.  Speech fluent and not dysarthric, expressive greater than receptive aphasia.  Decreased right V1-V2, right lower facial weakness.  Otherwise, CN II-XII intact. Increased right upper and lower extremity tone, muscle strength 5-/5 right deltoid, 4+/5 right triceps, right grip plegic, 5-/5 right proximal leg.  Otherwise  5/5..  Sensation to light touch decreased on right.  Deep tendon reflexes 3+ right upper and lower extremities, 2+ left upper and lower extremities.  Finger to nose intact.  Slight right hemiparetic gait.  IMPRESSION: 1.  Residual right spastic hemiplegia and expressive greater than receptive aphasia secondary to left MCA stroke 2.  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  Hypertension 4.  Hyperlipidemia 5.  Type 2 diabetes mellitus   PLAN: 1.  Keppra '500mg'$  twice daily for seizure prophylaxis 2.  Secondary stroke prevention as managed by PCP  ASA '81mg'$  daily  Statin therapy (LDL goal less than 70.  Consider decreasing Crestor to '10mg'$  as LDL is currently quite low which may increase risk of bleeding, maybe aim for greater than 40)  Optimize glycemic control (Hgb A1c goal less than 7)  Blood pressure control 3.  Follow up  in one year  25 minutes spent face to face with patient, over 50% spent discussing management.   Metta Clines, DO

## 2019-09-04 ENCOUNTER — Other Ambulatory Visit: Payer: Self-pay

## 2019-09-04 ENCOUNTER — Ambulatory Visit (AMBULATORY_SURGERY_CENTER): Payer: Medicaid Other | Admitting: *Deleted

## 2019-09-04 ENCOUNTER — Encounter: Payer: Self-pay | Admitting: Neurology

## 2019-09-04 ENCOUNTER — Other Ambulatory Visit: Payer: Self-pay | Admitting: Family Medicine

## 2019-09-04 ENCOUNTER — Ambulatory Visit (INDEPENDENT_AMBULATORY_CARE_PROVIDER_SITE_OTHER): Payer: Medicaid Other | Admitting: Neurology

## 2019-09-04 VITALS — Temp 97.3°F | Ht 62.0 in | Wt 134.0 lb

## 2019-09-04 VITALS — BP 130/83 | HR 83 | Ht 61.0 in | Wt 134.8 lb

## 2019-09-04 DIAGNOSIS — E119 Type 2 diabetes mellitus without complications: Secondary | ICD-10-CM | POA: Diagnosis not present

## 2019-09-04 DIAGNOSIS — R569 Unspecified convulsions: Secondary | ICD-10-CM

## 2019-09-04 DIAGNOSIS — I69398 Other sequelae of cerebral infarction: Secondary | ICD-10-CM | POA: Diagnosis not present

## 2019-09-04 DIAGNOSIS — Z1159 Encounter for screening for other viral diseases: Secondary | ICD-10-CM

## 2019-09-04 DIAGNOSIS — I69320 Aphasia following cerebral infarction: Secondary | ICD-10-CM | POA: Diagnosis not present

## 2019-09-04 DIAGNOSIS — Z794 Long term (current) use of insulin: Secondary | ICD-10-CM

## 2019-09-04 DIAGNOSIS — E785 Hyperlipidemia, unspecified: Secondary | ICD-10-CM | POA: Diagnosis not present

## 2019-09-04 DIAGNOSIS — Z1211 Encounter for screening for malignant neoplasm of colon: Secondary | ICD-10-CM

## 2019-09-04 MED ORDER — EMPAGLIFLOZIN 10 MG PO TABS: 10.0000 mg | ORAL_TABLET | Freq: Every day | ORAL | 0 refills | Status: DC

## 2019-09-04 MED ORDER — SUPREP BOWEL PREP KIT 17.5-3.13-1.6 GM/177ML PO SOLN: 1.0000 | Freq: Once | ORAL | 0 refills | Status: AC

## 2019-09-04 NOTE — Telephone Encounter (Signed)
Pt came in office to request refill of: Jardiance  Name of Medication(s):  Amy [name] Last date of OV:  08/05/2019 Pharmacy:  [address]  Will route refill request to Clinic RN.  Discussed with patient policy to call pharmacy for future refills.  Also, discussed refills may take up to 48 hours to approve or deny.  Amy [name]  Pt stated she only have few pills left

## 2019-09-04 NOTE — Progress Notes (Signed)
GRANDDAUGHTER - JOSELIN Remmert- TEMP 97.3 HERETO INTERPRET FOR PT- DECLINED INTERPRETER- PT SIGNED RELEASE OF RESPONSIBILITY FOR INTERPRETATION SHEET- PT DOES NOT SPEAK FROM CVA- JOSELIN ANSWERED ALL QUESTIONS WITH  PT IN PV TODAY- PT SHAKES HEAD YES OR NO TO QUESTIONS  And she does seem to understand today in PV  COV TEST 12-1 Tuesday   No egg or soy allergy known to patient  No issues with past sedation with any surgeries  or procedures, no intubation problems  No diet pills per patient No home 02 use per patient  No blood thinners per patient  Pt STATES  issues with constipation IS RARE No A fib or A flutter  EMMI video sent to pt's e mail   Due to the COVID-19 pandemic we are asking patients to follow these guidelines. Please only bring one care partner. Please be aware that your care partner may wait in the car in the parking lot or if they feel like they will be too hot to wait in the car, they may wait in the lobby on the 4th floor. All care partners are required to wear a mask the entire time (we do not have any that we can provide them), they need to practice social distancing, and we will do a Covid check for all patient's and care partners when you arrive. Also we will check their temperature and your temperature. If the care partner waits in their car they need to stay in the parking lot the entire time and we will call them on their cell phone when the patient is ready for discharge so they can bring the car to the front of the building. Also all patient's will need to wear a mask into building.

## 2019-09-04 NOTE — Patient Instructions (Signed)
1.  Continue levetiracetam 500mg  twice daily for seizure prevention 2.  Continue aspirin and medications for blood pressure, cholesterol and diabetes control 3.  Follow up in one year or as needed.

## 2019-09-07 ENCOUNTER — Ambulatory Visit: Payer: Medicaid Other | Admitting: Family Medicine

## 2019-09-08 ENCOUNTER — Telehealth (INDEPENDENT_AMBULATORY_CARE_PROVIDER_SITE_OTHER): Payer: Medicaid Other | Admitting: Family Medicine

## 2019-09-08 ENCOUNTER — Other Ambulatory Visit: Payer: Self-pay

## 2019-09-08 DIAGNOSIS — E785 Hyperlipidemia, unspecified: Secondary | ICD-10-CM

## 2019-09-08 DIAGNOSIS — E119 Type 2 diabetes mellitus without complications: Secondary | ICD-10-CM

## 2019-09-08 MED ORDER — GLYBURIDE 5 MG PO TABS: ORAL_TABLET | ORAL | 0 refills | Status: DC

## 2019-09-08 MED ORDER — ROSUVASTATIN CALCIUM 10 MG PO TABS: 10.0000 mg | ORAL_TABLET | Freq: Every day | ORAL | 0 refills | Status: DC

## 2019-09-08 NOTE — Progress Notes (Signed)
Virtual Visit via Telephone Note  I connected with Amy [name] on 09/08/19 at  3:30 PM EST by telephone and verified that I am speaking with the correct person using two identifiers.  Location: Patient: at home Provider: [facility] clinic (using spanish interpretor)   I discussed the limitations, risks, security and privacy concerns of performing an evaluation and management service by telephone and the availability of in person appointments. I also discussed with the patient that there may be a patient responsible charge related to this service. The patient expressed understanding and agreed to proceed.  History of Present Illness: DM type 2 (diabetes mellitus, type 2) Family said they have been having trouble with Jardiance, they think that it has messed up the blood sugars for their mother and they demand to stop it.  They want to go back to glimepiride which the current plan had been from the primary care physician to transition to [facility] and stop the glyburide.   Hyperlipidemia Per note from neurologist patient's cholesterol seems to be much lower than goal and they suggested decreasing statin   Observations/Objective: Discussion was with family, the patient's daughter, because the patient does not communicate well  Assessment and Plan: DM type 2 (diabetes mellitus, type 2) Family said they have been having trouble with Jardiance, they think that it has messed up the blood sugars for their mother and they demand to stop it.  They want to go back to glimepiride which the current plan had been from the primary care physician to transition to [facility] and stop the glyburide.  I told him that I think it is better for them to have this discussion with their primary care doctor and have scheduled an appointment with them.  I did refill the glyburide long enough to get them through that appointment but told him that I was not authorizing a long-term change back to glyburide they would need  to discuss that with Dr. Higinio Plan.  Hyperlipidemia Per note from neurologist patient's cholesterol seems to be much lower than goal and they suggested decreasing statin  Reduce rosuvastatin to 10    Follow Up Instructions:    I discussed the assessment and treatment plan with the patient. The patient was provided an opportunity to ask questions and all were answered. The patient agreed with the plan and demonstrated an understanding of the instructions.   The patient was advised to call back or seek an in-person evaluation if the symptoms worsen or if the condition fails to improve as anticipated.  I provided 15 minutes of non-face-to-face time during this encounter.   Sherene Sires, DO

## 2019-09-12 NOTE — Assessment & Plan Note (Signed)
Per note from neurologist patient's cholesterol seems to be much lower than goal and they suggested decreasing statin  Reduce rosuvastatin to 10

## 2019-09-12 NOTE — Assessment & Plan Note (Signed)
Family said they have been having trouble with Jardiance, they think that it has messed up the blood sugars for their mother and they demand to stop it.  They want to go back to glimepiride which the current plan had been from the primary care physician to transition to [facility] and stop the glyburide.  I told him that I think it is better for them to have this discussion with their primary care doctor and have scheduled an appointment with them.  I did refill the glyburide long enough to get them through that appointment but told him that I was not authorizing a long-term change back to glyburide they would need to discuss that with Dr. Higinio Plan.

## 2019-09-16 ENCOUNTER — Ambulatory Visit (INDEPENDENT_AMBULATORY_CARE_PROVIDER_SITE_OTHER): Payer: Medicaid Other

## 2019-09-16 ENCOUNTER — Other Ambulatory Visit: Payer: Self-pay | Admitting: Gastroenterology

## 2019-09-16 DIAGNOSIS — Z1159 Encounter for screening for other viral diseases: Secondary | ICD-10-CM

## 2019-09-17 LAB — SARS CORONAVIRUS 2 (TAT 6-24 HRS): SARS Coronavirus 2: NEGATIVE

## 2019-09-18 ENCOUNTER — Encounter: Payer: Self-pay | Admitting: Gastroenterology

## 2019-09-18 ENCOUNTER — Other Ambulatory Visit: Payer: Self-pay

## 2019-09-18 ENCOUNTER — Ambulatory Visit (AMBULATORY_SURGERY_CENTER): Payer: Medicaid Other | Admitting: Gastroenterology

## 2019-09-18 VITALS — BP 123/68 | HR 76 | Temp 97.8°F | Resp 19 | Ht 62.0 in | Wt 134.0 lb

## 2019-09-18 DIAGNOSIS — K635 Polyp of colon: Secondary | ICD-10-CM | POA: Diagnosis not present

## 2019-09-18 DIAGNOSIS — D122 Benign neoplasm of ascending colon: Secondary | ICD-10-CM

## 2019-09-18 DIAGNOSIS — Z1211 Encounter for screening for malignant neoplasm of colon: Secondary | ICD-10-CM | POA: Diagnosis not present

## 2019-09-18 MED ORDER — SODIUM CHLORIDE 0.9 % IV SOLN: 500.0000 mL | Freq: Once | INTRAVENOUS | Status: DC

## 2019-09-18 NOTE — Op Note (Signed)
[place] Patient Name: Amy [name] Procedure Date: 09/18/2019 10:57 AM MRN: [id number] Endoscopist: Thornton Park MD, MD Age: 63 Referring MD:  Date of Birth: 22-Mar-1956 Gender: Female Account #: [id number] Procedure:                Colonoscopy Indications:              Screening for colorectal malignant neoplasm, This                            is the patient's first colonoscopy                           No known family history of colon cancer or polyps                           No baseline GI symptoms Medicines:                Monitored Anesthesia Care Procedure:                Pre-Anesthesia Assessment:                           - Prior to the procedure, a History and Physical                            was performed, and patient medications and                            allergies were reviewed. The patient's tolerance of                            previous anesthesia was also reviewed. The risks                            and benefits of the procedure and the sedation                            options and risks were discussed with the patient.                            All questions were answered, and informed consent                            was obtained. Prior Anticoagulants: The patient has                            taken no previous anticoagulant or antiplatelet                            agents. ASA Grade Assessment: III - A patient with                            severe systemic disease. After reviewing the risks  and benefits, the patient was deemed in                            satisfactory condition to undergo the procedure.                           After obtaining informed consent, the colonoscope                            was passed under direct vision. Throughout the                            procedure, the patient's blood pressure, pulse, and                            oxygen saturations were monitored  continuously. The                            Colonoscope was introduced through the anus and                            advanced to the the terminal ileum, with                            identification of the appendiceal orifice and IC                            valve. A second forward view of the right colon was                            performed. The colonoscopy was performed without                            difficulty. The patient tolerated the procedure                            well. The quality of the bowel preparation was                            good. The ileocecal valve, appendiceal orifice, and                            rectum were photographed. Scope In: 11:00:23 AM Scope Out: 11:18:37 AM Scope Withdrawal Time: 0 hours 14 minutes 40 seconds  Total Procedure Duration: 0 hours 18 minutes 14 seconds  Findings:                 The perianal and digital rectal examinations were                            normal.                           Multiple small and large-mouthed diverticula were  found in the entire colon. They are most pronounced                            in the sigmoid and descending colon.                           A less than 1 mm polyp was found in the ascending                            colon. The polyp was sessile. The polyp was removed                            with a cold biopsy forceps. Resection and retrieval                            were complete. Estimated blood loss was minimal.                           Non-bleeding internal hemorrhoids were found.                           The exam was otherwise without abnormality on                            direct and retroflexion views. Complications:            No immediate complications. Estimated blood loss:                            Minimal. Estimated Blood Loss:     Estimated blood loss was minimal. Impression:               - Diverticulosis in the entire examined colon.                            - One less than 1 mm polyp in the ascending colon,                            removed with a cold biopsy forceps. Resected and                            retrieved.                           - Non-bleeding internal hemorrhoids.                           - The examination was otherwise normal on direct                            and retroflexion views. Recommendation:           - Patient has a contact number available for  emergencies. The signs and symptoms of potential                            delayed complications were discussed with the                            patient. Return to normal activities tomorrow.                            Written discharge instructions were provided to the                            patient.                           - High fiber diet.                           - Continue present medications.                           - Await pathology results.                           - Repeat colonoscopy date to be determined after                            pending pathology results are reviewed for                            surveillance. Thornton Park MD, MD 09/18/2019 11:28:09 AM This report has been signed electronically.

## 2019-09-18 NOTE — Progress Notes (Signed)
[facility]-[place] Temp-JB Interpreter-Carol Hernandez  Pt's states no medical or surgical changes since previsit or office visit.  Interpreter used today at the [facility] for this pt.  Interpreter's name is- Lockie Mola

## 2019-09-18 NOTE — Progress Notes (Signed)
PT taken to PACU. Monitors in place. VSS. Report given to RN. 

## 2019-09-18 NOTE — Progress Notes (Signed)
DC instructions helped to be given by  Lockie Mola  interpreter today.

## 2019-09-18 NOTE — Progress Notes (Signed)
Called to room to assist during endoscopic procedure.  Patient ID and intended procedure confirmed with present staff. Received instructions for my participation in the procedure from the performing physician.  

## 2019-09-18 NOTE — Patient Instructions (Signed)
Please see handouts given to you on Polyps and Hemorrhoids. Thank you for letting us take care of your healthcare needs today.    USTED TUVO UN PROCEDIMIENTO ENDOSCPICO HOY EN EL  ENDOSCOPY CENTER:   Lea el informe del procedimiento que se le entreg para cualquier pregunta especfica sobre lo que se Primary school teacher.  Si el informe del examen no responde a sus preguntas, por favor llame a su gastroenterlogo para aclararlo.  Si usted solicit que no se le den [employer] de lo que se Estate manager/land agent en su procedimiento al [employer] va a cuidar, entonces el informe del procedimiento se ha incluido en un sobre sellado para que usted lo revise despus cuando le sea ms conveniente.   LO QUE PUEDE ESPERAR: Algunas sensaciones de hinchazn en el abdomen.  Puede tener ms gases de lo normal.  El caminar puede ayudarle a eliminar el aire que se le puso en el tracto gastrointestinal durante el procedimiento y reducir la hinchazn.  Si le hicieron una endoscopia inferior (como una colonoscopia o una sigmoidoscopia flexible), podra notar manchas de sangre en las heces fecales o en el papel higinico.  Si se someti a una preparacin intestinal para su procedimiento, es posible que no tenga una evacuacin intestinal normal durante [employer].   Tenga en cuenta:  Es posible que note un poco de irritacin y congestin en la nariz o algn drenaje.  Esto es debido al oxgeno [employer] durante su procedimiento.  No hay que preocuparse y esto debe desaparecer ms o Scientist, research (medical).   SNTOMAS PARA REPORTAR INMEDIATAMENTE:  Despus de una endoscopia inferior (colonoscopia o sigmoidoscopia flexible):  Cantidades excesivas de sangre en las heces fecales  Sensibilidad significativa o empeoramiento de los dolores abdominales   Hinchazn aguda del abdomen que antes no tena   Fiebre de 100F o ms      Para asuntos urgentes o de Freight forwarder, puede comunicarse con un gastroenterlogo a cualquier hora  llamando al [phone].  DIETA:  Recomendamos una comida pequea al principio, pero luego puede continuar con su dieta normal.  Tome muchos lquidos, Teacher, adult education las bebidas alcohlicas durante 24 horas.    ACTIVIDAD:  Debe planear tomarse las cosas con calma por el resto del da y no debe CONDUCIR ni usar maquinaria pesada Programmer, applications (debido a los medicamentos de sedacin utilizados durante el examen).     SEGUIMIENTO: Nuestro personal llamar al nmero que aparece en su historial al siguiente da hbil de su procedimiento para ver cmo se siente y para responder cualquier pregunta o inquietud que pueda tener con respecto a la informacin que se le dio despus del procedimiento. Si no podemos contactarle, le dejaremos un mensaje.  Sin embargo, si se siente bien y no tiene Paediatric nurse, no es necesario que nos devuelva la llamada.  Asumiremos que ha regresado a sus actividades diarias normales sin incidentes. Si se le tomaron algunas biopsias, le contactaremos por telfono o por carta en las prximas 3 semanas.  Si no ha sabido [employer] biopsias en el transcurso de 3 semanas, por favor llmenos al [phone].   FIRMAS/CONFIDENCIALIDAD: Usted y/o el acompaante que le cuide han firmado documentos que se ingresarn en su historial mdico electrnico.  Estas firmas atestiguan el hecho de que la informacin anterior

## 2019-09-22 ENCOUNTER — Telehealth: Payer: Self-pay | Admitting: *Deleted

## 2019-09-22 ENCOUNTER — Telehealth: Payer: Self-pay

## 2019-09-22 NOTE — Telephone Encounter (Signed)
Attempted to reach patient for post-procedure f/u call. No answer. Left message that we will make another attempt to reach her again later today and for her to please not hesitate to call us if she has any questions/concerns regarding her care. 

## 2019-09-22 NOTE — Telephone Encounter (Signed)
Message left

## 2019-09-23 ENCOUNTER — Ambulatory Visit: Payer: Medicaid Other | Admitting: Family Medicine

## 2019-09-23 ENCOUNTER — Encounter: Payer: Self-pay | Admitting: Gastroenterology

## 2019-09-25 ENCOUNTER — Other Ambulatory Visit: Payer: Self-pay

## 2019-09-25 ENCOUNTER — Encounter: Payer: Self-pay | Admitting: Family Medicine

## 2019-09-25 ENCOUNTER — Ambulatory Visit (INDEPENDENT_AMBULATORY_CARE_PROVIDER_SITE_OTHER): Payer: Medicaid Other | Admitting: Family Medicine

## 2019-09-25 DIAGNOSIS — Z794 Long term (current) use of insulin: Secondary | ICD-10-CM

## 2019-09-25 DIAGNOSIS — E119 Type 2 diabetes mellitus without complications: Secondary | ICD-10-CM

## 2019-09-25 DIAGNOSIS — E785 Hyperlipidemia, unspecified: Secondary | ICD-10-CM | POA: Diagnosis not present

## 2019-09-25 DIAGNOSIS — I10 Essential (primary) hypertension: Secondary | ICD-10-CM

## 2019-09-25 MED ORDER — LISINOPRIL 10 MG PO TABS: 10.0000 mg | ORAL_TABLET | Freq: Every day | ORAL | 1 refills | Status: DC

## 2019-09-25 NOTE — Progress Notes (Signed)
   Subjective:    Patient ID: Amy [name], female    DOB: 1955-11-17, 63 y.o.   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  Followed up with Dr. Criss Rosales on 11/24 due to unwanted side effects with Jardiance, ultimately stop this and restarted glyburide.  Stated she felt dizzy and tired when she was taking Jardiance.  Doing well now that she is back on glyburide.  Denies any significant lows, however mindful of the daughter-in-law thought that maybe she had had some when I last met with her in 07/2019.  Does not have glucose chart with her today.   Her granddaughter was present with her today and translated for Spanish.  Declined formal Spanish interpreter.   Smoking status reviewed  Review of Systems Per HPI    Objective:  BP 106/64   Pulse 74   Wt 136 lb (61.7 kg)   SpO2 97%   BMI 24.87 kg/m  Vitals and nursing note reviewed  General: NAD, pleasant Cardiac: RRR, normal heart sounds, no murmurs Respiratory: CTAB, normal effort Abdomen: soft Extremities: no edema or cyanosis. WWP. Skin: warm and dry, no rashes noted Neuro: alert and oriented, no focal deficits Psych: normal affect  Diabetic Foot Exam - Simple   Simple Foot Form Diabetic Foot exam was performed with the following findings: Yes 09/25/2019  5:48 PM  Visual Inspection No deformities, no ulcerations, no other skin breakdown bilaterally: Yes Sensation Testing Intact to touch and monofilament testing bilaterally: Yes See comments: Yes Pulse Check Posterior Tibialis and Dorsalis pulse intact bilaterally: Yes Comments Mild decrease in sensation to monofilament testament on the right compared to left, history of severe hemorrhagic stroke resulting in left-sided deficits.     Assessment &  Plan:   DM type 2 (diabetes mellitus, type 2) Recently discontinued Jardiance due to side effects including dizziness and fatigue, largely suspect this was in the setting of decreased blood pressure as her baseline maintains around SBP 100.  Discussed the option of retrying Jardiance now or in the future and decreasing her BP medication at the same time, she opted to wait until follow-up to reconsider starting this.  Will highly encourage this on follow-up given her history and associated CV benefits.  We will continue with glyburide and metformin as is for now, granddaughter to call if she notes any low glucose in her journal and follow-up sooner. -Tried to obtain ophthalmology release of records, granddaughter is not familiar with the number for ophthalmologist and will call back to the clinic after speaking with her grandfather -Follow-up in approximately 1 month for diabetes follow-up, A1c at that time, goal < 7  Essential hypertension Well-controlled.  Continue with lisinopril 10 mg.  Hyperlipidemia Decreased Crestor to 10 mg 2 weeks ago due to significantly low LDL.  Will consider recheck in the next 1-2 months.   Follow-up in 1 month for above or sooner if needed  [place] Resident PGY-2

## 2019-09-25 NOTE — Patient Instructions (Signed)
It was wonderful to see you guys today.  We will continue for the glipizide for now, however would like to readdress starting Jardiance on a future visit as I believe this would be very beneficial for her.  If looking through her sugar log to see several lows including blood sugars under 70 or she is symptomatic frequently with feeling fatigued, lightheadedness/dizziness, tunnel vision, sweats--please follow-up sooner.  I would like to see you guys back in approximately 1 month or so for follow-up of her diabetes.  Please bring her sugar log at that visit.

## 2019-09-25 NOTE — Assessment & Plan Note (Signed)
Decreased Crestor to 10 mg 2 weeks ago due to significantly low LDL.  Will consider recheck in the next 1-2 months.

## 2019-09-25 NOTE — Assessment & Plan Note (Signed)
Well-controlled.  Continue with lisinopril 10 mg.

## 2019-09-25 NOTE — Assessment & Plan Note (Addendum)
Recently discontinued Jardiance due to side effects including dizziness and fatigue, largely suspect this was in the setting of decreased blood pressure as her baseline maintains around SBP 100.  Discussed the option of retrying Jardiance now or in the future and decreasing her BP medication at the same time, she opted to wait until follow-up to reconsider starting this.  Will highly encourage this on follow-up given her history and associated CV benefits.  We will continue with glyburide and metformin as is for now, granddaughter to call if she notes any low glucose in her journal and follow-up sooner. -Tried to obtain ophthalmology release of records, granddaughter is not familiar with the number for ophthalmologist and will call back to the clinic after speaking with her grandfather -Follow-up in approximately 1 month for diabetes follow-up, A1c at that time, goal < 7

## 2019-09-29 ENCOUNTER — Telehealth: Payer: Self-pay | Admitting: *Deleted

## 2019-09-29 NOTE — Telephone Encounter (Signed)
Pts son calls to ask for a refill on "new medicine".  After a 10 minute conversation I was able to figure out that they were requesting a refill on Jardiance.  Son has convinced mom that she needs to take what the doctor wants her to.   Please send refill to [facility] on [address]. Christen Bame, CMA

## 2019-10-02 ENCOUNTER — Ambulatory Visit (INDEPENDENT_AMBULATORY_CARE_PROVIDER_SITE_OTHER): Payer: Medicaid Other | Admitting: Family Medicine

## 2019-10-02 ENCOUNTER — Other Ambulatory Visit: Payer: Self-pay

## 2019-10-02 VITALS — BP 118/74 | HR 78 | Wt 136.0 lb

## 2019-10-02 DIAGNOSIS — Z794 Long term (current) use of insulin: Secondary | ICD-10-CM | POA: Diagnosis not present

## 2019-10-02 DIAGNOSIS — E119 Type 2 diabetes mellitus without complications: Secondary | ICD-10-CM

## 2019-10-02 DIAGNOSIS — Z7189 Other specified counseling: Secondary | ICD-10-CM | POA: Diagnosis not present

## 2019-10-02 NOTE — Progress Notes (Signed)
   Subjective:    Patient ID: Amy [name], female    DOB: 04/03/1956, 63 y.o.   MRN: [id number]   CC: discuss Jardiance, advanced directives   HPI: [name] is a 63 year old female with a history of hemorrhagic stroke with subsequent aphasia, diabetes, hypertension, and seizures presenting with her daughter-in-law to discuss the following:  Diabetes: Recently met on 12/11 and discussed giving a Jardiance another try, but however she wanted to wait.  After discussion with her son, ended up restarting Jardiance 5 days ago.  She is seeming to tolerate it well thus far.  Previously she felt a little lightheaded and tired when she was taking this medication.  Also brought in glucose journal today showing her fasting glucose usually is around 130-150s in the morning.  Stopped glyburide.  Advanced directives: Daughter-in-law brought in [facility] health advanced directive packets and was curious to learn more information about these.  Packet was also in [place] making this more challenging for her as well.  Discussed meaning of a living will and healthcare power of attorney.  She also had a recent colonoscopy, showed a sessile polyp that was biopsied and benign.  Discussed these results with her as they had not received the letter in the mail yet.  She will be due for colonoscopy in 5 years.  Spanish interpreter was used for the duration of her visit.   Smoking status reviewed  Review of Systems Per HPI    Objective:  BP 118/74   Pulse 78   Wt 136 lb (61.7 kg)   SpO2 99%   BMI 24.87 kg/m  Vitals and nursing note reviewed  General: NAD, pleasant, daughter-in-law sitting next to her Respiratory: Unlabored breathing Extremities: no edema MSK: Normal gait. Psych: normal affect  Assessment & Plan:   DM type 2 (diabetes mellitus, type 2) After discussion with her family, had change her mind and restarted Jardiance/stopped glyburide.  Discussed as she felt more fatigued and  lightheaded during her previous trial, suspect her blood pressure may be getting too low with concurrent lisinopril as her SBP is usually around 100.  Recommended decreasing her lisinopril to half a tablet, 5 mg total, and see if this helps mitigate her previous symptoms.  Will have her follow-up in 1 month for repeat BMP and monitor BP, sooner if needed or having problems with medication.  Encounter for counseling regarding advance directives Discussed documentation and reasoning for a living will and healthcare power of attorney.  Provided them with an advance directive packet in [place].  Additionally let them know that we have notary's within our clinic that can help for final documentation.   Follow-up in 1 month as discussed above or sooner if needed  [place] Resident PGY-2

## 2019-10-02 NOTE — Patient Instructions (Signed)
It was so wonderful to see you today.  Continue the Jardiance as is and make sure that you decrease your lisinopril to 5 mg daily, that would be a half a tablet.  Additionally as you fill out the advance directives for her, you can always bring it to our office and we have a notary who can notarized your documents.  I will have you guys follow-up in approximately 1 month to see she how she is doing with this medication can see how her diabetes is doing.  Goal for sugar would be around 120 in the morning and no lower than 80 on a regular basis.

## 2019-10-02 NOTE — Assessment & Plan Note (Addendum)
After discussion with her family, had change her mind and restarted Jardiance/stopped glyburide.  Discussed as she felt more fatigued and lightheaded during her previous trial, suspect her blood pressure may be getting too low with concurrent lisinopril as her SBP is usually around 100.  Recommended decreasing her lisinopril to half a tablet, 5 mg total, and see if this helps mitigate her previous symptoms.  Will have her follow-up in 1 month for repeat BMP and monitor BP, sooner if needed or having problems with medication.

## 2019-10-02 NOTE — Assessment & Plan Note (Addendum)
Discussed documentation and reasoning for a living will and healthcare power of attorney.  Provided them with an advance directive packet in [place].  Additionally let them know that we have notary's within our clinic that can help for final documentation.

## 2019-10-27 ENCOUNTER — Ambulatory Visit (INDEPENDENT_AMBULATORY_CARE_PROVIDER_SITE_OTHER): Payer: Medicaid Other | Admitting: Family Medicine

## 2019-10-27 ENCOUNTER — Encounter: Payer: Self-pay | Admitting: Family Medicine

## 2019-10-27 ENCOUNTER — Other Ambulatory Visit: Payer: Self-pay

## 2019-10-27 VITALS — BP 120/68 | HR 56 | Wt 134.4 lb

## 2019-10-27 DIAGNOSIS — E119 Type 2 diabetes mellitus without complications: Secondary | ICD-10-CM

## 2019-10-27 DIAGNOSIS — R569 Unspecified convulsions: Secondary | ICD-10-CM | POA: Diagnosis not present

## 2019-10-27 DIAGNOSIS — Z0000 Encounter for general adult medical examination without abnormal findings: Secondary | ICD-10-CM | POA: Diagnosis not present

## 2019-10-27 DIAGNOSIS — I10 Essential (primary) hypertension: Secondary | ICD-10-CM | POA: Diagnosis not present

## 2019-10-27 DIAGNOSIS — E785 Hyperlipidemia, unspecified: Secondary | ICD-10-CM | POA: Diagnosis not present

## 2019-10-27 MED ORDER — LEVETIRACETAM 500 MG PO TABS: 500.0000 mg | ORAL_TABLET | Freq: Two times a day (BID) | ORAL | 2 refills | Status: DC

## 2019-10-27 MED ORDER — LISINOPRIL 5 MG PO TABS: 5.0000 mg | ORAL_TABLET | Freq: Every day | ORAL | 1 refills | Status: DC

## 2019-10-27 MED ORDER — ROSUVASTATIN CALCIUM 10 MG PO TABS: 10.0000 mg | ORAL_TABLET | Freq: Every day | ORAL | 1 refills | Status: DC

## 2019-10-27 MED ORDER — EMPAGLIFLOZIN 10 MG PO TABS: 10.0000 mg | ORAL_TABLET | Freq: Every day | ORAL | 1 refills | Status: DC

## 2019-10-27 MED ORDER — METFORMIN HCL 500 MG PO TABS: ORAL_TABLET | ORAL | 3 refills | Status: DC

## 2019-10-27 NOTE — Patient Instructions (Signed)
It was wonderful to see you today as always.  We will continue regimen as is.  I would like you to follow-up in approximately 2 months to check up on your diabetes.  Please keep checking your sugar about every other day or as needed for any symptoms of feeling lightheaded/dizzy.  Also recommend you check your blood pressure every couple of days as you have been doing.   If you notice your sugar numbers consistently rising or your blood pressure consistently over 130/80, please let me know and we will have you follow-up sooner.

## 2019-10-27 NOTE — Assessment & Plan Note (Signed)
Follows with neurology.  Managed on Keppra 500 mg twice daily, refill sent to her pharmacy.

## 2019-10-27 NOTE — Assessment & Plan Note (Signed)
Continue on Crestor 10 mg.  Will recheck a lipid panel on follow-up as her Crestor was recently decreased due to significantly low LDL.

## 2019-10-27 NOTE — Assessment & Plan Note (Signed)
Filled out release of records form to get her diabetic eye exam performed recently.

## 2019-10-27 NOTE — Progress Notes (Signed)
   Subjective:    Patient ID: Amy [name], female    DOB: 13-Jul-1956, 64 y.o.   MRN: [id number]   CC: Diabetes follow-up  HPI: Ms. Sparlin is a 64 year old female with a history of hemorrhagic stroke with subsequent aphasia, diabetes, hypertension, and seizures presenting with her son to discuss the following:  Diabetes: Last A1c 7.5 in 08/05/2019.  She recently started on Jardiance in 09/2019 and discontinued glyburide at that time.  We also decreased her lisinopril to 5 mg from 10 with the addition of Jardiance.  Since that time she has been doing quite well, has tolerated Jardiance without any significant side effects including denies any lightheadedness/dizziness, nausea.  She has been checking her fasting glucose on a daily basis, generally ranging around 120-140 with the lowest of 109.  Her BP has been ranging [id number] systolic and 0000000 diastolic at home.  Denies any headaches, chest pains, shortness of breath, numbness/tingling.  She needs refills of all of her medications.  Spanish interpreter via iPad was used for the duration of the visit.   Smoking status reviewed  Review of Systems Per HPI    Objective:  BP 120/68   Pulse (!) 56   Wt 134 lb 6.4 oz (61 kg)   SpO2 98%   BMI 24.58 kg/m  Vitals and nursing note reviewed 120/68   General: NAD, pleasant, accompanied by her son Cardiac: RRR, normal heart sounds, no murmurs Respiratory: CTAB, normal effort Skin: warm and dry, no rashes noted Neuro: alert and oriented Psych: normal affect  Assessment & Plan:    DM type 2 (diabetes mellitus, type 2) Controlled.  Currently maintained on Metformin and recently started Jardiance one month ago with appropriate glucose readings at home, tolerating this regimen well.  Will repeat BMP to ensure appropriate creatinine and have her follow-up in 2 months for her diabetes/A1c check.  Since she just started the Jardiance/discontinued glyburide 1 month ago, feel it would be  unnecessary to recheck an A1c today (despite being due for this) especially after glucose range at home are quite reassuring.  Healthcare maintenance Filled out release of records form to get her diabetic eye exam performed recently.  Essential hypertension Well-controlled.  Currently on lisinopril 5 mg.   Hyperlipidemia Continue on Crestor 10 mg.  Will recheck a lipid panel on follow-up as her Crestor was recently decreased due to significantly low LDL.   Seizures Follows with neurology.  Managed on Keppra 500 mg twice daily, refill sent to her pharmacy.   Follow-up in 2 months for above or sooner if needed  [place] Resident PGY-2

## 2019-10-27 NOTE — Assessment & Plan Note (Signed)
Well-controlled.  Currently on lisinopril 5 mg.

## 2019-10-27 NOTE — Assessment & Plan Note (Signed)
Controlled.  Currently maintained on Metformin and recently started Jardiance one month ago with appropriate glucose readings at home, tolerating this regimen well.  Will repeat BMP to ensure appropriate creatinine and have her follow-up in 2 months for her diabetes/A1c check.  Since she just started the Jardiance/discontinued glyburide 1 month ago, feel it would be unnecessary to recheck an A1c today (despite being due for this) especially after glucose range at home are quite reassuring.

## 2019-10-28 LAB — BASIC METABOLIC PANEL
BUN/Creatinine Ratio: 19 (ref 12–28)
BUN: 15 mg/dL (ref 8–27)
CO2: 21 mmol/L (ref 20–29)
Calcium: 9.6 mg/dL (ref 8.7–10.3)
Chloride: 102 mmol/L (ref 96–106)
Creatinine, Ser: 0.77 mg/dL (ref 0.57–1.00)
GFR calc Af Amer: 94 mL/min/{1.73_m2} (ref >59)
GFR calc non Af Amer: 82 mL/min/{1.73_m2} (ref >59)
Glucose: 175 mg/dL — ABNORMAL HIGH (ref 65–99)
Potassium: 4.2 mmol/L (ref 3.5–5.2)
Sodium: 140 mmol/L (ref 134–144)

## 2019-10-29 ENCOUNTER — Encounter: Payer: Self-pay | Admitting: Family Medicine

## 2019-10-30 ENCOUNTER — Ambulatory Visit
Admission: RE | Admit: 2019-10-30 | Discharge: 2019-10-30 | Disposition: A | Discharge status: Home / Self Care | Payer: Medicaid Other | Source: Ambulatory Visit | Attending: Family Medicine | Admitting: Family Medicine

## 2019-10-30 ENCOUNTER — Other Ambulatory Visit: Payer: Self-pay

## 2019-10-30 DIAGNOSIS — Z1231 Encounter for screening mammogram for malignant neoplasm of breast: Secondary | ICD-10-CM

## 2019-12-18 ENCOUNTER — Other Ambulatory Visit: Payer: Self-pay

## 2019-12-18 ENCOUNTER — Encounter: Payer: Self-pay | Admitting: Family Medicine

## 2019-12-18 ENCOUNTER — Ambulatory Visit: Payer: Medicaid Other | Admitting: Family Medicine

## 2019-12-18 VITALS — BP 122/62 | HR 83 | Wt 133.8 lb

## 2019-12-18 DIAGNOSIS — Z794 Long term (current) use of insulin: Secondary | ICD-10-CM | POA: Diagnosis not present

## 2019-12-18 DIAGNOSIS — I10 Essential (primary) hypertension: Secondary | ICD-10-CM

## 2019-12-18 DIAGNOSIS — E119 Type 2 diabetes mellitus without complications: Secondary | ICD-10-CM

## 2019-12-18 LAB — POCT GLYCOSYLATED HEMOGLOBIN (HGB A1C): HbA1c, POC (controlled diabetic range): 7.6 % — AB (ref 0.0–7.0)

## 2019-12-18 MED ORDER — EMPAGLIFLOZIN 25 MG PO TABS: 25.0000 mg | ORAL_TABLET | Freq: Every day | ORAL | 0 refills | Status: DC

## 2019-12-18 NOTE — Assessment & Plan Note (Signed)
Well-controlled on lisinopril 5 mg.

## 2019-12-18 NOTE — Assessment & Plan Note (Addendum)
A1c stable at 7.6 after transition to Jardiance from glipizide.  Discussed increasing Jardiance to 25 mg to hopefully get to A1c goal of 7, educated on possible side effects.  Continue Metformin as is and check fasting CBGs every few days.  Will have her follow-up in 1 month for repeat BMP and to assess tolerability.

## 2019-12-18 NOTE — Progress Notes (Signed)
    SUBJECTIVE:   CHIEF COMPLAINT / HPI: Diabetes follow-up  Amy [name] is a 64 year old female presenting with her daughter-in-law to discuss the following:  Diabetes: Started on Jardiance 10 mg in 09/2019.  Tolerating this well, denies any dysuria, vaginal itching, nausea, lightheadedness/dizziness.  Fasting glucose in the morning on average has been about 140.  COVID vaccine: Her daughter-in-law would like to know more information about the Covid vaccine and if the patient is safe to receive this due to her comorbidities.   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  Discussed increasing Jardiance to 25 mg to hopefully get to A1c goal of 7, educated on possible side effects.  Continue Metformin as is and check fasting CBGs every few days.  Will have her follow-up in 1 month for repeat BMP and to assess tolerability.  Essential hypertension Well-controlled on lisinopril 5 mg.    COVID-19 vaccination education Recommended Ms. Figuero receive her Covid vaccine when available, especially as she would be high risk for more severe illness if she were to contract Covid.  Discussed mechanism of action, possible side effects, and location to receive this.  Provided her with [facility] health website and CDC information for further vaccine research at their leisure.   Follow-up in 1 month for above or sooner if needed.  Amy [name], [place]

## 2019-12-18 NOTE — Patient Instructions (Addendum)
Wonderful to see you! Please start the jardiance 25 mg and stop the 10mg . Please let me know if she doesn't tolerate this, she can then go back to the 10mg  if needed. Check sugar every couple of days.   [email or web address]  Also please go to [facility] website about COVID vaccine information to allow you guys to do more research as well.

## 2020-02-01 ENCOUNTER — Other Ambulatory Visit: Payer: Self-pay | Admitting: Family Medicine

## 2020-02-01 DIAGNOSIS — E119 Type 2 diabetes mellitus without complications: Secondary | ICD-10-CM

## 2020-02-01 DIAGNOSIS — Z794 Long term (current) use of insulin: Secondary | ICD-10-CM

## 2020-02-01 MED ORDER — EMPAGLIFLOZIN 25 MG PO TABS: 25.0000 mg | ORAL_TABLET | Freq: Every day | ORAL | 2 refills | Status: DC

## 2020-02-01 NOTE — Telephone Encounter (Signed)
Grandson stopped by because his grandmother needs a refill on her Jardiance 25 mg. She has changed pharmacy to [facility] [address]. She has been out and waiting for several days because [facility] kept telling them they sent [place] a request we would would not fill it. jw

## 2020-02-25 ENCOUNTER — Other Ambulatory Visit: Payer: Self-pay

## 2020-02-25 DIAGNOSIS — Z794 Long term (current) use of insulin: Secondary | ICD-10-CM

## 2020-02-25 DIAGNOSIS — E119 Type 2 diabetes mellitus without complications: Secondary | ICD-10-CM

## 2020-02-29 ENCOUNTER — Other Ambulatory Visit: Payer: Self-pay

## 2020-02-29 DIAGNOSIS — Z794 Long term (current) use of insulin: Secondary | ICD-10-CM

## 2020-02-29 DIAGNOSIS — E119 Type 2 diabetes mellitus without complications: Secondary | ICD-10-CM

## 2020-02-29 MED ORDER — EMPAGLIFLOZIN 25 MG PO TABS: 25.0000 mg | ORAL_TABLET | Freq: Every day | ORAL | 2 refills | Status: DC

## 2020-02-29 NOTE — Telephone Encounter (Signed)
Patient's son calls nurse line requesting refill on jardiance. He states that patient is out of medication and cannot wait until appointment tomorrow.   To PCP  Please advise  Talbot Grumbling, RN

## 2020-03-01 ENCOUNTER — Other Ambulatory Visit: Payer: Self-pay

## 2020-03-01 ENCOUNTER — Encounter: Payer: Self-pay | Admitting: Family Medicine

## 2020-03-01 ENCOUNTER — Ambulatory Visit (INDEPENDENT_AMBULATORY_CARE_PROVIDER_SITE_OTHER): Payer: Medicaid Other | Admitting: Family Medicine

## 2020-03-01 VITALS — BP 119/60 | HR 100 | Ht 61.14 in | Wt 130.6 lb

## 2020-03-01 DIAGNOSIS — E119 Type 2 diabetes mellitus without complications: Secondary | ICD-10-CM

## 2020-03-01 DIAGNOSIS — E785 Hyperlipidemia, unspecified: Secondary | ICD-10-CM | POA: Diagnosis not present

## 2020-03-01 DIAGNOSIS — I10 Essential (primary) hypertension: Secondary | ICD-10-CM

## 2020-03-01 DIAGNOSIS — Z794 Long term (current) use of insulin: Secondary | ICD-10-CM | POA: Diagnosis not present

## 2020-03-01 MED ORDER — METFORMIN HCL 1000 MG PO TABS: 1000.0000 mg | ORAL_TABLET | Freq: Two times a day (BID) | ORAL | 3 refills | Status: DC

## 2020-03-01 MED ORDER — ROSUVASTATIN CALCIUM 10 MG PO TABS: 10.0000 mg | ORAL_TABLET | Freq: Every day | ORAL | 3 refills | Status: DC

## 2020-03-01 MED ORDER — EMPAGLIFLOZIN 25 MG PO TABS: 25.0000 mg | ORAL_TABLET | Freq: Every day | ORAL | 2 refills | Status: DC

## 2020-03-01 NOTE — Assessment & Plan Note (Addendum)
Reasonable control, A1c 7.6 in 12/2019.  Essentially around goal, mitigating stroke risk reduction and safety with limited communication due to residual CVA aphasia.  Tolerating Jardiance 25 mg, sent in refills.  Check BMP for Cr/gfr today.  Continue Metformin as is.  Due for diabetic eye exam soon, patient aware.

## 2020-03-01 NOTE — Patient Instructions (Signed)
I have sent refills of jardiance, metformin, and crestor.   Please be mindful the metformin switched to 1 tablet twice daily (rather than 2 tablets).

## 2020-03-01 NOTE — Progress Notes (Signed)
.     SUBJECTIVE:   CHIEF COMPLAINT / HPI: Diabetes follow-up  Amy [name] is a 64 year old female presenting with her daughter-in-law to discuss the following:  Diabetes: Increased to Jardiance 25 mg in 12/2019.  She has been tolerating this well, denies any lightheadedness/dizziness, nausea, GI upset, dysuria, or vaginal irritation.  Fasting CBGs on average 140-150s, taking every couple of days.  Eats well-balanced diet with vegetables and lean meats.  Hypertension: On lisinopril 5 mg.  SBP usually 0000000, diastolics [id number].   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  Essentially around goal, mitigating stroke risk reduction and safety with limited communication due to residual CVA aphasia.  Tolerating Jardiance 25 mg, sent in refills.  Check BMP for Cr/gfr today.  Continue Metformin as is.  Due for diabetic eye exam soon, patient aware.  Essential hypertension Well-controlled on lisinopril 5 mg, home readings around [id number] systolic.  Continue current regimen.  Hyperlipidemia Secondary prevention on Crestor 10 mg, decreased by her neurologist due to significantly low LDL.  Continue current regimen, sent in refills.    Follow-up in 3 months for diabetes management or sooner if needed.  Patriciaann Clan, [place]

## 2020-03-01 NOTE — Assessment & Plan Note (Signed)
Well-controlled on lisinopril 5 mg, home readings around [id number] systolic.  Continue current regimen.

## 2020-03-01 NOTE — Assessment & Plan Note (Signed)
Secondary prevention on Crestor 10 mg, decreased by her neurologist due to significantly low LDL.  Continue current regimen, sent in refills.

## 2020-03-02 ENCOUNTER — Encounter: Payer: Self-pay | Admitting: Family Medicine

## 2020-03-02 LAB — BASIC METABOLIC PANEL
BUN/Creatinine Ratio: 19 (ref 12–28)
BUN: 14 mg/dL (ref 8–27)
CO2: 19 mmol/L — ABNORMAL LOW (ref 20–29)
Calcium: 9.6 mg/dL (ref 8.7–10.3)
Chloride: 100 mmol/L (ref 96–106)
Creatinine, Ser: 0.75 mg/dL (ref 0.57–1.00)
GFR calc Af Amer: 97 mL/min/{1.73_m2} (ref >59)
GFR calc non Af Amer: 85 mL/min/{1.73_m2} (ref >59)
Glucose: 188 mg/dL — ABNORMAL HIGH (ref 65–99)
Potassium: 4.8 mmol/L (ref 3.5–5.2)
Sodium: 140 mmol/L (ref 134–144)

## 2020-09-14 ENCOUNTER — Other Ambulatory Visit: Payer: Self-pay | Admitting: Family Medicine

## 2020-09-14 DIAGNOSIS — I10 Essential (primary) hypertension: Secondary | ICD-10-CM

## 2020-09-14 DIAGNOSIS — E785 Hyperlipidemia, unspecified: Secondary | ICD-10-CM

## 2020-09-15 MED ORDER — ROSUVASTATIN CALCIUM 10 MG PO TABS: 10.0000 mg | ORAL_TABLET | Freq: Every day | ORAL | 3 refills | Status: DC

## 2020-09-19 ENCOUNTER — Other Ambulatory Visit: Payer: Self-pay | Admitting: Family Medicine

## 2020-09-19 ENCOUNTER — Ambulatory Visit (INDEPENDENT_AMBULATORY_CARE_PROVIDER_SITE_OTHER): Payer: Medicare Other | Admitting: Family Medicine

## 2020-09-19 ENCOUNTER — Other Ambulatory Visit: Payer: Self-pay

## 2020-09-19 ENCOUNTER — Encounter: Payer: Self-pay | Admitting: Family Medicine

## 2020-09-19 VITALS — BP 110/62 | HR 80 | Ht 61.0 in | Wt 121.0 lb

## 2020-09-19 DIAGNOSIS — R350 Frequency of micturition: Secondary | ICD-10-CM | POA: Diagnosis not present

## 2020-09-19 DIAGNOSIS — Z794 Long term (current) use of insulin: Secondary | ICD-10-CM | POA: Diagnosis not present

## 2020-09-19 DIAGNOSIS — R29810 Facial weakness: Secondary | ICD-10-CM | POA: Diagnosis not present

## 2020-09-19 DIAGNOSIS — E119 Type 2 diabetes mellitus without complications: Secondary | ICD-10-CM | POA: Diagnosis not present

## 2020-09-19 DIAGNOSIS — R42 Dizziness and giddiness: Secondary | ICD-10-CM | POA: Insufficient documentation

## 2020-09-19 LAB — POCT URINALYSIS DIP (MANUAL ENTRY)
Bilirubin, UA: NEGATIVE
Blood, UA: NEGATIVE
Glucose, UA: 500 mg/dL — AB
Ketones, POC UA: NEGATIVE mg/dL
Leukocytes, UA: NEGATIVE
Nitrite, UA: NEGATIVE
Protein Ur, POC: NEGATIVE mg/dL
Spec Grav, UA: 1.01 (ref 1.010–1.025)
Urobilinogen, UA: 0.2 E.U./dL
pH, UA: 6.5 (ref 5.0–8.0)

## 2020-09-19 LAB — POCT GLYCOSYLATED HEMOGLOBIN (HGB A1C): Hemoglobin A1C: 6.9 % — AB (ref 4.0–5.6)

## 2020-09-19 MED ORDER — MECLIZINE HCL 25 MG PO TABS: 25.0000 mg | ORAL_TABLET | Freq: Three times a day (TID) | ORAL | 0 refills | Status: DC | PRN

## 2020-09-19 NOTE — Assessment & Plan Note (Addendum)
Acute onset of nausea, dizziness, and worsening right sided facial droop. In setting of prior stroke with positive nystagmus on HINTs exam, concern for intracranial process (brainstem or cerebellar). Other differential includes vestibular neuronitis vs epileptic vertigo/dizziness although less likely. Not consistent with BPPV, Meniere disease, otosclerosis, cervicogenic dizziness, vestibular migraine, orthostatic, cardiac, traumatic. Discussed management options including STAT imaging vs ED visit. Patient and daughter opted for outpatient imaging. At this time will obtain STAT CT head without contrast given history of hemorrhagic stroke then follow up with MRI without contrast to further evaluate cause. Will also start Meclizine 25mg  TID PRN for dizziness/nausea and refer to vestibular rehab. Instructed patient to or to ED if worsening symptoms.  Daughter and patient voiced understanding treatment plan. - Continue ASA and risk stratifying medications

## 2020-09-19 NOTE — Assessment & Plan Note (Addendum)
Unclear etiology. Currently on Jardiance which may be contributing. UA with glucosuria (which is expected in setting of SGLT-2) otherwise negative. Hgb A1C improved from 7.6 to 6.9 with AM CBG of 103 thus unlikely hyperglycemia/DKA as cause for her symptoms. No other infectious symptoms.  Provided reassurance and will follow up urine culture.  Lab closed at time of exam. Can consider obtaining BMP (evalaute electrolytes, BUN, Cr), 24-hour urine, Urine Na and urine osmolality pending above imaging and work up

## 2020-09-19 NOTE — Patient Instructions (Addendum)
It was a pleasure to see you today!  Estoy solicitando una tomografa computarizada y [place]. Alguien le llamar cuando est programado. Pueden ser [employer]. Vaya inmediatamente al departamento de emergencias si sus sntomas empeoran.   Tambin le he recetado Horticulturist, commercial. Puede tomar este medicamento hasta tres veces al da para los [place].   Programe una visita de seguimiento para sus vacunas.  I am ordering a CT head and MRI. Someone will call you when this is scheduled. It could be a few days. Please go immediately to the emergency department if her symtoms worsen.  I have also prescribed her a medicine called Meclizine. She can take this medicine up to 4 times a day for dizziness.   Please schedule follow up visit for her vaccinations.   Best Wishes,   Mina Marble, DO

## 2020-09-19 NOTE — Progress Notes (Signed)
Subjective:   Patient ID: Amy [name]    DOB: [birth year], 64 y.o. female   MRN: [id number]  Aliah Eriksson is a 64 y.o. female with a history of essential hypertension, type 2 diabetes, hemorrhagic stroke in 2015, seizure, anxiety, aphasia, lipidemia, language barrier, paralysis of right side here for dizziness, nausea, increased urinary frequency.  Nausea, Dizziness: Patient and daughter provide history with Spanish interpreter.  Daughter notes that patient has been endorsing dizziness and nausea for 1 day.  She notes that it happens while sitting and while standing.  She describes it as "the room spinning".  She notes this happens with movement and without movement.  Denies any sick contacts, chest pain, arrhythmias, shortness of breath, hearing loss, tinnitus, abdominal pain, vomiting, constipation, diarrhea, head trauma, headaches.  Daughter notes that her right-sided droopiness seems to be worsened.  Patient has chronic aphasia and is nonverbal thus unsure if that is worsened.  Patient denies any work worsening right-sided weakness.  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  Increased Urinary Frequency: Daughter also endorses increased urinary frequency.  Denies any dysuria, back pain, fevers, chills, urgency.  Her CBG this morning is 103.  Denies any vaginal discharge.  Aching Jardiance as prescribed.  Review of Systems:  Per HPI.   Objective:   BP 110/62   Pulse 80   Ht 5\' 1"  (1.549 m)   Wt 121 lb (54.9 kg)   SpO2 98%   BMI 22.86 kg/m  Vitals and nursing note reviewed.  General: pleasant older female, sitting comfortably in exam chair, well nourished, well developed, in no acute distress with non-toxic appearance HEENT: normocephalic, atraumatic, moist mucous membranes Neck: supple, normal ROM CV: regular rate and rhythm without murmurs, rubs, or  gallops Lungs: clear to auscultation bilaterally with normal work of breathing on room air Abdomen: soft, non-tender, non-distended Skin: warm, dry Extremities: warm and well perfused MSK: Right sided weakness of right arm and leg Neuro: nonverbal (able to nod yes or no), no difficulty hearing, PERRL, EOMI with some lateral nystagmus, right sided facial droop, Negative cover/unconver test, positive nystagmus with head impulse test, some instability and dizziness with romberg, able to stand up from chair without difficulty, nonverbal but follows all commands  Assessment & Plan:   Dizziness Acute onset of nausea, dizziness, and worsening right sided facial droop. In setting of prior stroke with positive nystagmus on HINTs exam, concern for intracranial process (brainstem or cerebellar). Other differential includes vestibular neuronitis vs epileptic vertigo/dizziness although less likely. Not consistent with BPPV, Meniere disease, otosclerosis, cervicogenic dizziness, vestibular migraine, orthostatic, cardiac, traumatic. Discussed management options including STAT imaging vs ED visit. Patient and daughter opted for outpatient imaging. At this time will obtain STAT CT head without contrast given history of hemorrhagic stroke then follow up with MRI without contrast to further evaluate cause. Will also start Meclizine 25mg  TID PRN for dizziness/nausea and refer to vestibular rehab. Instructed patient to or to ED if worsening symptoms.  Daughter and patient voiced understanding treatment plan. - Continue ASA and risk stratifying medications  Urinary frequency Unclear etiology. Currently on Jardiance which may be contributing. UA with glucosuria (which is expected in setting of SGLT-2) otherwise negative. Hgb A1C improved from 7.6 to 6.9 with AM CBG of 103 thus unlikely hyperglycemia/DKA as cause for her symptoms. No other infectious symptoms.  Provided reassurance and will follow  up urine culture.  Lab  closed at time of exam. Can consider obtaining BMP (evalaute electrolytes, BUN, Cr), 24-hour urine, Urine Na and urine osmolality pending above imaging and work up  Orders Placed This Encounter  Procedures  . Urine Culture  . CT Head Wo Contrast    Standing Status:   Future    Standing Expiration Date:   09/19/2021    Order Specific Question:   Preferred imaging location?    Answer:   [facility]  . MR Brain Wo Contrast    Standing Status:   Future    Standing Expiration Date:   09/19/2021    Order Specific Question:   What is the patient's sedation requirement?    Answer:   No Sedation    Order Specific Question:   Does the patient have a pacemaker or implanted devices?    Answer:   No    Order Specific Question:   Preferred imaging location?    Answer:   [facility] (table limit-550lbs)  . PT vestibular rehab    Nausea and dizziness Evaluate and treat    Standing Status:   Future    Standing Expiration Date:   09/19/2021  . POCT glycosylated hemoglobin (Hb A1C)  . POCT urinalysis dipstick   Meds ordered this encounter  Medications  . meclizine (ANTIVERT) 25 MG tablet    Sig: Take 1 tablet (25 mg total) by mouth 3 (three) times daily as needed for dizziness or nausea.    Dispense:  30 tablet    Refill:  0    Mina Marble, DO PGY-3, [facility] 09/19/2020 2:16 PM

## 2020-09-21 ENCOUNTER — Ambulatory Visit: Payer: Medicaid Other | Admitting: Family Medicine

## 2020-09-21 LAB — URINE CULTURE

## 2020-09-26 ENCOUNTER — Other Ambulatory Visit: Payer: Medicare Other

## 2020-09-27 ENCOUNTER — Ambulatory Visit
Admission: RE | Admit: 2020-09-27 | Discharge: 2020-09-27 | Disposition: A | Discharge status: Home / Self Care | Payer: Medicare Other | Source: Ambulatory Visit | Attending: Family Medicine | Admitting: Family Medicine

## 2020-09-27 ENCOUNTER — Other Ambulatory Visit: Payer: Self-pay

## 2020-09-27 DIAGNOSIS — R42 Dizziness and giddiness: Secondary | ICD-10-CM

## 2020-09-27 DIAGNOSIS — R29810 Facial weakness: Secondary | ICD-10-CM

## 2020-09-29 ENCOUNTER — Telehealth: Payer: Self-pay

## 2020-09-29 NOTE — Telephone Encounter (Signed)
Patient's son calls nurse line requesting to speak with provider regarding MRI results and next steps.   Forwarding to ordering provider.   Talbot Grumbling, RN

## 2020-09-30 ENCOUNTER — Other Ambulatory Visit: Payer: Self-pay | Admitting: Family Medicine

## 2020-09-30 NOTE — Telephone Encounter (Signed)
Spoke to son and discussed MRI results in more detail and answered all questions appropriately.  Recommended follow up if no improvement or worsening symptoms. He voiced understanding and agreement with plan

## 2020-10-06 ENCOUNTER — Other Ambulatory Visit: Payer: Medicare Other

## 2020-10-09 ENCOUNTER — Other Ambulatory Visit: Payer: Self-pay | Admitting: Family Medicine

## 2020-10-09 DIAGNOSIS — R569 Unspecified convulsions: Secondary | ICD-10-CM

## 2021-04-25 ENCOUNTER — Encounter: Payer: Self-pay | Admitting: Family Medicine

## 2021-04-25 ENCOUNTER — Other Ambulatory Visit: Payer: Self-pay

## 2021-04-25 ENCOUNTER — Ambulatory Visit (INDEPENDENT_AMBULATORY_CARE_PROVIDER_SITE_OTHER): Payer: Medicare Other | Admitting: Family Medicine

## 2021-04-25 VITALS — BP 110/60 | HR 88 | Ht 61.26 in | Wt 123.4 lb

## 2021-04-25 DIAGNOSIS — Z794 Long term (current) use of insulin: Secondary | ICD-10-CM

## 2021-04-25 DIAGNOSIS — R42 Dizziness and giddiness: Secondary | ICD-10-CM

## 2021-04-25 DIAGNOSIS — E785 Hyperlipidemia, unspecified: Secondary | ICD-10-CM | POA: Diagnosis not present

## 2021-04-25 DIAGNOSIS — I10 Essential (primary) hypertension: Secondary | ICD-10-CM

## 2021-04-25 DIAGNOSIS — E119 Type 2 diabetes mellitus without complications: Secondary | ICD-10-CM | POA: Diagnosis not present

## 2021-04-25 DIAGNOSIS — I69320 Aphasia following cerebral infarction: Secondary | ICD-10-CM | POA: Diagnosis not present

## 2021-04-25 DIAGNOSIS — R200 Anesthesia of skin: Secondary | ICD-10-CM

## 2021-04-25 LAB — POCT GLYCOSYLATED HEMOGLOBIN (HGB A1C): HbA1c, POC (controlled diabetic range): 6.7 % (ref 0.0–7.0)

## 2021-04-25 MED ORDER — ROSUVASTATIN CALCIUM 10 MG PO TABS: 10.0000 mg | ORAL_TABLET | Freq: Every day | ORAL | 3 refills | Status: DC

## 2021-04-25 MED ORDER — EMPAGLIFLOZIN 10 MG PO TABS: 10.0000 mg | ORAL_TABLET | Freq: Every day | ORAL | 3 refills | Status: DC

## 2021-04-25 MED ORDER — LISINOPRIL 5 MG PO TABS: 5.0000 mg | ORAL_TABLET | Freq: Every day | ORAL | 0 refills | Status: DC

## 2021-04-25 NOTE — Assessment & Plan Note (Signed)
Dizziness, exam and history not consistent with BPPV, hx of increased dizziness with meclizine. Neuro exam reassuringly unchanged from prior exams, though some gait instability when ambulating in the room. Did consider recurrence of a stroke but it would be out of the window for acute treatment and reassured that there has not been progression of symptoms since then. Orthostatic vitals were negative. Likely contributing to patient's gait instability and would benefit from physical therapy. - Amb referral to PT placed today

## 2021-04-25 NOTE — Assessment & Plan Note (Addendum)
Well-controlled, A1 today 6.7. Near goal with reducing stroke risk. Was on Jardiance 25mg , consideration that this medication can contribute to some volume depletion and that it may be affecting her dizziness. Will decrease the dose to 10mg  and continue Metformin as previously prescribed.  - Decreased Jardiance to 10mg  - Continue Metformin - BMP today

## 2021-04-25 NOTE — Assessment & Plan Note (Addendum)
BP today 110/60. Stable on lisinopril 5mg . Negative orthostatics. Patient apprehensive about decreasing or discontinuing BP medication at this time. Agreeable to considering decreasing the dose to 2.5mg  if dizziness not improving at next visit. - continue lisinopril 5mg  daily. - BMP today

## 2021-04-25 NOTE — Progress Notes (Addendum)
    SUBJECTIVE:   CHIEF COMPLAINT / HPI:   Video interpreter with daughter present as patient is aphasic   Dizziness: Patient with history of dizziness in the past-previously given meclizine but it caused worsening of dizziness; at which time an MRI and CT of the head were completed with no new abnormal findings.  For the last 8 days had noted a different type of dizziness and lightheadedness.  They were checking blood pressures during these episodes and it was not elevated or low.  Dizziness is not provoked by positional changes.  Daughter is concerned about the abnormal gait as patient seems unbalanced and they are concerned about a possibility of having a fall.  Reports that the pain delete that reports that the dizziness is better when laying down and has not tried any medications since the meclizine did not help.  Does note that head turning can sometimes reproduce the symptoms, though it is not consistent. Of note patient does have issues with swallowing from previous stroke and was previously doing speech and physical therapy but has not done them in several years now.  Patient does have baseline right-sided weakness from the prior stroke.  Type 2 DM: Currently on Jardiance 25mg , metformin 1000mg  BID, rosuvastatin 10mg  daily.   PERTINENT  PMH / PSH: Reviewed  OBJECTIVE:   BP 110/60   Pulse 88   Ht 5' 1.26" (1.556 m)   Wt 123 lb 6.4 oz (56 kg)   SpO2 95%   BMI 23.12 kg/m   Gen: well-appearing, NAD CV: RRR, no m/r/g appreciated, no peripheral edema Pulm: CTAB, no wheezes/crackles Neuro: CN II: PERRL CN III, IV,VI: EOMI CV V: Decreased sensation in the right V1, V2, V3 CVII: Asymmetric smile (drooping towards right) and brow raise CN VIII: Normal hearing CN IX,X: Symmetric palate raise  CN XI: 5/5 shoulder shrug CN XII: Symmetric tongue protrusion  RUE 4/5 strength, RUE and BLE strength 5/5. Right hand unable to grip for strength.  Numb sensation in the right UE. Normal  sensation in LE and BLE bilaterally  Unsteady gait, no foot drop noted, can walk straight.   ASSESSMENT/PLAN:   Essential hypertension BP today 110/60. Stable on lisinopril 5mg . Negative orthostatics. Patient apprehensive about decreasing or discontinuing BP medication at this time. Agreeable to considering decreasing the dose to 2.5mg  if dizziness not improving at next visit. - continue lisinopril 5mg  daily. - BMP today  DM type 2 (diabetes mellitus, type 2) Well-controlled, A1 today 6.7. Near goal with reducing stroke risk. Was on Jardiance 25mg , consideration that this medication can contribute to some volume depletion and that it may be affecting her dizziness. Will decrease the dose to 10mg  and continue Metformin as previously prescribed.  - Decreased Jardiance to 10mg  - Continue Metformin - BMP today  Dizziness Dizziness, exam and history not consistent with BPPV, hx of increased dizziness with meclizine. Neuro exam reassuringly unchanged from prior exams, though some gait instability when ambulating in the room. Did consider recurrence of a stroke but it would be out of the window for acute treatment and reassured that there has not been progression of symptoms since then. Orthostatic vitals were negative. Likely contributing to patient's gait instability and would benefit from physical therapy. - Amb referral to PT placed today    Amy [name], [place]

## 2021-04-25 NOTE — Patient Instructions (Addendum)
For your dizziness, I think will be very important to start working with physical therapy.  I think they will be able to help with the steadiness when walking.  Some of the medications that you are currently on can contribute to dizziness including if your blood sugars are going low or if your blood pressure is going well.   We will also decrease the Jardiance back down to the 10 mg to make sure she is not having any issues with dehydration.  If this is not getting better, we may consider decreasing the dose of the lisinopril.  We will also get labs to check your kidney function today and let you know if there are any issues.

## 2021-04-26 ENCOUNTER — Encounter: Payer: Self-pay | Admitting: Family Medicine

## 2021-04-26 LAB — BASIC METABOLIC PANEL
BUN/Creatinine Ratio: 25 (ref 12–28)
BUN: 16 mg/dL (ref 8–27)
CO2: 25 mmol/L (ref 20–29)
Calcium: 9.7 mg/dL (ref 8.7–10.3)
Chloride: 100 mmol/L (ref 96–106)
Creatinine, Ser: 0.64 mg/dL (ref 0.57–1.00)
Glucose: 134 mg/dL — ABNORMAL HIGH (ref 65–99)
Potassium: 4.2 mmol/L (ref 3.5–5.2)
Sodium: 140 mmol/L (ref 134–144)
eGFR: 97 mL/min/{1.73_m2} (ref >59)

## 2021-04-28 ENCOUNTER — Other Ambulatory Visit: Payer: Self-pay

## 2021-04-28 DIAGNOSIS — Z794 Long term (current) use of insulin: Secondary | ICD-10-CM

## 2021-04-28 DIAGNOSIS — R42 Dizziness and giddiness: Secondary | ICD-10-CM

## 2021-04-28 DIAGNOSIS — E119 Type 2 diabetes mellitus without complications: Secondary | ICD-10-CM

## 2021-04-28 MED ORDER — METFORMIN HCL 1000 MG PO TABS: 1000.0000 mg | ORAL_TABLET | Freq: Two times a day (BID) | ORAL | 3 refills | Status: DC

## 2021-04-28 MED ORDER — MECLIZINE HCL 25 MG PO TABS
25.0000 mg | ORAL_TABLET | Freq: Three times a day (TID) | ORAL | 0 refills | Status: DC | PRN
Start: 2021-04-28 — End: 2021-05-15

## 2021-05-14 ENCOUNTER — Other Ambulatory Visit: Payer: Self-pay | Admitting: Family Medicine

## 2021-05-14 DIAGNOSIS — I10 Essential (primary) hypertension: Secondary | ICD-10-CM

## 2021-05-14 DIAGNOSIS — E119 Type 2 diabetes mellitus without complications: Secondary | ICD-10-CM

## 2021-05-15 ENCOUNTER — Other Ambulatory Visit: Payer: Self-pay

## 2021-05-15 DIAGNOSIS — E119 Type 2 diabetes mellitus without complications: Secondary | ICD-10-CM

## 2021-05-15 DIAGNOSIS — Z794 Long term (current) use of insulin: Secondary | ICD-10-CM

## 2021-05-15 DIAGNOSIS — R42 Dizziness and giddiness: Secondary | ICD-10-CM

## 2021-05-16 MED ORDER — MECLIZINE HCL 25 MG PO TABS: 25.0000 mg | ORAL_TABLET | Freq: Three times a day (TID) | ORAL | 0 refills | Status: DC | PRN

## 2021-05-16 MED ORDER — EMPAGLIFLOZIN 10 MG PO TABS: 10.0000 mg | ORAL_TABLET | Freq: Every day | ORAL | 3 refills | Status: DC

## 2021-05-22 NOTE — Progress Notes (Signed)
    SUBJECTIVE:   CHIEF COMPLAINT / HPI:  Amy [name] is a 65yo female with a history of hypertension, T2DM, hemorrhagic stroke (2015) with residual R sided deficit, seizure, anxiety, aphasia, hyperlipidemia presenting today with a complaint of dizziness.  She has been seen for this several times since December 2021.  #Dizziness Seen in our clinic on 7/12 by Dr. Oleh Genin for lightheaded/dizziness.  Dr. Oleh Genin did not feel that symptoms were consistent with vertigo or acute stroke.  Family has tested blood pressures during these episodes and they have been normal.  Referred to physical therapy for help with gait stability.  She has not been attending PT as they never called to make an appointment. Dr. Oleh Genin also decreased her dose of Jardiance to 10 mg daily based on an A1c of 6.7%.   Symptoms have much improved since her last visit and she has no residual symptoms at this time. She feels that the meclizine she has as a PRN med has been most helpful in managing her symptoms.   #Health maintenance She would like her second COVID booster today.  She would also like to have her first Shingrex vaccine.  She is not due for a mammogram until next January, but would like to go ahead and schedule this.  She is not fasting today, but can come back in the morning for a lab visit to have a lipid panel drawn. Last lipid panel was in 2020 and was not fasting.   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  Some residual right sided facial droop,  EOMs intact, gait asymmetric but appears stable, no foot drop on R side.  Strength 4/5 in RUE and RLE, 5/5 on left  Vestibular: Dix Hallpike negative bilaterally     ASSESSMENT/PLAN:   [place] booster administered in clinic - Shingrex prescription sent - Mammogram ordered, patient to call to set up  - Pt to return tomorrow am for fasted lipid panel  Dizziness Symptoms well-controlled with PRN meclizine. Agree with Dr. Marica Otter assessment that symptoms are not consistent with BPPV or orthostasis. Likely sequelae of prior stroke.  - Continue meclizine '25mg'$  PRN - Continue Jardiance at decreased dose of '10mg'$  daily     Amy Dubonnet, MD [place]

## 2021-05-25 ENCOUNTER — Other Ambulatory Visit: Payer: Self-pay

## 2021-05-25 ENCOUNTER — Encounter: Payer: Self-pay | Admitting: Student

## 2021-05-25 ENCOUNTER — Ambulatory Visit (INDEPENDENT_AMBULATORY_CARE_PROVIDER_SITE_OTHER): Payer: Medicare Other | Admitting: Student

## 2021-05-25 ENCOUNTER — Ambulatory Visit (INDEPENDENT_AMBULATORY_CARE_PROVIDER_SITE_OTHER): Payer: Medicare Other

## 2021-05-25 VITALS — BP 126/71 | HR 84 | Ht 61.0 in | Wt 125.6 lb

## 2021-05-25 DIAGNOSIS — E785 Hyperlipidemia, unspecified: Secondary | ICD-10-CM | POA: Diagnosis not present

## 2021-05-25 DIAGNOSIS — R42 Dizziness and giddiness: Secondary | ICD-10-CM | POA: Diagnosis not present

## 2021-05-25 DIAGNOSIS — Z0000 Encounter for general adult medical examination without abnormal findings: Secondary | ICD-10-CM | POA: Diagnosis not present

## 2021-05-25 DIAGNOSIS — Z1231 Encounter for screening mammogram for malignant neoplasm of breast: Secondary | ICD-10-CM | POA: Diagnosis not present

## 2021-05-25 DIAGNOSIS — I619 Nontraumatic intracerebral hemorrhage, unspecified: Secondary | ICD-10-CM

## 2021-05-25 DIAGNOSIS — Z23 Encounter for immunization: Secondary | ICD-10-CM

## 2021-05-25 MED ORDER — SHINGRIX 50 MCG/0.5ML IM SUSR: 0.5000 mL | INTRAMUSCULAR | 0 refills | Status: AC

## 2021-05-25 NOTE — Assessment & Plan Note (Signed)
Symptoms well-controlled with PRN meclizine. Agree with Dr. Marica Otter assessment that symptoms are not consistent with BPPV or orthostasis. Likely sequelae of prior stroke.  - Continue meclizine '25mg'$  PRN - Continue Jardiance at decreased dose of '10mg'$  daily

## 2021-05-25 NOTE — Patient Instructions (Addendum)
Emeterio Reeve, Es una alegra cuidarte! Gracias por venir hoy.   Como recordatorio, aqu hay un resumen de lo que hablamos hoy:  -Es posible que le hagan una mamografa en cualquier momento en los prximos 3 meses.  Llame al [facility] en [place] para hacer esa cita -Por favor, venga a su clnica maana por la maana a las 8:30 AM para Customer service manager extraccin de laboratorio para Chief Technology Officer su colesterol -Estoy muy contento de que tu mareo se haya resuelto.  Hice una referencia a fisioterapia.  Si no lo han llamado en 2 semanas, llame a nuestra oficina. -Estoy enviando una receta para la vacuna contra el herpes zster.  Puede ir a su farmacia para que le hagan esto. -Te estamos dando tu segunda vacuna de refuerzo COVID. Shaune Pollack por cuidar tan bien tu salud!  Mantn la buena exhalacin de Sharmon Leyden recomiendo que siempre traiga sus medicamentos a cada cita, ya que esto hace que sea fcil asegurarse de que estamos tomando los medicamentos correctos y nos ayuda a no perder cuando se Chief Financial Officer.  Tenga cuidado y busque atencin inmediata antes si desarrolla alguna inquietud.   Marnee Guarneri, MD [place]

## 2021-05-25 NOTE — Assessment & Plan Note (Signed)
-   Second COVID booster administered in clinic - Shingrex prescription sent - Mammogram ordered, patient to call to set up  - Pt to return tomorrow am for fasted lipid panel

## 2021-05-26 ENCOUNTER — Other Ambulatory Visit: Payer: Medicare Other

## 2021-05-26 DIAGNOSIS — E785 Hyperlipidemia, unspecified: Secondary | ICD-10-CM

## 2021-05-27 LAB — LIPID PANEL
Chol/HDL Ratio: 2.3 ratio (ref 0.0–4.4)
Cholesterol, Total: 114 mg/dL (ref 100–199)
HDL: 49 mg/dL (ref >39)
LDL Chol Calc (NIH): 46 mg/dL (ref 0–99)
Triglycerides: 105 mg/dL (ref 0–149)
VLDL Cholesterol Cal: 19 mg/dL (ref 5–40)

## 2021-05-28 ENCOUNTER — Encounter: Payer: Self-pay | Admitting: Student

## 2021-06-03 ENCOUNTER — Other Ambulatory Visit: Payer: Self-pay | Admitting: Student

## 2021-06-03 DIAGNOSIS — R42 Dizziness and giddiness: Secondary | ICD-10-CM

## 2021-06-21 ENCOUNTER — Other Ambulatory Visit: Payer: Self-pay

## 2021-06-21 ENCOUNTER — Ambulatory Visit: Payer: Medicare Other | Attending: Family Medicine

## 2021-06-21 DIAGNOSIS — R2689 Other abnormalities of gait and mobility: Secondary | ICD-10-CM | POA: Diagnosis not present

## 2021-06-21 DIAGNOSIS — R4701 Aphasia: Secondary | ICD-10-CM | POA: Diagnosis present

## 2021-06-21 DIAGNOSIS — R2681 Unsteadiness on feet: Secondary | ICD-10-CM | POA: Insufficient documentation

## 2021-06-21 DIAGNOSIS — R482 Apraxia: Secondary | ICD-10-CM | POA: Diagnosis present

## 2021-06-21 DIAGNOSIS — R1310 Dysphagia, unspecified: Secondary | ICD-10-CM | POA: Diagnosis present

## 2021-06-21 DIAGNOSIS — M6281 Muscle weakness (generalized): Secondary | ICD-10-CM | POA: Diagnosis present

## 2021-06-21 DIAGNOSIS — I69351 Hemiplegia and hemiparesis following cerebral infarction affecting right dominant side: Secondary | ICD-10-CM | POA: Diagnosis present

## 2021-06-21 DIAGNOSIS — R278 Other lack of coordination: Secondary | ICD-10-CM | POA: Insufficient documentation

## 2021-06-21 DIAGNOSIS — R41840 Attention and concentration deficit: Secondary | ICD-10-CM | POA: Insufficient documentation

## 2021-06-22 ENCOUNTER — Telehealth: Payer: Self-pay

## 2021-06-22 ENCOUNTER — Other Ambulatory Visit: Payer: Self-pay

## 2021-06-22 DIAGNOSIS — I619 Nontraumatic intracerebral hemorrhage, unspecified: Secondary | ICD-10-CM

## 2021-06-22 DIAGNOSIS — R42 Dizziness and giddiness: Secondary | ICD-10-CM

## 2021-06-22 MED ORDER — MECLIZINE HCL 25 MG PO TABS: ORAL_TABLET | ORAL | 3 refills | Status: DC

## 2021-06-22 NOTE — Telephone Encounter (Signed)
Dr. Owens Shark, Amy [name] was evaluated by PT on 06/21/21.  The patient would benefit from ST and OT evaluations for aphasia from CVA and right UE hemiparesis.   If you agree, please place an order in Amy [name] workque in [facility] or fax the order to [phone]. Thank you, Amy [name], PT, DPT, [place] [address] Amy [name],   Amy [name] Phone:  [phone] Fax:  [phone]

## 2021-06-22 NOTE — Therapy (Signed)
[facility] [address] [place], [place], [address] Phone: [phone]   Fax:  [phone]  Physical Therapy Evaluation  Patient Details  Name: Amy [name] MRN: [id number] Date of Birth: [birth year] Referring Provider (PT): Dorris Singh   Encounter Date: 06/21/2021   PT End of Session - 06/21/21 1541     Visit Number 1    Number of Visits 16    Date for PT Re-Evaluation 08/11/21    Authorization Type medicare so 10th visit progress note    PT Start Time 1536    PT Stop Time 1616    PT Time Calculation (min) 40 min             Past Medical History:  Diagnosis Date   Allergy    Anxiety    Brain injury ([place])    HX OF TRAUMATIC BRAIN INJURY   Constipation    RARE PER PT   Diabetes mellitus    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    Subjective Assessment - 06/21/21 1546     Subjective Pt referred with history of hemorrhagic stroke in 2015. Pt is spanish speaking and interpreter present. Pt has right hemiparesis. She also has difficulty with speech. Reports understands questions but can not speak, read or write. Daughter-in-law assisting with answers. Pt initially had issues with vision but was diagnosed with cataracts but it did not go well. Lost a lot of vision in left eye. Still being treated for that. Pt reports some dizziness when gets up qiuckly that lasts for a while afterwards. She reports that room does spin and feels unsteady on feet. Is not everytime she gets up, only if gets  up quickly. It started about 2 months ago. Was started on meclizine and hoping to get rid of it. It does help. Did a scan that was negative for any acute stroke at the time.    Patient is accompained by: Family member;Interpreter   daughter in Sports coach and interpreter, Cory Roughen   Pertinent History hypertension, T2DM, hemorrhagic stroke (2015) with residual R sided deficit, seizure, anxiety, aphasia, hyperlipidemia. Dizziness    Patient Stated Goals Pt wants to work on speech, arm and leg with improving her balance.    Currently in Pain? No/denies                [facility] PT Assessment - 06/21/21 1557       Assessment   Medical Diagnosis hx of hemorrhagic stroke    Referring Provider (PT) Dorris Singh    Onset Date/Surgical Date 05/25/21    Hand Dominance Right    Prior Therapy therapy after stroke in 2015 in hospital. None since. Daughter-in-law and son helped her to teach her to walk      Precautions   Precautions Fall      Balance Screen   Has the patient fallen in the past 6 months No  Has the patient had a decrease in activity level because of a fear of falling?  No    Is the patient reluctant to leave their home because of a fear of falling?  No      Home Environment   Living Environment Private residence    Living Arrangements Children   daughter in law and son   Available Help at Discharge Family    Type of [place] to enter    Entrance Stairs-Number of Steps 4    [address] One level      Prior Function   Level of Independence Independent   prior to CVA. Pt does for herself in home now   Vocation On disability    Leisure liked to dance and socialize      Cognition   Overall Cognitive Status Impaired/Different from baseline    Area of Impairment --   Pt is aphasic with expressive aphasia. Seems to be able to answer yes/no questions and understands commands. Denies ability to read or write since stroke     Sensation    Light Touch Impaired by gross assessment    Additional Comments impaired light touch right side of body in arm and leg.      ROM / Strength   AROM / PROM / Strength Strength      Strength   Overall Strength Comments --    Strength Assessment Site Shoulder;Elbow;Hand;Knee;Hip;Ankle    Right/Left Shoulder Right;Left    Right Shoulder Flexion 3+/5   with some compensatory movements noted   Left Shoulder Flexion 5/5    Right/Left Elbow Right;Left    Right Elbow Flexion 3/5    Right Elbow Extension 3+/5    Left Elbow Flexion 5/5    Left Elbow Extension 5/5    Right/Left hand Right;Left    Right Hand Gross Grasp Impaired   minimal finger flexion and no finger extension noted   Left Hand Gross Grasp Functional    Right/Left Hip Right;Left    Right Hip Flexion 4/5    Left Hip Flexion 4+/5    Right/Left Knee Right;Left    Right Knee Flexion 4/5    Right Knee Extension 4+/5    Left Knee Flexion 5/5    Left Knee Extension 5/5    Right/Left Ankle Right;Left    Right Ankle Dorsiflexion 4/5    Left Ankle Dorsiflexion 5/5      Transfers   Transfers Sit to Stand;Stand to Sit    Sit to Stand 5: Supervision    Five time sit to stand comments  13.95 sec from chair without hands    Stand to Sit 5: Supervision      Ambulation/Gait   Ambulation/Gait Yes    Ambulation/Gait Assistance 5: Supervision    Ambulation Distance (Feet) 75 Feet    Assistive device None    Gait Pattern Step-through pattern;Decreased stance time - right;Decreased step length - left;Decreased arm swing - right    Ambulation Surface Level;Indoor    Gait velocity 11.52 sec=0.66ms      High Level Balance   High Level Balance Comments SLS ~3 sec each leg                        Objective measurements completed on examination: See above findings.                PT Education - 06/22/21  [id number]     Education Details PT plan of care. Will request OT and ST evals as well.    Person(s) Educated  Patient;Child(ren)    Methods Explanation    Comprehension Verbalized understanding              PT Short Term Goals - 06/22/21 1119       PT SHORT TERM GOAL #1   Title Pt will be able to perform initial HEP for strengthening and balance with supervision of family.    Time 4    Period Weeks    Status New    Target Date 07/20/21      PT SHORT TERM GOAL #2   Title Pt will decrease 5 x sit to stand from 13.95 sec to <11 sec for improved balance and functional strength.    Baseline 06/21/21 13.95 sec from chair without hands    Time 4    Period Weeks    Status New    Target Date 07/20/21      PT SHORT TERM GOAL #3   Title Pt will report minimal to no dizziness with daily activities for improved function.    Time 4    Period Weeks    Status New    Target Date 07/20/21      PT SHORT TERM GOAL #4   Title Pt will ambulate up/down 4 steps mod I in reciprocal pattern without rails for improved access to home.    Time 4    Period Weeks    Status New    Target Date 07/20/21      PT SHORT TERM GOAL #5   Title FGA will be assessed and LTG written.    Time 4    Period Weeks    Status New    Target Date 07/20/21               PT Long Term Goals - 06/22/21 1122       PT LONG TERM GOAL #1   Title Pt will be independent with progressive HEP for strengthening and balance to continue with family at home.    Time 8    Period Weeks    Status New    Target Date 08/11/21      PT LONG TERM GOAL #2   Title Pt will increase SLS to >10 sec each leg for improved balance.    Baseline 06/21/21 3 sec    Time 8    Period Weeks    Status New    Target Date 08/11/21      PT LONG TERM GOAL #3   Title Pt will ambulate >1000' on varied surfaces independently for improved community mobility.    Time 8    Period Weeks    Status New    Target Date 08/11/21      PT LONG TERM GOAL #4   Title FGA goal TBD    Time 8    Period Weeks    Status New    Target Date 08/11/21      PT  LONG TERM GOAL #5   Title Pt will increase gait speed to >1.75ms for improved community safety.    Baseline 06/21/21 0.883m    Time 8    Period Weeks    Status New    Target Date 08/11/21                    Plan - 06/22/21 1109  Clinical Impression Statement Pt is 65 y/o Spanish speaking female with history of hemorrhagic stroke from 2015 with right hemiparesis and aphasia. Pt only had therapy in hospital at time and none when returned home. Learned to walk and start moving with working with her family. Pt has expressive aphasia being unable to speak and denies ability to be able to read or write since stroke. Pts RUE is weaker than RLE.  PT is going to request ST and OT evals as well. Pt is currently walking without AD with gait speed of 0.67ms indicating safety with community mobility but decreased compared to norms. She is fall risk based on 5 x sit to stand of 13.95 sec and unable to hold SLS >3 sec. Pt is also reporting dizziness that started about 2 months ago if gets up quickly that does persist. Denied any during session today and PT will need to further assess next visit due to time constraints. Looks like orthostatics had been ruled out by MD prior. Meclizine had been prescribed and does seem to help. Pt will benefit from skilled PT to address strength, balance and functional mobility deficits.    Personal Factors and Comorbidities Time since onset of injury/illness/exacerbation;Comorbidity 3+    Comorbidities hypertension, T2DM, hemorrhagic stroke (2015) with residual R sided deficit, seizure, anxiety, aphasia, hyperlipidemia. Dizziness    Examination-Activity Limitations Locomotion Level;Transfers;Stairs;Stand;Squat    Examination-Participation Restrictions CArt gallery managerYard Work    SMerchant navy officerEvolving/Moderate complexity    Clinical Decision Making Moderate    Rehab Potential Good    PT Frequency 2x / week    PT Duration 8 weeks     PT Treatment/Interventions ADLs/Self Care Home Management;DME Instruction;Gait training;Stair training;Functional mobility training;Therapeutic activities;Canalith Repostioning;Therapeutic exercise;Balance training;Neuromuscular re-education;Manual techniques;Patient/family education;Vestibular    PT Next Visit Plan Pt will need spanish interpreter. Pt is aphasic but able to follow commands and response to yes/no. Unable to read/write/speak per her report. Next visit further assess dizziness as ran out of time at eval. Only getting when gets up quick but does persist mildly per pt. Assess FGA and add goal. Begin RLE strengthening HEP with focus on hip strength and high level balance. I have requested OT and ST orders. Plan is for 8 weeks but only had schedule for 4 so that when seen by OT and ST can try to add on with them.    Consulted and Agree with Plan of Care Patient;Family member/caregiver    Family Member Consulted daughter-in-law             Patient will benefit from skilled therapeutic intervention in order to improve the following deficits and impairments:  Abnormal gait, Decreased mobility, Decreased strength, Impaired sensation, Dizziness, Decreased balance  Visit Diagnosis: Other abnormalities of gait and mobility  Muscle weakness (generalized)  Hemiplegia and hemiparesis following cerebral infarction affecting right dominant side (HCC)  Unsteadiness on feet     Problem List Patient Active Problem List   Diagnosis Date Noted   Dizziness 09/19/2020   Urinary frequency 09/19/2020   Encounter for counseling regarding advance directives 10/02/2019   Healthcare maintenance 08/05/2019   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 [place], [place], [address] Phone: [phone]   Fax:  [phone]  Name: Amy [name] MRN: [id number] Date of Birth: 1955/12/27

## 2021-06-28 ENCOUNTER — Ambulatory Visit: Payer: Medicare Other

## 2021-06-28 ENCOUNTER — Other Ambulatory Visit: Payer: Self-pay

## 2021-06-28 DIAGNOSIS — R2681 Unsteadiness on feet: Secondary | ICD-10-CM

## 2021-06-28 DIAGNOSIS — R2689 Other abnormalities of gait and mobility: Secondary | ICD-10-CM | POA: Diagnosis not present

## 2021-06-28 DIAGNOSIS — M6281 Muscle weakness (generalized): Secondary | ICD-10-CM

## 2021-06-28 NOTE — Therapy (Signed)
[facility] [address] [place], [place], [address] Phone: [phone]   Fax:  [phone]  Physical Therapy Treatment  Patient Details  Name: Amy [name] MRN: [id number] Date of Birth: 11-12-1955 Referring Provider (PT): Dorris Singh   Encounter Date: 06/28/2021   PT End of Session - 06/28/21 1621     Visit Number 2    Number of Visits 16    Date for PT Re-Evaluation 08/11/21    Authorization Type medicare so 10th visit progress note    PT Start Time 1620    PT Stop Time 1702    PT Time Calculation (min) 42 min             Past Medical History:  Diagnosis Date   Allergy    Anxiety    Brain injury ([place])    HX OF TRAUMATIC BRAIN INJURY   Constipation    RARE PER PT   Diabetes mellitus    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   Subjective Assessment - 06/28/21 1622     Subjective Pt reports that she does get dizzy when gets up quickly or is walking.    Patient is accompained by: Family member;Interpreter   daughter in Sports coach and interpreter, Alis   Pertinent History hypertension, T2DM, hemorrhagic stroke (2015) with residual R sided deficit, seizure, anxiety, aphasia, hyperlipidemia. Dizziness    Patient Stated Goals Pt wants to work on speech, arm and leg with improving her balance.    Currently in Pain? No/denies                [facility] PT Assessment - 06/28/21 1623       Functional Gait  Assessment   Gait assessed  Yes    Gait Level Surface Walks 20 ft, slow speed,  abnormal gait pattern, evidence for imbalance or deviates 10-15 in outside of the 12 in walkway width. Requires more than 7 sec to ambulate 20 ft.    Change in Gait Speed Able to change speed, demonstrates mild gait deviations, deviates 6-10 in outside of the 12 in walkway width, or no gait deviations, unable to achieve a major change in velocity, or uses a change in velocity, or uses an assistive device.    Gait with Horizontal Head Turns Performs head turns smoothly with slight change in gait velocity (eg, minor disruption to smooth gait path), deviates 6-10 in outside 12 in walkway width, or uses an assistive device.    Gait with Vertical Head Turns Performs head turns with no change in gait. Deviates no more than 6 in outside 12 in walkway width.    Gait and Pivot Turn Pivot turns safely within 3 sec and stops quickly with no loss of balance.    Step Over Obstacle Is able to step over 2 stacked shoe boxes taped together (9 in total height) without changing gait speed. No evidence of imbalance.  Gait with Narrow Base of Support Ambulates 4-7 steps.    Gait with Eyes Closed Walks 20 ft, slow speed, abnormal gait pattern, evidence for imbalance, deviates 10-15 in outside 12 in walkway width. Requires more than 9 sec to ambulate 20 ft.    Ambulating Backwards Walks 20 ft, uses assistive device, slower speed, mild gait deviations, deviates 6-10 in outside 12 in walkway width.    Steps Alternating feet, no rail.    Total Score 21                           [facility] Adult PT Treatment/Exercise - 06/28/21 1623       Ambulation/Gait   Ambulation/Gait Yes    Ambulation/Gait Assistance 5: Supervision    Ambulation/Gait Assistance Details around in clinic during session    Assistive device None    Gait Pattern Step-through pattern;Decreased arm swing - right;Decreased step length - left;Decreased stance time - right    Ambulation Surface Level;Indoor      Neuro Re-ed    Neuro Re-ed  Details  Marching gait 20' x 4 with verbal cues to go slow and controlled, tandem walking 20' x 2, gait with horizontal head turns 20' x 2 with verbal cues to focus on target each time she turns head. Pt reported only minimal dizziness when she focused on targets this time compared to not when doing FGA. In // bars: step-ups on rockerboard positioned ant/post x 10 RLE then x 10 with tapping cone with LLE after stepping up without UE support to increase right SLS time close SBA/CGA.                     PT Education - 06/28/21 2116     Education Details Initial HEP    Person(s) Educated Patient;Child(ren)    Methods Explanation;Demonstration;Handout    Comprehension Verbalized understanding;Returned demonstration              PT Short Term Goals - 06/22/21 1119       PT SHORT TERM GOAL #1   Title Pt will be able to perform initial HEP for strengthening and balance with supervision of family.    Time 4    Period Weeks    Status New    Target Date 07/20/21      PT SHORT TERM GOAL #2   Title Pt will decrease 5 x sit to stand from 13.95 sec to <11 sec for improved balance and functional strength.    Baseline 06/21/21 13.95 sec from chair without hands    Time 4    Period Weeks    Status New    Target Date 07/20/21      PT SHORT TERM GOAL #3   Title Pt will report minimal to no dizziness with daily activities for improved function.    Time 4    Period Weeks    Status New    Target Date 07/20/21      PT SHORT TERM GOAL #4   Title Pt will ambulate up/down 4 steps mod I in reciprocal pattern without rails for improved access to home.    Time 4    Period Weeks    Status New    Target Date 07/20/21      PT SHORT TERM GOAL #5   Title FGA will be assessed and LTG written.    Time 4    Period Weeks    Status New  Target Date 07/20/21               PT Long Term Goals - 06/22/21 1122       PT LONG TERM GOAL #1   Title Pt will be independent with  progressive HEP for strengthening and balance to continue with family at home.    Time 8    Period Weeks    Status New    Target Date 08/11/21      PT LONG TERM GOAL #2   Title Pt will increase SLS to >10 sec each leg for improved balance.    Baseline 06/21/21 3 sec    Time 8    Period Weeks    Status New    Target Date 08/11/21      PT LONG TERM GOAL #3   Title Pt will ambulate >1000' on varied surfaces independently for improved community mobility.    Time 8    Period Weeks    Status New    Target Date 08/11/21      PT LONG TERM GOAL #4   Title FGA goal TBD    Time 8    Period Weeks    Status New    Target Date 08/11/21      PT LONG TERM GOAL #5   Title Pt will increase gait speed to >1.27ms for improved community safety.    Baseline 06/21/21 0.84m    Time 8    Period Weeks    Status New    Target Date 08/11/21                   Plan - 06/28/21 2116     Clinical Impression Statement PT assessed orthostatics today. Pt had no issues with BP. Did report some dizziness with going from supine to sit that lasted <1 min. Denied feeling light headed. Also noted some dizziness with gait with horizontal head turns during FGA that did not last long. When cued to focus eyes on target when turning head dizziness decreased. Pt is fall risk based on FGA of 21/30.    Personal Factors and Comorbidities Time since onset of injury/illness/exacerbation;Comorbidity 3+    Comorbidities hypertension, T2DM, hemorrhagic stroke (2015) with residual R sided deficit, seizure, anxiety, aphasia, hyperlipidemia. Dizziness    Examination-Activity Limitations Locomotion Level;Transfers;Stairs;Stand;Squat    Examination-Participation Restrictions CoArt gallery managerard Work    StMerchant navy officervolving/Moderate complexity    Rehab Potential Good    PT Frequency 2x / week    PT Duration 8 weeks    PT Treatment/Interventions ADLs/Self Care Home Management;DME  Instruction;Gait training;Stair training;Functional mobility training;Therapeutic activities;Canalith Repostioning;Therapeutic exercise;Balance training;Neuromuscular re-education;Manual techniques;Patient/family education;Vestibular    PT Next Visit Plan Pt will need spanish interpreter. Pt is aphasic but able to follow commands and response to yes/no. Unable to read/write/speak per her report. Next visit further assess dizziness. I checked orthostatics and no issues. Did report dizziness with supine to sit and with gait with horizontal head turns today that improved with focusing on targets some.  Continue RLE strengthening HEP with focus on hip strength and high level balance.  Plan is for 8 weeks but only had schedule for 4 so that when seen by OT and ST can try to add on with them.    Consulted and Agree with Plan of Care Patient;Family member/caregiver    Family Member Consulted daughter-in-law             Patient will benefit from skilled therapeutic intervention in  order to improve the following deficits and impairments:  Abnormal gait, Decreased mobility, Decreased strength, Impaired sensation, Dizziness, Decreased balance  Visit Diagnosis: Other abnormalities of gait and mobility  Unsteadiness on feet  Muscle weakness (generalized)     Problem List Patient Active Problem List   Diagnosis Date Noted   Dizziness 09/19/2020   Urinary frequency 09/19/2020   Encounter for counseling regarding advance directives 10/02/2019   Healthcare maintenance 08/05/2019   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) ([place]) 09/19/2015   Right facial numbness 02/09/2015   Anxiety state 10/28/2014   Language barrier to communication 10/28/2014   Cephalalgia    Change in vision 03/02/2014   Seizures ([place]) 02/24/2014   Hemorrhagic stroke ([place]) 02/16/2014   Essential hypertension 02/16/2014   DM type 2 (diabetes mellitus, type 2) ([place]) 02/16/2014   Aphasia as late effect of  stroke 02/16/2014   Paralysis ([place]) 02/16/2014   Hyperlipidemia 02/16/2014    Electa Sniff, PT, DPT,NCS 06/28/2021, 9:21 PM  [facility] [address] [place], [place], [address] Phone: [phone]   Fax:  [phone]  Name: Amy [name] MRN: [id number] Date of Birth: August 24, 1956

## 2021-06-28 NOTE — Patient Instructions (Signed)
Access Code: [email or web address] URL: [email or web address] Date: 06/28/2021 Prepared by: Cherly Anderson  Exercises Walking March - 1 x daily - 7 x weekly - 3 sets - 10 reps Tandem Walking - 1 x daily - 7 x weekly - 3 sets - 10 reps Walking with Head Rotation - 1 x daily - 7 x weekly - 3 sets - 10 reps

## 2021-06-30 ENCOUNTER — Ambulatory Visit: Payer: Medicare Other

## 2021-06-30 ENCOUNTER — Other Ambulatory Visit: Payer: Self-pay

## 2021-06-30 VITALS — BP 138/82 | HR 80

## 2021-06-30 DIAGNOSIS — R2689 Other abnormalities of gait and mobility: Secondary | ICD-10-CM

## 2021-06-30 DIAGNOSIS — R2681 Unsteadiness on feet: Secondary | ICD-10-CM

## 2021-06-30 NOTE — Therapy (Signed)
[facility] [address] [place], [place], [address] Phone: [phone]   Fax:  [phone]  Physical Therapy Treatment  Patient Details  Name: Amy [name] MRN: [id number] Date of Birth: January 17, 1956 Referring Provider (PT): Dorris Singh   Encounter Date: 06/30/2021   PT End of Session - 06/30/21 1116     Visit Number 3    Number of Visits 16    Date for PT Re-Evaluation 08/11/21    Authorization Type medicare so 10th visit progress note    PT Start Time 0932    PT Stop Time 1015    PT Time Calculation (min) 43 min    Activity Tolerance Patient tolerated treatment well    Behavior During Therapy WFL for tasks assessed/performed             Past Medical History:  Diagnosis Date   Allergy    Anxiety    Brain injury ([place])    HX OF TRAUMATIC BRAIN INJURY   Constipation    RARE PER PT   Diabetes mellitus    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 Patient reports that the dizziness has been good, has not had any. Daughter reports she has had some dizziness.    Patient is accompained by: Family member;Interpreter   daughter in Sports coach and interpreter, [place]   Pertinent History hypertension, T2DM, hemorrhagic stroke (2015) with residual R sided deficit, seizure, anxiety, aphasia, hyperlipidemia. Dizziness    Patient Stated Goals Pt wants to work on speech, arm and leg with improving  her balance.    Currently in Pain? No/denies                Vestibular Assessment - 06/30/21 0001       Symptom Behavior   Subjective history of current problem Daughter reports dizziness with activity,seated and occurs spontaneously requiring patient to lie down, supine > sit    Type of Dizziness  Spinning    Frequency of Dizziness sometimes daily    Duration of Dizziness < 1 minute    Symptom Nature Motion provoked    Aggravating Factors Supine to sit;Turning head quickly    Relieving Factors Lying supine      Oculomotor Exam   Oculomotor Alignment Normal    Ocular ROM WNL    Spontaneous Absent    Smooth Pursuits Saccades    Saccades Slow      Vestibulo-Ocular Reflex   VOR 1 Head Only (x 1 viewing) unable to maintain focus with VOR    Comment completed with "x" card, mild dizziness and spinning sensation reported      Visual Acuity   Static unable to assess due to aphasia      Positional Testing   Sidelying Test Sidelying Right;Sidelying Left  Sidelying Right   Sidelying Right Duration 0    Sidelying Right Symptoms No nystagmus      Sidelying Left   Sidelying Left Duration 0    Sidelying Left Symptoms No nystagmus   reports dizziness with return to upright, no nystagmus     Positional Sensitivities   Sit to Supine No dizziness    Supine to Left Side Mild dizziness   with return to upright   Supine to Right Side No dizziness    Supine to Sitting Mild dizziness    Nose to Right Knee No dizziness    Right Knee to Sitting Lightedness    Nose to Left Knee No dizziness    Left Knee to Sitting Mild dizziness    Head Turning x 5 Mild dizziness    Head Nodding x 5 Mild dizziness    Rolling Right No dizziness    Rolling Left No dizziness    Positional Sensitivities Comments quick resolution of symptoms.              [facility] Adult PT Treatment/Exercise - 06/30/21 0001       Ambulation/Gait   Ambulation/Gait Yes    Ambulation/Gait Assistance 5:  Supervision    Ambulation/Gait Assistance Details ambulation into/out of therapy session    Assistive device None    Gait Pattern Step-through pattern;Decreased arm swing - right;Decreased step length - left;Decreased stance time - right    Ambulation Surface Level;Indoor               PT Education - 06/30/21 1116     Education Details Vestibular Findings, VOR impairment    Person(s) Educated Patient;Child(ren)    Methods Explanation    Comprehension Verbalized understanding              PT Short Term Goals - 06/22/21 1119       PT SHORT TERM GOAL #1   Title Pt will be able to perform initial HEP for strengthening and balance with supervision of family.    Time 4    Period Weeks    Status New    Target Date 07/20/21      PT SHORT TERM GOAL #2   Title Pt will decrease 5 x sit to stand from 13.95 sec to <11 sec for improved balance and functional strength.    Baseline 06/21/21 13.95 sec from chair without hands    Time 4    Period Weeks    Status New    Target Date 07/20/21      PT SHORT TERM GOAL #3   Title Pt will report minimal to no dizziness with daily activities for improved function.    Time 4    Period Weeks    Status New    Target Date 07/20/21      PT SHORT TERM GOAL #4   Title Pt will ambulate up/down 4 steps mod I in reciprocal pattern without rails for improved access to home.    Time 4    Period Weeks    Status New    Target Date 07/20/21      PT SHORT TERM GOAL #5   Title FGA will be assessed and LTG written.    Time 4    Period Weeks    Status New    Target Date 07/20/21               PT Long Term Goals - 06/22/21 1122       PT LONG  TERM GOAL #1   Title Pt will be independent with progressive HEP for strengthening and balance to continue with family at home.    Time 8    Period Weeks    Status New    Target Date 08/11/21      PT LONG TERM GOAL #2   Title Pt will increase SLS to >10 sec each leg for improved balance.     Baseline 06/21/21 3 sec    Time 8    Period Weeks    Status New    Target Date 08/11/21      PT LONG TERM GOAL #3   Title Pt will ambulate >1000' on varied surfaces independently for improved community mobility.    Time 8    Period Weeks    Status New    Target Date 08/11/21      PT LONG TERM GOAL #4   Title FGA goal TBD    Time 8    Period Weeks    Status New    Target Date 08/11/21      PT LONG TERM GOAL #5   Title Pt will increase gait speed to >1.27ms for improved community safety.    Baseline 06/21/21 0.869m    Time 8    Period Weeks    Status New    Target Date 08/11/21                   Plan - 06/30/21 1119     Clinical Impression Statement With further vestibular testing, patient demonstrating increased motion senstivity with MSQ most noted with movements returnign to upright. More symptoms noted on L side. Patient demo abnormal oculomotor exam with saccades with smooth pursuits, increased symptoms with VOR x 1. Vestibular assesment limited due to aphasia, unable to complete DVA. PT providing extensive education to daughter regarding motion sensitivity, and potential central cause due to CVA and TBI. Patient will beneift from habituation and VOR activites.    Personal Factors and Comorbidities Time since onset of injury/illness/exacerbation;Comorbidity 3+    Comorbidities hypertension, T2DM, hemorrhagic stroke (2015) with residual R sided deficit, seizure, anxiety, aphasia, hyperlipidemia. Dizziness    Examination-Activity Limitations Locomotion Level;Transfers;Stairs;Stand;Squat    Examination-Participation Restrictions CoArt gallery managerard Work    [employer] officervolving/Moderate complexity    Rehab Potential Good    PT Frequency 2x / week    PT Duration 8 weeks    PT Treatment/Interventions ADLs/Self Care Home Management;DME Instruction;Gait training;Stair training;Functional mobility training;Therapeutic activities;Canalith  Repostioning;Therapeutic exercise;Balance training;Neuromuscular re-education;Manual techniques;Patient/family education;Vestibular    PT Next Visit Plan Pt will need spanish interpreter. Pt is aphasic but able to follow commands and response to yes/no. Unable to read/write/speak per her report. Will benefit from VOR and habituation, believe it is more central of origin causing motion senstivity.  Continue RLE strengthening HEP with focus on hip strength and high level balance.  Plan is for 8 weeks but only had schedule for 4 so that when seen by OT and ST can try to add on with them.    Consulted and Agree with Plan of Care Patient;Family member/caregiver    Family Member Consulted daughter-in-law             Patient will benefit from skilled therapeutic intervention in order to improve the following deficits and impairments:  Abnormal gait, Decreased mobility, Decreased strength, Impaired sensation, Dizziness, Decreased balance  Visit Diagnosis: Other abnormalities of gait and mobility  Unsteadiness on feet     Problem List  Patient Active Problem List   Diagnosis Date Noted   Dizziness 09/19/2020   Urinary frequency 09/19/2020   Encounter for counseling regarding advance directives 10/02/2019   Healthcare maintenance 08/05/2019   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 [place], [place],  [address] Phone: [phone]   Fax:  [phone]  Name: Zaily Writt MRN: [id number] Date of Birth: 16-Jun-1956

## 2021-07-05 ENCOUNTER — Ambulatory Visit: Payer: Medicare Other | Admitting: Occupational Therapy

## 2021-07-05 ENCOUNTER — Ambulatory Visit: Payer: Medicare Other

## 2021-07-05 ENCOUNTER — Other Ambulatory Visit: Payer: Self-pay

## 2021-07-05 DIAGNOSIS — M6281 Muscle weakness (generalized): Secondary | ICD-10-CM

## 2021-07-05 DIAGNOSIS — R2689 Other abnormalities of gait and mobility: Secondary | ICD-10-CM

## 2021-07-05 DIAGNOSIS — I69351 Hemiplegia and hemiparesis following cerebral infarction affecting right dominant side: Secondary | ICD-10-CM

## 2021-07-05 DIAGNOSIS — R278 Other lack of coordination: Secondary | ICD-10-CM

## 2021-07-05 DIAGNOSIS — R2681 Unsteadiness on feet: Secondary | ICD-10-CM

## 2021-07-05 DIAGNOSIS — R41840 Attention and concentration deficit: Secondary | ICD-10-CM

## 2021-07-05 NOTE — Patient Instructions (Signed)
Access Code: [email or web address] URL: [email or web address] Date: 07/05/2021 Prepared by: Baldomero Lamy  Exercises Walking March - 1 x daily - 7 x weekly - 3 sets - 10 reps Tandem Walking - 1 x daily - 7 x weekly - 3 sets - 10 reps Walking with Head Rotation - 1 x daily - 7 x weekly - 3 sets - 10 reps Seated Gaze Stabilization with Head Rotation - 1 x daily - 7 x weekly - 1 sets - 2-3 reps - 60 seconds hold Seated Gaze Stabilization with Head Nod - 1 x daily - 7 x weekly - 1 sets - 2-3 reps - 60 seconds hold Brandt-Daroff Vestibular Exercise - 2 x daily - 7 x weekly - 1 sets - 5 reps

## 2021-07-05 NOTE — Therapy (Signed)
[facility] [address] [place], [place], [address] Phone: [phone]   Fax:  [phone]  Occupational Therapy Evaluation  Patient Details  Name: Amy [name] MRN: [id number] Date of Birth: 1956/09/26 Referring Provider (OT): Dr. Owens Shark   Encounter Date: 07/05/2021   OT End of Session - 07/05/21 1115     Visit Number 1    Number of Visits 25    Date for OT Re-Evaluation 09/27/21    Authorization Type Medicare/ Medicaid    Authorization Time Period POC written for 12 weeks may d/c after 8 dependent on progress.    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   Subjective Assessment - 07/05/21 1023     Subjective  Pt s/p CVA 2015, and she only recieved therapy in the hospital    Pertinent History 65yo female with a history of hypertension, T2DM, hemorrhagic stroke (2015) with residual R sided deficit, seizure, anxiety, aphasia, hyperlipidemia, hx of TBI    Patient Stated Goals be more independent    Currently in Pain? No/denies               [facility] OT Assessment - 07/05/21 1026       Assessment   Medical Diagnosis hx of  hemorrhagic CVA   CVA 2015   Referring Provider (OT) Dr. Owens Shark    Onset Date/Surgical Date 05/25/21    Hand Dominance Right    Prior Therapy therapy after stroke in 2015 in hospital. None since. Daughter-in-law and son helped her to teach her to walk      Precautions   Precautions Fall      Balance Screen   Has the patient fallen in the past 6 months No    Has the patient had a decrease in activity level because of a fear of falling?  No    Is the patient reluctant to leave their home because of a fear of falling?  No      Home  Environment   Family/patient expects to be discharged to: Private residence    [place] One level    Bathroom Building control surveyor    Lives With Family      Prior Function   Level of Independence Independent    Vocation On disability  ADL   Eating/Feeding Modified independent    Grooming Modified independent    Upper Body Bathing Moderate assistance    Lower Body Bathing Moderate assistance    Upper Body Dressing Minimal assistance   needs assist with fastening bra, donns shirt set up   Lower Body Dressing Supervision/safety;Set up    Toilet Transfer Modified independent    Tub/Shower Transfer Min guard      IADL   Light Housekeeping Needs help with all home maintenance tasks    Meal Prep Needs to have meals prepared and served;Able to complete simple cold meal and snack prep   uses microwave with supervision/ min A     Mobility   Mobility Status --   supervision     Written Expression   Dominant Hand Right      Vision Assessment   Vision Assessment Vision not tested      Cognition   Area of Impairment Awareness;Safety/judgement    Cognition Comments Pt is aphasic so cognition was not formally assessed      Sensation   Light Touch Not tested   possible sensory deficits RUE, decreased awareness     Coordination   Gross Motor Movements are Fluid and Coordinated No    Fine Motor Movements are Fluid and  Coordinated No    Box and Blocks Unable to perform for R, able to grasp block if placed in right hand     Perception   Inattention/Neglect Does not attend to right side of body      ROM / Strength   AROM / PROM / Strength AROM      AROM   Overall AROM  Deficits    Overall AROM Comments R hemiplegia shoulder flexion 115, full elbow  flexion/ extnesion, trace supination and trace wrist extension. Trace finger flexion with facilitation LUE appears WFLs     Hand Function   Right Hand Gross Grasp Impaired   Pt was able to grasp a 1 inch block if placed in her hand                               OT Short Term Goals - 07/05/21 1133       OT SHORT TERM GOAL #1   Title I with HEP    Time 4    Period Weeks    Status New    Target Date 08/02/21      OT SHORT TERM GOAL #2   Title Pt will perform bathing with supervision    Time 4    Period Weeks    Status New      OT SHORT TERM GOAL #3   Title Pt will use RUE as a stabilizer/ gross A at least 25% of the time.    Time 4    Period Weeks    Status New      OT SHORT TERM GOAL #4   Title Pt will demonstrate ability to grasp/ relase a 2 inch block 2/5 trials.    Time 4    Period Weeks    Status New      OT SHORT TERM GOAL #5   Title Pt will consistently perform light home management with no more than min A.    Time 4    Period Weeks    Status New               OT Long Term Goals -  07/05/21 1136       OT LONG TERM GOAL #1   Title I with updated HEP.    Time 12    Period Weeks    Status New    Target Date 09/27/21      OT LONG TERM GOAL #2   Title Pt will perform all basic ADLS modified independently    Time 12    Period Weeks    Status New      OT LONG TERM GOAL #3   Title Pt will perform basic home management modified independently    Time 12    Period Weeks    Status New      OT LONG TERM GOAL #4   Title Pt will grasp/ release a 1 inch block 4/5 trials with RUE    Time 12     Period Weeks    Status New      OT LONG TERM GOAL #5   Title Pt will use RUE as a stabilizer/ gross A at least 40% of the time for ADLs/IADLs.    Time 12    Period Weeks    Status New      Long Term Additional Goals   Additional Long Term Goals Yes      OT LONG TERM GOAL #6   Title Pt will demonstrate A/ROM shoulder flexion of 120* in prep for functional reach    Time 12    Period Weeks    Status New                   Plan - 07/05/21 1142     Clinical Impression Statement 65 yo female s/p hemorrhagic CVA in 2015  with a history of hypertension, T2DM, TBI,  with residual R sided deficit, seizure, anxiety, aphasia, hyperlipidemia.Pt only recieved OT in the hospital and she has not really had any additional therapy.Pt presents to occupational therapy with the following deficits: R hemiplegia, decreased coordination, decreased balance, R inattention, cognitve deficits, decreased balance, possible sensory deficits wwhich impedes perfromance of ADLs/ IADLs. Pt can benefit from skilled occupational therapy to address these deficits in order to maximize pt's safety and independence.    OT Occupational Profile and History Detailed Assessment- Review of Records and additional review of physical, cognitive, psychosocial history related to current functional performance    Occupational performance deficits (Please refer to evaluation for details): ADL's;IADL's;Leisure;Play;Social Participation    Body Structure / Function / Physical Skills ADL;Endurance;UE functional use;Balance;Pain;Flexibility;FMC;ROM;GMC;Coordination;Decreased knowledge of precautions;Sensation;Decreased knowledge of use of DME;IADL;Dexterity;Strength;Tone;Mobility    Cognitive Skills Attention;Problem Solve;Safety Awareness;Thought    Rehab Potential Good    Clinical Decision Making Several treatment options, min-mod task modification necessary   increased time, interpreter, demonstration due to aphasia   Comorbidities  Affecting Occupational Performance: May have comorbidities impacting occupational performance   hypertension, T2DM, hemorrhagic stroke (2015) with residual R sided deficit, seizure, anxiety, aphasia, hyperlipidemia. Dizziness   Modification or Assistance to Complete Evaluation  Min-Moderate modification of tasks or assist with assess necessary to complete eval   aphasia, inattention, increased time, lenght of time since onset   OT Frequency 2x / week   plus eval   OT Duration 12 weeks    OT Treatment/Interventions Self-care/ADL training;Ultrasound;Visual/perceptual remediation/compensation;Patient/family education;DME and/or AE instruction;Paraffin;Passive range of motion;Balance training;Fluidtherapy;Cryotherapy;Splinting;Moist Heat;Therapeutic exercise;Manual Therapy;Therapeutic activities;Cognitive remediation/compensation;Electrical Stimulation;Neuromuscular education    Plan initiate HEP    Consulted and Agree with Plan of Care Patient;Family member/caregiver   dtr in Sports coach, interpreter  Patient will benefit from skilled therapeutic intervention in order to improve the following deficits and impairments:   Body Structure / Function / Physical Skills: ADL, Endurance, UE functional use, Balance, Pain, Flexibility, FMC, ROM, GMC, Coordination, Decreased knowledge of precautions, Sensation, Decreased knowledge of use of DME, IADL, Dexterity, Strength, Tone, Mobility Cognitive Skills: Attention, Problem Solve, Safety Awareness, Thought     Visit Diagnosis: Hemiplegia and hemiparesis following cerebral infarction affecting right dominant side ([place]) - Plan: Ot plan of care cert/re-cert  Muscle weakness (generalized) - Plan: Ot plan of care cert/re-cert  Other lack of coordination - Plan: Ot plan of care cert/re-cert  Unsteadiness on feet - Plan: Ot plan of care cert/re-cert  Attention and concentration deficit - Plan: Ot plan of care cert/re-cert  Other abnormalities of gait and  mobility - Plan: Ot plan of care cert/re-cert    Problem List Patient Active Problem List   Diagnosis Date Noted   Dizziness 09/19/2020   Urinary frequency 09/19/2020   Encounter for counseling regarding advance directives 10/02/2019   Healthcare maintenance 08/05/2019   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 [place], [place], [address] Phone: [phone]   Fax:  [phone]  Name: Amy [name] MRN: [id number] Date of Birth: [birth year]

## 2021-07-05 NOTE — Therapy (Signed)
[facility] [address] [place], [place], [address] Phone: [phone]   Fax:  [phone]  Physical Therapy Treatment  Patient Details  Name: Amy [name] MRN: [id number] Date of Birth: 11/02/1955 Referring Provider (PT): Dorris Singh   Encounter Date: 07/05/2021   PT End of Session - 07/05/21 0938     Visit Number 4    Number of Visits 16    Date for PT Re-Evaluation 08/11/21    Authorization Type medicare so 10th visit progress note    PT Start Time [phone]   patient arriving late   PT Stop Time 1015    PT Time Calculation (min) 37 min    Activity Tolerance Patient tolerated treatment well    Behavior During Therapy WFL for tasks assessed/performed             Past Medical History:  Diagnosis Date   Allergy    Anxiety    Brain injury ([place])    HX OF TRAUMATIC BRAIN INJURY   Constipation    RARE PER PT   Diabetes mellitus    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   Subjective Assessment - 07/05/21 0939     Subjective No new changes. Has been feeling well. No pain or falls.    Patient is accompained by: Interpreter;Family member   daughter in Sports coach and interpreter, Rosemarie Ax   Pertinent History hypertension, T2DM, hemorrhagic stroke (2015) with residual R sided deficit, seizure, anxiety, aphasia, hyperlipidemia. Dizziness    Patient Stated Goals Pt wants to work on speech, arm and leg with improving her balance.    Currently in Pain? No/denies              [facility] Adult PT  Treatment/Exercise - 07/05/21 0001       Ambulation/Gait   Ambulation/Gait Yes    Ambulation/Gait Assistance 5: Supervision    Ambulation/Gait Assistance Details ambulation into/out of therapy session    Assistive device None    Gait Pattern Step-through pattern;Decreased arm swing - right;Decreased step length - left;Decreased stance time - right    Ambulation Surface Level;Indoor             Vestibular Treatment/Exercise - 07/05/21 0001       Vestibular Treatment/Exercise   Vestibular Treatment Provided Gaze    Habituation Exercises Laruth Bouchard Daroff;Seated Horizontal Head Turns;Seated Vertical Head Turns;Standing Vertical Head Turns;Standing Diagonal Head Turns    Gaze Exercises X1 Viewing Horizontal;X1 Viewing Vertical      Nestor Lewandowsky   Number of Reps  5    Symptom Description  mild dizziness with return to upright, overall patient tolerating well.      Seated Horizontal Head Turns   Number of Reps  5    Symptom Description  no dizziness      Seated Vertical Head Turns   Number of Reps  5    Symptom Description  no  dizziness      Standing Vertical Head Turns   Number of Reps  10    Symptom Description  no dizziness      Standing Diagonal Head Turns   Number of Reps  10    Symptiom Description  no dizziness      X1 Viewing Horizontal   Foot Position seated    Reps 3    Comments x 30 seconds, x 60 seconds. mild dizziness      X1 Viewing Vertical   Foot Position seated    Reps 3    Comments x 30 seconds, x 60 seconds             Updated HEP:  Access Code: [email or web address] URL: [email or web address] Date: 07/05/2021 Prepared by: Baldomero Lamy  Exercises Walking March - 1 x daily - 7 x weekly - 3 sets - 10 reps Tandem Walking - 1 x daily - 7 x weekly - 3 sets - 10 reps Walking with Head Rotation - 1 x daily - 7 x weekly - 3 sets - 10 reps Seated Gaze Stabilization with Head Rotation - 1 x daily - 7 x weekly - 1 sets - 2-3 reps - 60 seconds  hold Seated Gaze Stabilization with Head Nod - 1 x daily - 7 x weekly - 1 sets - 2-3 reps - 60 seconds hold Brandt-Daroff Vestibular Exercise - 2 x daily - 7 x weekly - 1 sets - 5 reps     PT Education - 07/05/21 1019     Education Details Vestibular Additions to HEP    Person(s) Educated Patient;Child(ren)    Methods Explanation;Handout;Verbal cues;Demonstration    Comprehension Returned demonstration;Verbalized understanding;Verbal cues required              PT Short Term Goals - 06/22/21 1119       PT SHORT TERM GOAL #1   Title Pt will be able to perform initial HEP for strengthening and balance with supervision of family.    Time 4    Period Weeks    Status New    Target Date 07/20/21      PT SHORT TERM GOAL #2   Title Pt will decrease 5 x sit to stand from 13.95 sec to <11 sec for improved balance and functional strength.    Baseline 06/21/21 13.95 sec from chair without hands    Time 4    Period Weeks    Status New    Target Date 07/20/21      PT SHORT TERM GOAL #3   Title Pt will report minimal to no dizziness with daily activities for improved function.    Time 4    Period Weeks    Status New    Target Date 07/20/21      PT SHORT TERM GOAL #4   Title Pt will ambulate up/down 4 steps mod I in reciprocal pattern without rails for improved access to home.    Time 4    Period Weeks    Status New    Target Date 07/20/21      PT SHORT TERM GOAL #5   Title FGA will be assessed and LTG written.    Time 4    Period Weeks    Status New    Target Date 07/20/21               PT Long Term Goals - 06/22/21 1122       PT LONG TERM GOAL #1  Title Pt will be independent with progressive HEP for strengthening and balance to continue with family at home.    Time 8    Period Weeks    Status New    Target Date 08/11/21      PT LONG TERM GOAL #2   Title Pt will increase SLS to >10 sec each leg for improved balance.    Baseline 06/21/21 3 sec    Time 8     Period Weeks    Status New    Target Date 08/11/21      PT LONG TERM GOAL #3   Title Pt will ambulate >1000' on varied surfaces independently for improved community mobility.    Time 8    Period Weeks    Status New    Target Date 08/11/21      PT LONG TERM GOAL #4   Title FGA goal TBD    Time 8    Period Weeks    Status New    Target Date 08/11/21      PT LONG TERM GOAL #5   Title Pt will increase gait speed to >1.33m/s for improved community safety.    Baseline 06/21/21 0.8m/s    Time 8    Period Weeks    Status New    Target Date 08/11/21                   Plan - 07/05/21 1021     Clinical Impression Statement Session limited due to patient arriving late today. Session spent initiating VOR x 1 and habituation exercises to improve dizziness. Completed VOR x 1 seated with patient tolerating well, did require increased verbalc ues for proper completion. With habituation most symptomatic with brandt - daroff. No symptomes with head nods/turns. Updated HEP and provided new HEP. Will continue to benefit from skilled PT services.    Personal Factors and Comorbidities Time since onset of injury/illness/exacerbation;Comorbidity 3+    Comorbidities hypertension, T2DM, hemorrhagic stroke (2015) with residual R sided deficit, seizure, anxiety, aphasia, hyperlipidemia. Dizziness    Examination-Activity Limitations Locomotion Level;Transfers;Stairs;Stand;Squat    Examination-Participation Restrictions Art gallery manager;Yard Work    Merchant navy officer Evolving/Moderate complexity    Rehab Potential Good    PT Frequency 2x / week    PT Duration 8 weeks    PT Treatment/Interventions ADLs/Self Care Home Management;DME Instruction;Gait training;Stair training;Functional mobility training;Therapeutic activities;Canalith Repostioning;Therapeutic exercise;Balance training;Neuromuscular re-education;Manual techniques;Patient/family education;Vestibular    PT Next  Visit Plan Pt will need spanish interpreter. Added VOR x 1 and Brandt-Daroff to HEP. Continued habituation, adding to high level balance. Pt is aphasic but able to follow commands and response to yes/no. Unable to read/write/speak per her report. Continue RLE strengthening HEP with focus on hip strength and high level balance.  Plan is for 8 weeks but only had schedule for 4 so that when seen by OT and ST can try to add on with them.    Consulted and Agree with Plan of Care Patient;Family member/caregiver    Family Member Consulted daughter-in-law             Patient will benefit from skilled therapeutic intervention in order to improve the following deficits and impairments:  Abnormal gait, Decreased mobility, Decreased strength, Impaired sensation, Dizziness, Decreased balance  Visit Diagnosis: Other abnormalities of gait and mobility  Unsteadiness on feet  Muscle weakness (generalized)     Problem List Patient Active Problem List   Diagnosis Date Noted   Dizziness 09/19/2020  Urinary frequency 09/19/2020   Encounter for counseling regarding advance directives 10/02/2019   Healthcare maintenance 08/05/2019   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) ([place]) 09/19/2015   Right facial numbness 02/09/2015   Anxiety state 10/28/2014   Language barrier to communication 10/28/2014   Cephalalgia    Change in vision 03/02/2014   Seizures ([place]) 02/24/2014   Hemorrhagic stroke ([place]) 02/16/2014   Essential hypertension 02/16/2014   DM type 2 (diabetes mellitus, type 2) ([place]) 02/16/2014   Aphasia as late effect of stroke 02/16/2014   Paralysis ([place]) 02/16/2014   Hyperlipidemia 02/16/2014    Jones Bales, PT, DPT 07/05/2021, 10:26 AM  [facility] [address] [place], [place], [address] Phone: [phone]   Fax:  [phone]  Name: Angeletta Goelz MRN: [id number] Date of Birth:  08-21-1956

## 2021-07-06 ENCOUNTER — Encounter: Payer: Medicare Other | Admitting: Occupational Therapy

## 2021-07-07 ENCOUNTER — Ambulatory Visit: Payer: Medicare Other | Admitting: Physical Therapy

## 2021-07-07 ENCOUNTER — Ambulatory Visit: Payer: Medicare Other

## 2021-07-07 ENCOUNTER — Encounter: Payer: Self-pay | Admitting: Physical Therapy

## 2021-07-07 ENCOUNTER — Other Ambulatory Visit: Payer: Self-pay

## 2021-07-07 DIAGNOSIS — R2689 Other abnormalities of gait and mobility: Secondary | ICD-10-CM | POA: Diagnosis not present

## 2021-07-07 DIAGNOSIS — R2681 Unsteadiness on feet: Secondary | ICD-10-CM

## 2021-07-07 DIAGNOSIS — I69351 Hemiplegia and hemiparesis following cerebral infarction affecting right dominant side: Secondary | ICD-10-CM

## 2021-07-07 DIAGNOSIS — R4701 Aphasia: Secondary | ICD-10-CM

## 2021-07-07 DIAGNOSIS — R482 Apraxia: Secondary | ICD-10-CM

## 2021-07-07 DIAGNOSIS — R278 Other lack of coordination: Secondary | ICD-10-CM

## 2021-07-07 DIAGNOSIS — M6281 Muscle weakness (generalized): Secondary | ICD-10-CM

## 2021-07-07 DIAGNOSIS — R1310 Dysphagia, unspecified: Secondary | ICD-10-CM

## 2021-07-07 NOTE — Therapy (Signed)
[facility] [address] [place], [place], [address] Phone: [phone]   Fax:  [phone]  Physical Therapy Treatment  Patient Details  Name: Amy [name] MRN: [id number] Date of Birth: 08-Aug-1956 Referring Provider (PT): Dorris Singh   Encounter Date: 07/07/2021   PT End of Session - 07/07/21 1618     Visit Number 5    Number of Visits 16    Date for PT Re-Evaluation 08/11/21    Authorization Type medicare so 10th visit progress note    PT Start Time 1533    PT Stop Time 1613    PT Time Calculation (min) 40 min    Equipment Utilized During Treatment Gait belt    Activity Tolerance Patient tolerated treatment well    Behavior During Therapy WFL for tasks assessed/performed             Past Medical History:  Diagnosis Date   Allergy    Anxiety    Brain injury ([place])    HX OF TRAUMATIC BRAIN INJURY   Constipation    RARE PER PT   Diabetes mellitus    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   Subjective Assessment - 07/07/21 1539     Subjective Has been doing the exercises, going well. No dizziness.    Patient is accompained by: Interpreter;Family member   daughter in Sports coach and interpreter, Rosemarie Ax   Pertinent History hypertension, T2DM, hemorrhagic stroke (2015) with residual R sided deficit, seizure, anxiety, aphasia, hyperlipidemia. Dizziness    Patient Stated Goals Pt wants to work on speech, arm and leg with improving her balance.    Currently in Pain? No/denies                                     Balance Exercises - 07/07/21 1545       Balance Exercises: Standing   Standing Eyes Closed Narrow base of support (BOS);Foam/compliant surface;30 secs;Limitations;3 reps    Standing Eyes Closed Limitations on air ex - hip width > feet together, incr postural sway with feet together    SLS with Vectors Intermittent upper extremity assist;Limitations    SLS with Vectors Limitations on air ex: alternating foot taps to cones x10 reps bilat, forward/cross body foot tap x10 reps bilat, intermittent UE support for balance, needing cues throughout from interpreter    Rockerboard Anterior/posterior;EO;EC;Limitations    Rockerboard Limitations eyes open: x10 reps head turns, x10 reps head nods. eyes closed 2 x 30 seconds, eyes closed 2 x 5 reps head turns, 2 x 5 reps head nods with min A at times for balance with nods. no dizziness    Step Ups Lateral;Forward;6 inch;Limitations    Step Ups Limitations using RLE: 2 x 10 reps forward floating non stance leg, x10 reps lateral floating non stance leg  Sit to Stand Foam/compliant surface;Limitations    Sit to Stand Limitations with RLE staggered posteriorly on air ex x10 reps, no UE support    Other Standing Exercises on blue mats: forward/retro marching down and back 3 reps, forward/backwards tandem gait down and back 3 reps, cues to go slowly                  PT Short Term Goals - 06/22/21 1119       PT SHORT TERM GOAL #1   Title Pt will be able to perform initial HEP for strengthening and balance with supervision of family.    Time 4    Period Weeks    Status New    Target Date 07/20/21      PT SHORT TERM GOAL #2   Title Pt will decrease 5 x sit to stand from 13.95 sec to <11 sec for improved balance and functional strength.    Baseline 06/21/21 13.95 sec from chair without hands    Time 4    Period Weeks    Status New    Target Date 07/20/21      PT SHORT TERM GOAL #3   Title Pt  will report minimal to no dizziness with daily activities for improved function.    Time 4    Period Weeks    Status New    Target Date 07/20/21      PT SHORT TERM GOAL #4   Title Pt will ambulate up/down 4 steps mod I in reciprocal pattern without rails for improved access to home.    Time 4    Period Weeks    Status New    Target Date 07/20/21      PT SHORT TERM GOAL #5   Title FGA will be assessed and LTG written.    Time 4    Period Weeks    Status New    Target Date 07/20/21               PT Long Term Goals - 06/22/21 1122       PT LONG TERM GOAL #1   Title Pt will be independent with progressive HEP for strengthening and balance to continue with family at home.    Time 8    Period Weeks    Status New    Target Date 08/11/21      PT LONG TERM GOAL #2   Title Pt will increase SLS to >10 sec each leg for improved balance.    Baseline 06/21/21 3 sec    Time 8    Period Weeks    Status New    Target Date 08/11/21      PT LONG TERM GOAL #3   Title Pt will ambulate >1000' on varied surfaces independently for improved community mobility.    Time 8    Period Weeks    Status New    Target Date 08/11/21      PT LONG TERM GOAL #4   Title FGA goal TBD    Time 8    Period Weeks    Status New    Target Date 08/11/21      PT LONG TERM GOAL #5   Title Pt will increase gait speed to >1.73m/s for improved community safety.    Baseline 06/21/21 0.37m/s    Time 8    Period Weeks    Status New    Target Date 08/11/21  Plan - 07/07/21 1619     Clinical Impression Statement Today's skilled session focused on balance strategies with SLS tasks, compliant surfaces, vision removed, and head motions. Pt with incr difficulty with eyes closed and head nods, needing min A. Pt reporting no dizziness throughout today's session.    Personal Factors and Comorbidities Time since onset of injury/illness/exacerbation;Comorbidity 3+    Comorbidities  hypertension, T2DM, hemorrhagic stroke (2015) with residual R sided deficit, seizure, anxiety, aphasia, hyperlipidemia. Dizziness    Examination-Activity Limitations Locomotion Level;Transfers;Stairs;Stand;Squat    Examination-Participation Restrictions Art gallery manager;Yard Work    Merchant navy officer Evolving/Moderate complexity    Rehab Potential Good    PT Frequency 2x / week    PT Duration 8 weeks    PT Treatment/Interventions ADLs/Self Care Home Management;DME Instruction;Gait training;Stair training;Functional mobility training;Therapeutic activities;Canalith Repostioning;Therapeutic exercise;Balance training;Neuromuscular re-education;Manual techniques;Patient/family education;Vestibular    PT Next Visit Plan Pt will need spanish interpreter. Added VOR x 1 and Brandt-Daroff to HEP-review as needed. Continued habituation, adding to high level balance. Pt is aphasic but able to follow commands and response to yes/no. Unable to read/write/speak per her report. Continue RLE strengthening HEP with focus on hip strength and high level balance.  Plan is for 8 weeks but only had schedule for 4 so that when seen by OT and ST can try to add on with them.    Consulted and Agree with Plan of Care Patient;Family member/caregiver    Family Member Consulted daughter-in-law             Patient will benefit from skilled therapeutic intervention in order to improve the following deficits and impairments:  Abnormal gait, Decreased mobility, Decreased strength, Impaired sensation, Dizziness, Decreased balance  Visit Diagnosis: Muscle weakness (generalized)  Hemiplegia and hemiparesis following cerebral infarction affecting right dominant side (HCC)  Other lack of coordination  Unsteadiness on feet     Problem List Patient Active Problem List   Diagnosis Date Noted   Dizziness 09/19/2020   Urinary frequency 09/19/2020   Encounter for counseling regarding advance  directives 10/02/2019   Healthcare maintenance 08/05/2019   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 [place], [place], [address] Phone: [phone]   Fax:  [phone]  Name: Amy [name] MRN: [id number] Date of Birth: June 19, 1956

## 2021-07-08 NOTE — Therapy (Signed)
[facility] [address] [place], [place], [address] Phone: [phone]   Fax:  [phone]  Speech Language Pathology Evaluation  Patient Details  Name: Amy [name] MRN: [id number] Date of Birth: November 21, 1955 Referring Provider (SLP): Martyn Malay, MD   Encounter Date: 07/07/2021   End of Session - 07/07/21 1626     Visit Number 1    Number of Visits 25    Date for SLP Re-Evaluation 09/29/21    Authorization Type Medicare/Medicaid    SLP Start Time 1445    SLP Stop Time  4132    SLP Time Calculation (min) 45 min    Activity Tolerance Patient tolerated treatment well             Past Medical History:  Diagnosis Date   Allergy    Anxiety    Brain injury ([place])    HX OF TRAUMATIC BRAIN INJURY   Constipation    RARE PER PT   Diabetes mellitus    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       SLP Evaluation [facility] - 07/07/21 1444       SLP Visit Information   SLP Received On 06/23/21    Referring Provider (SLP) Martyn Malay, MD    Onset Date 2014    Medical Diagnosis Nontraumatic intracerebral hemorrhage      Subjective   Patient/Family Stated Goal "to pronounce her words" per husband      Pain Assessment   Currently in Pain? No/denies      General Information   HPI Pt referred with history of hemorrhagic stroke in 2014 & 2015. Pt is spanish speaking and interpreter present. Pt has right hemiparesis. She has difficulty with speech. Reports understands questions but can  not speak, read or write.      Balance Screen   Has the patient fallen in the past 6 months No    Has the patient had a decrease in activity level because of a fear of falling?  No    Is the patient reluctant to leave their home because of a fear of falling?  No      Prior Functional Status   Cognitive/Linguistic Baseline Within functional limits    Type of Home House     Lives With Family   son, daughter in Sports coach, granddaughter   Available Support Family    Education ~8th grade    Vocation On disability      Cognition   Overall Cognitive Status Difficult to assess    Difficult to assess due to Impaired communication      Auditory Comprehension   Overall Auditory Comprehension Impaired    Yes/No Questions Impaired    Basic Biographical Questions 76-100% accurate    Basic Immediate Environment Questions 75-100% accurate    Complex Questions 50-74% accurate    Conversation Simple    EffectiveTechniques Extra processing time;Repetition  Reading Comprehension   Reading Status Impaired    Word level 26-50% accurate    Sentence Level 0-25% accurate    Paragraph Level Not tested      Expression   Primary Mode of Expression Nonverbal - gestures      Verbal Expression   Overall Verbal Expression Impaired    Initiation Impaired    Automatic Speech --   unable to verbalize automatic speech tasks   Level of Generative/Spontaneous Verbalization Word    Repetition Impaired    Level of Impairment Word level    Naming Impairment    Responsive 0-25% accurate    Confrontation 0-24% accurate    Convergent 0-24% accurate    Divergent 0-24% accurate    Verbal Errors Neologisms    Effective Techniques Phonemic cues;Articulatory cues   inconsistent, rare   Non-Verbal Means of Communication Gestures      Oral Motor/Sensory Function   Overall Oral Motor/Sensory Function Impaired    Labial Coordination Reduced    Lingual ROM Within Functional Limits    Lingual Symmetry Within  Functional Limits    Lingual Coordination Reduced    Facial Symmetry Within Functional Limits      Motor Speech   Overall Motor Speech Impaired    Phonation Normal    Motor Planning Impaired    Level of Impairment Word    Motor Speech Errors Inconsistent      Standardized Assessments   Standardized Assessments  Other Assessment   Quick Aphasia Battery (severe=2.71)                            SLP Education - 07/07/21 1625     Education Details eval results, possible goals, focus on communication supports    Person(s) Educated Patient;Spouse    Methods Explanation;Demonstration    Comprehension Verbalized understanding;Returned demonstration;Need further instruction              SLP Short Term Goals - 07/07/21 1636       SLP SHORT TERM GOAL #1   Title Pt will ID and select correct AAC icon with 80% accuracy given choice of 6 over 2 sessions    Time 4    Period Weeks    Status New    Target Date 08/04/21      SLP SHORT TERM GOAL #2   Title Pt will use multimodal communication as able to augment verbal expression for communication of wants/needs given occasional min A over 2 sessions    Time 4    Period Weeks    Status New    Target Date 08/04/21      SLP SHORT TERM GOAL #3   Title Pt will use external communication support for 4/5 opportunities to augment verbal expression given occasional min A over 2 sessions    Time 4    Period Weeks    Status New    Target Date 08/04/21      SLP SHORT TERM GOAL #4   Title SLP will assess pt abilty to write/type for multimodal communciation in first few ST sessions    Time 4    Period Weeks    Status New    Target Date 08/04/21      SLP SHORT TERM GOAL #5   Title Pt will participate in clinical bedside swallow evaluation as needed if family reports s/sx of dysphagia    Time 4    Period Weeks    Status  New    Target Date 08/04/21              SLP Long Term Goals - 07/08/21 1440       SLP  LONG TERM GOAL #1   Title Pt will spontaneously use augmentative communication support for 4/5 opportunities to augment verbal expression over 2 sessions    Time 8    Period Weeks    Status New    Target Date 09/01/21      SLP LONG TERM GOAL #2   Title Pt will effectively communicate wants/needs/biographical information via augmentative comm system with 90% accuracy given rare min A over 2 sessions    Time 12    Period Weeks    Status New    Target Date 09/29/21      SLP LONG TERM GOAL #3   Title Pt/caregiver will indicate less frustration with use of augmentative communication system by last ST session    Time 12    Period Weeks    Status New    Target Date 09/29/21              Plan - 07/07/21 1626     Clinical Impression Statement Amy [name] was referred for OPST secondary to aphasia from stroke in 2014. Pt is Spanish speaking and was accompanied by husband and interpreter. Pt briefly received ST intervention in 2017 with no other ST intervention reported since then. Pt is largely non-verbal and relies on gestures and pointing to aid communication. When SLP inquired if pt able to say any single words, pt able to independently verbalize names x4 and "garlic" with no other verbal output exhibited during evaluation. Quick Aphasia Battery completed, which revealed severe expressive aphasia, mild receptive aphasia, and moderate verbal apraxia. Pt was unable to verbalize or introduce own self (pt pointed at husband to introduce her) or verbalize any relevant biographical information. Pt exhibited inconsistent yes/no gestures for simple and mod-complex questions. Pt unable to verbally name pictures, repeat words/phrases, or read words/phrases/instructions (no instructions completed when assessing comprehension). Picture naming was 85% accurate for ID of picture given verbal prompt. Pt's husband reports she relies on "signals and pointing" to communciate at home, with no other communication system  in place. SLP educated family that limited recovery of verbal communication expected at this time given time since onset (7-8 years) and recommends use of communication supports/aid to augment verbal expression. Due to time constraints, swallow function was not discussed but previous medical documentation revealed swallowing may needed to be addressed during ST intervention. SLP recommends skilled ST intervention to address communication to optimize pt's ability to communicate her wants/needs/etc more effectively, and possibly dysphagia to maximize safety during PO intake.    Speech Therapy Frequency 2x / week    Duration 12 weeks   scheduled 12 weeks to account for scheduling and missed visits; anticipate d/c after 8 weeks after communication system established   Treatment/Interventions Patient/family education;Functional tasks;Multimodal communcation approach;Language facilitation;Compensatory techniques;Internal/external aids;SLP instruction and feedback    Potential to Achieve Goals Fair    Potential Considerations Severity of impairments;Other (comment)   time since onset   Consulted and Agree with Plan of Care Patient;Family member/caregiver             Patient will benefit from skilled therapeutic intervention in order to improve the following deficits and impairments:   Aphasia  Verbal apraxia  Dysphagia, unspecified type    Problem List Patient Active Problem List   Diagnosis Date Noted  Dizziness 09/19/2020   Urinary frequency 09/19/2020   Encounter for counseling regarding advance directives 10/02/2019   Healthcare maintenance 08/05/2019   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 [place], [place], [address] Phone: [phone]   Fax:  [phone]  Name: Amy [name] MRN: [id number] Date of Birth: 1956/08/06

## 2021-07-10 ENCOUNTER — Encounter: Payer: Medicare Other | Admitting: Occupational Therapy

## 2021-07-11 ENCOUNTER — Other Ambulatory Visit: Payer: Self-pay

## 2021-07-11 ENCOUNTER — Ambulatory Visit: Payer: Medicare Other | Admitting: Occupational Therapy

## 2021-07-11 DIAGNOSIS — I69351 Hemiplegia and hemiparesis following cerebral infarction affecting right dominant side: Secondary | ICD-10-CM

## 2021-07-11 DIAGNOSIS — R2689 Other abnormalities of gait and mobility: Secondary | ICD-10-CM | POA: Diagnosis not present

## 2021-07-11 DIAGNOSIS — M6281 Muscle weakness (generalized): Secondary | ICD-10-CM

## 2021-07-11 DIAGNOSIS — R41840 Attention and concentration deficit: Secondary | ICD-10-CM

## 2021-07-11 NOTE — Therapy (Signed)
[facility] [address] [place], [place], [address] Phone: [phone]   Fax:  [phone]  Occupational Therapy Treatment  Patient Details  Name: Amy [name] MRN: [id number] Date of Birth: 1956/06/08 Referring Provider (OT): Dr. Owens Shark   Encounter Date: 07/11/2021   OT End of Session - 07/11/21 1213     Visit Number 2    Number of Visits 25    Date for OT Re-Evaluation 09/27/21    Authorization Type Medicare/ Medicaid    Authorization Time Period POC written for 12 weeks may d/c after 8 dependent on progress.    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   Subjective Assessment - 07/11/21 1104     Subjective  Pt s/p CVA 2015, and she only recieved therapy in the hospital    Pertinent History 65yo female with a history of hypertension, T2DM, hemorrhagic stroke (2015) with residual R sided deficit, seizure, anxiety, aphasia, hyperlipidemia, hx of TBI    Limitations h/o seizure 7 years ago    Patient Stated Goals  be more independent    Currently in Pain? No/denies            Pt's husband and interpreter present for today's visit (Daughter and granddaughter asked to stay in waiting room today due to limited space in clinic and age of granddaughter)  See pt instructions for details on HEP for neuro re-educ. Reviewed proper positioning extensively with patient/spouse via interpreter. Interpreter also wrote down instructions for proper technique/position in Spanish on printed HEP                      OT Education - 07/11/21 1212     Education Details HEP for RUE neuro re-educ and simple functional use    Person(s) Educated Patient;Spouse    Methods Explanation;Demonstration;Verbal cues;Handout   via interpreter   Comprehension Verbal cues required;Need further instruction;Verbalized understanding;Tactile cues required              OT Short Term Goals - 07/11/21 1214       OT SHORT TERM GOAL #1   Title I with HEP    Time 4    Period Weeks  Status On-going    Target Date 08/02/21      OT SHORT TERM GOAL #2   Title Pt will perform bathing with supervision    Time 4    Period Weeks    Status New      OT SHORT TERM GOAL #3   Title Pt will use RUE as a stabilizer/ gross A at least 25% of the time.    Time 4    Period Weeks    Status New      OT SHORT TERM GOAL #4   Title Pt will demonstrate ability to grasp/ relase a 2 inch block 2/5 trials.    Time 4    Period Weeks    Status New      OT SHORT TERM GOAL #5   Title Pt will consistently perform light home management with no more than min A.    Time 4    Period Weeks    Status New               OT Long Term Goals - 07/05/21 1136       OT LONG TERM GOAL #1   Title I with updated HEP.    Time 12    Period Weeks    Status New    Target Date 09/27/21      OT LONG TERM GOAL #2   Title Pt will perform all basic ADLS modified independently    Time 12    Period Weeks    Status New      OT LONG  TERM GOAL #3   Title Pt will perform basic home management modified independently    Time 12    Period Weeks    Status New      OT LONG TERM GOAL #4   Title Pt will grasp/ release a 1 inch block 4/5 trials with RUE    Time 12    Period Weeks    Status New      OT LONG TERM GOAL #5   Title Pt will use RUE as a stabilizer/ gross A at least 40% of the time for ADLs/IADLs.    Time 12    Period Weeks    Status New      Long Term Additional Goals   Additional Long Term Goals Yes      OT LONG TERM GOAL #6   Title Pt will demonstrate A/ROM shoulder flexion of 120* in prep for functional reach    Time 12    Period Weeks    Status New                   Plan - 07/11/21 1214     Clinical Impression Statement Pt/family with greater understanding of proper positioning RUE however pt will need review/reinforcement secondary to language barrier, aphasia, and learned improper reaching patterns    OT Occupational Profile and History Detailed Assessment- Review of Records and additional review of physical, cognitive, psychosocial history related to current functional performance    Occupational performance deficits (Please refer to evaluation for details): ADL's;IADL's;Leisure;Play;Social Participation    Body Structure / Function / Physical Skills ADL;Endurance;UE functional use;Balance;Pain;Flexibility;FMC;ROM;GMC;Coordination;Decreased knowledge of precautions;Sensation;Decreased knowledge of use of DME;IADL;Dexterity;Strength;Tone;Mobility    Cognitive Skills Attention;Problem Solve;Safety Awareness;Thought    Rehab Potential Good    Clinical Decision Making Several treatment options, min-mod task modification necessary   increased time, interpreter, demonstration due to aphasia   Comorbidities Affecting Occupational Performance: May have comorbidities  impacting occupational performance   hypertension, T2DM, hemorrhagic stroke (2015) with residual R sided deficit, seizure, anxiety,  aphasia, hyperlipidemia. Dizziness   Modification or Assistance to Complete Evaluation  Min-Moderate modification of tasks or assist with assess necessary to complete eval   aphasia, inattention, increased time, lenght of time since onset   OT Frequency 2x / week   plus eval   OT Duration 12 weeks    OT Treatment/Interventions Self-care/ADL training;Ultrasound;Visual/perceptual remediation/compensation;Patient/family education;DME and/or AE instruction;Paraffin;Passive range of motion;Balance training;Fluidtherapy;Cryotherapy;Splinting;Moist Heat;Therapeutic exercise;Manual Therapy;Therapeutic activities;Cognitive remediation/compensation;Electrical Stimulation;Neuromuscular education    Plan review HEP, consider either pre-fab neoprene CMC splint or thermoplastic custom CMC splint to hold thumb in better and more functional position    Consulted and Agree with Plan of Care Patient;Family member/caregiver   dtr in law, interpreter            Patient will benefit from skilled therapeutic intervention in order to improve the following deficits and impairments:   Body Structure / Function / Physical Skills: ADL, Endurance, UE functional use, Balance, Pain, Flexibility, FMC, ROM, GMC, Coordination, Decreased knowledge of precautions, Sensation, Decreased knowledge of use of DME, IADL, Dexterity, Strength, Tone, Mobility Cognitive Skills: Attention, Problem Solve, Safety Awareness, Thought     Visit Diagnosis: Hemiplegia and hemiparesis following cerebral infarction affecting right dominant side (HCC)  Muscle weakness (generalized)  Attention and concentration deficit    Problem List Patient Active Problem List   Diagnosis Date Noted   Dizziness 09/19/2020   Urinary frequency 09/19/2020   Encounter for counseling regarding advance directives 10/02/2019   Healthcare maintenance 08/05/2019   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) ([place]) 09/19/2015   Right facial  numbness 02/09/2015   Anxiety state 10/28/2014   Language barrier to communication 10/28/2014   Cephalalgia    Change in vision 03/02/2014   Seizures ([place]) 02/24/2014   Hemorrhagic stroke ([place]) 02/16/2014   Essential hypertension 02/16/2014   DM type 2 (diabetes mellitus, type 2) ([place]) 02/16/2014   Aphasia as late effect of stroke 02/16/2014   Paralysis ([place]) 02/16/2014   Hyperlipidemia 02/16/2014    Carey Bullocks, OTR/L 07/11/2021, 12:17 PM  [facility] [address] [place], [place], [address] Phone: [phone]   Fax:  [phone]  Name: Amy [name] MRN: [id number] Date of Birth: 1956/09/12

## 2021-07-11 NOTE — Patient Instructions (Addendum)
Flexion (Assistive)    Clasp hands together and raise arms above head, keeping elbows as straight as possible. Can be done sitting or lying. Repeat __10__ times. Do _2___ sessions per day.   SHOULDER: Flexion On Table    Place hands on table under folded towel. Slide towel out in front of table and back x 10, then big circles clockwise x 10 reps. Do 2 sessions per day.    SHOULDER: Flexion Bilateral    LAYING DOWN: Raise arms overhead at same speed. Keep elbows straight. _10__ reps per set, __2_ sets per day. Hold paper towel roll.   *Sitting up: reach down to floor with paper towel roll, holding at each end palms facings. ** Do 10 reps, 2 times per day  Combination Movement (Active)    Keep elbow firmly at side with wrist straight. Turn palm up and then down. Repeat _10___ times. Do __2__ sessions per day.  **Pick up narrow cup or plastic bottle and hold upright with Rt hand, then place back on table and release - with help from Left hand **Pick up block, lego, or bottle caps with Rt hand

## 2021-07-12 ENCOUNTER — Ambulatory Visit: Payer: Medicare Other

## 2021-07-12 DIAGNOSIS — R2689 Other abnormalities of gait and mobility: Secondary | ICD-10-CM | POA: Diagnosis not present

## 2021-07-12 DIAGNOSIS — R482 Apraxia: Secondary | ICD-10-CM

## 2021-07-12 DIAGNOSIS — M6281 Muscle weakness (generalized): Secondary | ICD-10-CM

## 2021-07-12 DIAGNOSIS — R4701 Aphasia: Secondary | ICD-10-CM

## 2021-07-12 NOTE — Patient Instructions (Signed)
Comidas       TV      Familia      Informacion Personal

## 2021-07-12 NOTE — Therapy (Signed)
[facility] [address] [place], [place], [address] Phone: [phone]   Fax:  [phone]  Speech Language Pathology Treatment  Patient Details  Name: Amy [name] MRN: [id number] Date of Birth: July 30, 1956 Referring Provider (SLP): Martyn Malay, MD   Encounter Date: 07/12/2021   End of Session - 07/12/21 1514     Visit Number 2    Number of Visits 25    Date for SLP Re-Evaluation 09/29/21    Authorization Type Medicare/Medicaid    SLP Start Time 1530    SLP Stop Time  2841    SLP Time Calculation (min) 45 min    Activity Tolerance Patient tolerated treatment well             Past Medical History:  Diagnosis Date   Allergy    Anxiety    Brain injury ([place])    HX OF TRAUMATIC BRAIN INJURY   Constipation    RARE PER PT   Diabetes mellitus    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   Subjective Assessment - 07/12/21 1530     Subjective nodded head    Patient is accompained by: Family member    Currently in Pain? No/denies                   ADULT SLP TREATMENT - 07/12/21 1513       General Information   Behavior/Cognition Alert;Cooperative;Pleasant mood      Treatment Provided   Treatment provided Cognitive-Linquistic      Cognitive-Linquistic Treatment   Treatment focused on Aphasia;Apraxia    Skilled Treatment Family member and interpreter present. SLP re-educated rationale for AAC device due to severity of apraxia and time since onset. SLP trialed locating  various store icons on device, with usual cues required to scan and scroll. Inconsistent awareness of errors exhibited. Pt able to ID favorites store icons with rare min A. SLP targeted writing, in which pt able to write names of children with intermittent mod A for spelling. Pt able to verbalize name x1. Pt able to write birth year. SLP provided HEP to write favorite things to include in [place] trials. SLP requested Lingraphica trial.      Assessment / Recommendations / Plan   Plan Continue with current plan of care      Progression Toward Goals   Progression toward goals Progressing toward goals              SLP Education - 07/12/21 1622     Education Details rationale for AAC, writing/naming HEP    Person(s) Educated Patient;Caregiver(s)    Methods Explanation;Demonstration;Handout;Verbal cues    Comprehension Verbalized understanding;Returned demonstration;Need further instruction;Verbal cues required              SLP Short Term Goals - 07/12/21 1514       SLP SHORT TERM GOAL #1   Title Pt will  ID and select correct AAC icon with 80% accuracy given choice of 6 over 2 sessions    Time 4    Period Weeks    Status On-going    Target Date 08/04/21      SLP SHORT TERM GOAL #2   Title Pt will use multimodal communication as able to augment verbal expression for communication of wants/needs given occasional min A over 2 sessions    Time 4    Period Weeks    Status On-going    Target Date 08/04/21      SLP SHORT TERM GOAL #3   Title Pt will use external communication support for 4/5 opportunities to augment verbal expression given occasional min A over 2 sessions    Time 4    Period Weeks    Status On-going    Target Date 08/04/21      SLP SHORT TERM GOAL #4   Title SLP will assess pt abilty to write/type for multimodal communciation in first few ST sessions    Time 4    Period Weeks    Status On-going    Target Date 08/04/21      SLP SHORT TERM GOAL #5    Title Pt will participate in clinical bedside swallow evaluation as needed if family reports s/sx of dysphagia    Time 4    Period Weeks    Status On-going    Target Date 08/04/21              SLP Long Term Goals - 07/12/21 1624       SLP LONG TERM GOAL #1   Title Pt will spontaneously use augmentative communication support for 4/5 opportunities to augment verbal expression over 2 sessions    Time 8    Period Weeks    Status On-going    Target Date 09/01/21      SLP LONG TERM GOAL #2   Title Pt will effectively communicate wants/needs/biographical information via augmentative comm system with 90% accuracy given rare min A over 2 sessions    Time 12    Period Weeks    Status On-going    Target Date 09/29/21      SLP LONG TERM GOAL #3   Title Pt/caregiver will indicate less frustration with use of augmentative communication system by last ST session    Time 12    Period Weeks    Status On-going    Target Date 09/29/21              Plan - 07/12/21 1514     Clinical Impression Statement Lelan Pons was referred for OPST intervention secondary to aphasia from stroke in 2014. Pt is Spanish speaking and was accompanied by family member and interpreter this session . SLP educated family that limited recovery of verbal communication expected at this time given time since onset (7-8 years) and recommends use of communication supports/aid to augment verbal expression. SLP trialed use of Lingraphica and writing this session, which was inconsistent for scrolling and spelling. Due to time constraints, swallow function was not discussed but previous medical documentation revealed swallowing may needed to be addressed during ST intervention. SLP recommends skilled ST intervention to address communication to optimize pt's ability to communicate her wants/needs/etc more effectively, and possibly dysphagia to maximize safety during PO intake.    Speech Therapy Frequency 2x / week    Duration 12  weeks   scheduled 12 weeks to account for scheduling and missed visits; anticiate d/c after  8 weeks after communication system established   Treatment/Interventions Patient/family education;Functional tasks;Multimodal communcation approach;Language facilitation;Compensatory techniques;Internal/external aids;SLP instruction and feedback    Potential to Achieve Goals Fair    Potential Considerations Severity of impairments;Other (comment)   time since onset   Consulted and Agree with Plan of Care Patient;Family member/caregiver             Patient will benefit from skilled therapeutic intervention in order to improve the following deficits and impairments:   Aphasia  Verbal apraxia    Problem List Patient Active Problem List   Diagnosis Date Noted   Dizziness 09/19/2020   Urinary frequency 09/19/2020   Encounter for counseling regarding advance directives 10/02/2019   Healthcare maintenance 08/05/2019   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) ([place]) 09/19/2015   Right facial numbness 02/09/2015   Anxiety state 10/28/2014   Language barrier to communication 10/28/2014   Cephalalgia    Change in vision 03/02/2014   Seizures ([place]) 02/24/2014   Hemorrhagic stroke ([place]) 02/16/2014   Essential hypertension 02/16/2014   DM type 2 (diabetes mellitus, type 2) ([place]) 02/16/2014   Aphasia as late effect of stroke 02/16/2014   Paralysis ([place]) 02/16/2014   Hyperlipidemia 02/16/2014    Alinda Deem, MA CCC-SLP 07/12/2021, 4:25 PM  [facility] [address] [place], [place], [address] Phone: [phone]   Fax:  [phone]   Name: Vina Byrd MRN: [id number] Date of Birth: 05/24/1956

## 2021-07-12 NOTE — Therapy (Signed)
[facility] [address] [place], [place], [address] Phone: [phone]   Fax:  [phone]  Physical Therapy Treatment  Patient Details  Name: Amy [name] MRN: [id number] Date of Birth: [birth year] Referring Provider (PT): Dorris Singh   Encounter Date: 07/12/2021   PT End of Session - 07/12/21 1448     Visit Number 6    Number of Visits 16    Date for PT Re-Evaluation 08/11/21    Authorization Type medicare so 10th visit progress note    PT Start Time 1446    PT Stop Time 1526    PT Time Calculation (min) 40 min    Equipment Utilized During Treatment Gait belt    Activity Tolerance Patient tolerated treatment well    Behavior During Therapy WFL for tasks assessed/performed             Past Medical History:  Diagnosis Date   Allergy    Anxiety    Brain injury ([place])    HX OF TRAUMATIC BRAIN INJURY   Constipation    RARE PER PT   Diabetes mellitus    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   Subjective Assessment - 07/12/21 1448     Subjective Pt denies any dizziness or issues since last visit. Exercises going well.    Patient is accompained by: Interpreter;Family member   daughter in Sports coach and interpreter, Alleen Borne   Pertinent History hypertension, T2DM, hemorrhagic stroke (2015) with residual R sided deficit, seizure, anxiety, aphasia, hyperlipidemia. Dizziness    Patient Stated Goals Pt wants to work on speech, arm and leg with improving her balance.    Currently in Pain? No/denies                 [facility] PT Assessment - 07/12/21 1449       Functional Gait  Assessment   Gait assessed  Yes    Gait Level Surface Walks 20 ft in less than 7 sec but greater than 5.5 sec, uses assistive device, slower speed, mild gait deviations, or deviates 6-10 in outside of the 12 in walkway width.    Change in Gait Speed Able to smoothly change walking speed without loss of balance or gait deviation. Deviate no more than 6 in outside of the 12 in walkway width.    Gait with Horizontal Head Turns Performs head turns smoothly with no change in gait. Deviates no more than 6 in outside 12 in walkway width    Gait with Vertical Head Turns Performs head turns with no change in gait. Deviates no more than 6 in outside 12 in walkway width.    Gait and Pivot Turn Pivot turns safely within 3 sec and stops quickly with no loss of balance.    Step Over Obstacle Is able to step over 2 stacked shoe boxes taped together (9 in total height) without changing gait speed. No evidence  of imbalance.    Gait with Narrow Base of Support Is able to ambulate for 10 steps heel to toe with no staggering.    Gait with Eyes Closed Walks 20 ft, no assistive devices, good speed, no evidence of imbalance, normal gait pattern, deviates no more than 6 in outside 12 in walkway width. Ambulates 20 ft in less than 7 sec.    Ambulating Backwards Walks 20 ft, no assistive devices, good speed, no evidence for imbalance, normal gait    Steps Alternating feet, no rail.    Total Score 29                           [facility] Adult PT Treatment/Exercise - 07/12/21 1449       Transfers   Five time sit to stand comments  14.56 sec without hands from low mat.      Ambulation/Gait   Ambulation/Gait Yes    Ambulation/Gait Assistance 7: Independent    Ambulation/Gait Assistance Details Pt was instructed to try to get more arm swing.    Ambulation Distance (Feet) 1000 Feet    Assistive device None    Gait Pattern  Step-through pattern;Decreased arm swing - right    Ambulation Surface Level;Unlevel;Indoor;Outdoor;Paved;Grass    Stairs Yes    Stairs Assistance 7: Independent    Stair Management Technique Alternating pattern    Number of Stairs 12    Height of Stairs 4      Neuro Re-ed    Neuro Re-ed Details  At bottom of steps: standing on rockerboard positioned ant/post alternating taps on 2nd step x 10 each foot, maintaining level eyes closed 30 sec x 2 CGA then looking up/down  x 10. Turned board to lateral position and repeated eyes closed 30 sec x 2 CGA, head turns lateral x 10. Alternating tapping on soccerball x 10 without UE support then SLS with 1 foot on ball moving fornt back and side to side x 10 each foot close SBA.                     PT Education - 07/12/21 1526     Education Details Adding in soccerball moving with SLS to HEP. PT discussed progress towards goals faster than anticipated and may d/c early next visit pending any changes.    Person(s) Educated Patient;Child(ren)    Methods Explanation;Demonstration    Comprehension Verbalized understanding;Returned demonstration              PT Short Term Goals - 07/12/21 1508       PT SHORT TERM GOAL #1   Title Pt will be able to perform initial HEP for strengthening and balance with supervision of family.    Baseline Pt denies any issues with current HEP and has been performing    Time 4    Period Weeks    Status Achieved    Target Date 07/20/21      PT SHORT TERM GOAL #2   Title Pt will decrease 5 x sit to stand from 13.95 sec to <11 sec for improved balance and functional strength.    Baseline 06/21/21 13.95 sec from chair without hands    Time 4    Period Weeks    Status New    Target Date 07/20/21      PT SHORT TERM GOAL #3   Title Pt will report minimal to no dizziness with daily activities for improved function.  Baseline 07/12/21 Pt denies any dizziness over past week.    Time 4    Period Weeks     Status Achieved    Target Date 07/20/21      PT SHORT TERM GOAL #4   Title Pt will ambulate up/down 4 steps mod I in reciprocal pattern without rails for improved access to home.    Baseline 07/12/21 12 steps independently in reciprocal pattern    Time 4    Period Weeks    Status Achieved    Target Date 07/20/21      PT SHORT TERM GOAL #5   Title FGA will be assessed and LTG written.    Baseline 06/28/21 was 21/30    Time 4    Period Weeks    Status Achieved    Target Date 07/20/21               PT Long Term Goals - 07/12/21 1509       PT LONG TERM GOAL #1   Title Pt will be independent with progressive HEP for strengthening and balance to continue with family at home.    Time 8    Period Weeks    Status New      PT LONG TERM GOAL #2   Title Pt will increase SLS to >10 sec each leg for improved balance.    Baseline 06/21/21 3 sec    Time 8    Period Weeks    Status New      PT LONG TERM GOAL #3   Title Pt will ambulate >1000' on varied surfaces independently for improved community mobility.    Time 8    Period Weeks    Status New      PT LONG TERM GOAL #4   Title FGA will increase to >24/30 for improved balance and gait safety.    Baseline 07/12/21 29/30    Time 8    Period Weeks    Status Achieved      PT LONG TERM GOAL #5   Title Pt will increase gait speed to >1.43m/s for improved community safety.    Baseline 06/21/21 0.32m/s    Time 8    Period Weeks    Status New                   Plan - 07/12/21 1957     Clinical Impression Statement Pt is progressing well towards goals. Started assessing goals early and pt met gait goal, stair goal, HEP goal and FGA goal. Pt now low fall risk based on FGA score of 29/30. She denies any dizziness over the past week.    Personal Factors and Comorbidities Time since onset of injury/illness/exacerbation;Comorbidity 3+    Comorbidities hypertension, T2DM, hemorrhagic stroke (2015) with residual R sided deficit,  seizure, anxiety, aphasia, hyperlipidemia. Dizziness    Examination-Activity Limitations Locomotion Level;Transfers;Stairs;Stand;Squat    Examination-Participation Restrictions Art gallery manager;Yard Work    Merchant navy officer Evolving/Moderate complexity    Rehab Potential Good    PT Frequency 2x / week    PT Duration 8 weeks    PT Treatment/Interventions ADLs/Self Care Home Management;DME Instruction;Gait training;Stair training;Functional mobility training;Therapeutic activities;Canalith Repostioning;Therapeutic exercise;Balance training;Neuromuscular re-education;Manual techniques;Patient/family education;Vestibular    PT Next Visit Plan Continue to assess remaining goals. May discharge early in next 1-2 visits pending no changes with patient progressing faster than anticipated. Pt will need spanish interpreter. Added VOR x 1 and [place] to HEP-review as needed. Continued habituation, adding  to high level balance. Pt is aphasic but able to follow commands and response to yes/no. Unable to read/write/speak per her report. Continue RLE strengthening HEP with focus on hip strength and high level balance.    Consulted and Agree with Plan of Care Patient;Family member/caregiver    Family Member Consulted daughter-in-law             Patient will benefit from skilled therapeutic intervention in order to improve the following deficits and impairments:  Abnormal gait, Decreased mobility, Decreased strength, Impaired sensation, Dizziness, Decreased balance  Visit Diagnosis: Other abnormalities of gait and mobility  Muscle weakness (generalized)     Problem List Patient Active Problem List   Diagnosis Date Noted   Dizziness 09/19/2020   Urinary frequency 09/19/2020   Encounter for counseling regarding advance directives 10/02/2019   Healthcare maintenance 08/05/2019   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 [place], [place], [address] Phone: [phone]   Fax:  [phone]  Name: Amy [name] MRN: [id number] Date of Birth: [birth year]

## 2021-07-14 ENCOUNTER — Ambulatory Visit: Payer: Medicare Other | Admitting: Physical Therapy

## 2021-07-14 ENCOUNTER — Other Ambulatory Visit: Payer: Self-pay

## 2021-07-14 ENCOUNTER — Encounter: Payer: Self-pay | Admitting: Physical Therapy

## 2021-07-14 ENCOUNTER — Ambulatory Visit: Payer: Medicare Other

## 2021-07-14 DIAGNOSIS — R4701 Aphasia: Secondary | ICD-10-CM

## 2021-07-14 DIAGNOSIS — R278 Other lack of coordination: Secondary | ICD-10-CM

## 2021-07-14 DIAGNOSIS — R482 Apraxia: Secondary | ICD-10-CM

## 2021-07-14 DIAGNOSIS — M6281 Muscle weakness (generalized): Secondary | ICD-10-CM

## 2021-07-14 DIAGNOSIS — R2689 Other abnormalities of gait and mobility: Secondary | ICD-10-CM

## 2021-07-14 DIAGNOSIS — I69351 Hemiplegia and hemiparesis following cerebral infarction affecting right dominant side: Secondary | ICD-10-CM

## 2021-07-14 DIAGNOSIS — R2681 Unsteadiness on feet: Secondary | ICD-10-CM

## 2021-07-14 NOTE — Therapy (Signed)
[facility] [address] [place], [place], [address] Phone: [phone]   Fax:  [phone]  Physical Therapy Treatment/Discharge Summary  Patient Details  Name: Amy [name] MRN: [id number] Date of Birth: [birth year] Referring Provider (PT): Dorris Singh   Encounter Date: 07/14/2021   PT End of Session - 07/14/21 1520     Visit Number 7    Number of Visits 16    Date for PT Re-Evaluation 08/11/21    Authorization Type medicare so 10th visit progress note    PT Start Time 1448   full time not used to D/C   PT Stop Time 1515    PT Time Calculation (min) 27 min    Equipment Utilized During Treatment Gait belt    Activity Tolerance Patient tolerated treatment well    Behavior During Therapy WFL for tasks assessed/performed             Past Medical History:  Diagnosis Date   Allergy    Anxiety    Brain injury ([place])    HX OF TRAUMATIC BRAIN INJURY   Constipation    RARE PER PT   Diabetes mellitus    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   Subjective Assessment - 07/14/21 1451     Subjective No dizziness. Has been doing the exercises at home.    Patient is accompained by: Interpreter;Family member   daughter in Sports coach and interpreter, Alleen Borne   Pertinent History hypertension, T2DM, hemorrhagic stroke (2015) with residual R sided deficit, seizure, anxiety, aphasia, hyperlipidemia. Dizziness    Patient Stated Goals Pt wants to work on speech, arm and leg with improving her balance.     Currently in Pain? No/denies                               [facility] Adult PT Treatment/Exercise - 07/14/21 1455       Ambulation/Gait   Ambulation/Gait Yes    Ambulation/Gait Assistance 7: Independent    Assistive device None    Gait Pattern Step-through pattern;Decreased arm swing - right    Ambulation Surface Level;Indoor    Gait velocity 9.7 seconds = 1.02 m/s      Neuro Re-ed    Neuro Re-ed Details  RLE 17 seconds LLE 20 seconds                Access Code: [email or web address] URL: [email or web address] Date: 07/14/2021 Prepared by: Janann August  Reviewed final HEP with pt's daughter in law. Pt has been consistently performing at home. See MedBridge for more details.   Exercises Walking March - 1 x daily - 7 x weekly - 3 sets - 10 reps Tandem Walking - 1 x daily - 7 x weekly - 3 sets - 10 reps Walking with Head Rotation - 1 x daily - 7 x weekly - 3 sets -  10 reps Seated Gaze Stabilization with Head Rotation - 1 x daily - 7 x weekly - 1 sets - 2-3 reps - 60 seconds hold Seated Gaze Stabilization with Head Nod - 1 x daily - 7 x weekly - 1 sets - 2-3 reps - 60 seconds hold Brandt-Daroff Vestibular Exercise - 2 x daily - 7 x weekly - 1 sets - 5 reps     PT Education - 07/14/21 1518     Education Details Progress towards goals, reviewed HEP, if in future pt notices a worsening in balance/strength will need a new referral to return to PD from MD    Person(s) Educated Patient   daughter in law   Methods Explanation;Demonstration    Comprehension Verbalized understanding;Returned demonstration            PHYSICAL THERAPY DISCHARGE SUMMARY  Visits from Start of Care: 7  Current functional level related to goals / functional outcomes: See LTGs.   Remaining deficits: Impaired high level balance.   Education / Equipment: HEP   Patient agrees to discharge. Patient goals were met. Patient is being discharged due to meeting the stated  rehab goals.    PT Short Term Goals - 07/12/21 1508       PT SHORT TERM GOAL #1   Title Pt will be able to perform initial HEP for strengthening and balance with supervision of family.    Baseline Pt denies any issues with current HEP and has been performing    Time 4    Period Weeks    Status Achieved    Target Date 07/20/21      PT SHORT TERM GOAL #2   Title Pt will decrease 5 x sit to stand from 13.95 sec to <11 sec for improved balance and functional strength.    Baseline 06/21/21 13.95 sec from chair without hands    Time 4    Period Weeks    Status New    Target Date 07/20/21      PT SHORT TERM GOAL #3   Title Pt will report minimal to no dizziness with daily activities for improved function.    Baseline 07/12/21 Pt denies any dizziness over past week.    Time 4    Period Weeks    Status Achieved    Target Date 07/20/21      PT SHORT TERM GOAL #4   Title Pt will ambulate up/down 4 steps mod I in reciprocal pattern without rails for improved access to home.    Baseline 07/12/21 12 steps independently in reciprocal pattern    Time 4    Period Weeks    Status Achieved    Target Date 07/20/21      PT SHORT TERM GOAL #5   Title FGA will be assessed and LTG written.    Baseline 06/28/21 was 21/30    Time 4    Period Weeks    Status Achieved    Target Date 07/20/21               PT Long Term Goals - 07/14/21 1452       PT LONG TERM GOAL #1   Title Pt will be independent with progressive HEP for strengthening and balance to continue with family at home.    Time 8    Period Weeks    Status Achieved      PT LONG TERM GOAL #2   Title Pt will increase SLS to >10 sec each leg for  improved balance.    Baseline 06/21/21 3 sec, RLE 17 seconds LLE 20 seconds on 07/14/21    Time 8    Period Weeks    Status Achieved      PT LONG TERM GOAL #3   Title Pt will ambulate >1000' on varied surfaces independently for improved community mobility.    Time 8    Period Weeks     Status Achieved      PT LONG TERM GOAL #4   Title FGA will increase to >24/30 for improved balance and gait safety.    Baseline 07/12/21 29/30    Time 8    Period Weeks    Status Achieved      PT LONG TERM GOAL #5   Title Pt will increase gait speed to >1.5m/s for improved community safety.    Baseline 06/21/21 0.65m/s; 9.7 seconds = 1.02 m/s on 07/14/21    Time 8    Period Weeks    Status Achieved                   Plan - 07/14/21 1524     Clinical Impression Statement Checked remainder of LTGs today with pt meeting 5 out of 5 LTGs. Due to pt's significant progress with PT, will D/C early at this time with pt in agreement with plan. Reviewed entirety of HEP today with pt performing consistently at home. Pt continues with no dizziness.    Personal Factors and Comorbidities Time since onset of injury/illness/exacerbation;Comorbidity 3+    Comorbidities hypertension, T2DM, hemorrhagic stroke (2015) with residual R sided deficit, seizure, anxiety, aphasia, hyperlipidemia. Dizziness    Examination-Activity Limitations Locomotion Level;Transfers;Stairs;Stand;Squat    Examination-Participation Restrictions Psychologist, forensic;Yard Work    Conservation officer, historic buildings Evolving/Moderate complexity    Rehab Potential Good    PT Frequency 2x / week    PT Duration 8 weeks    PT Treatment/Interventions ADLs/Self Care Home Management;DME Instruction;Gait training;Stair training;Functional mobility training;Therapeutic activities;Canalith Repostioning;Therapeutic exercise;Balance training;Neuromuscular re-education;Manual techniques;Patient/family education;Vestibular    PT Next Visit Plan D/C    Consulted and Agree with Plan of Care Patient;Family member/caregiver    Family Member Consulted daughter-in-law             Patient will benefit from skilled therapeutic intervention in order to improve the following deficits and impairments:  Abnormal gait, Decreased  mobility, Decreased strength, Impaired sensation, Dizziness, Decreased balance  Visit Diagnosis: Other abnormalities of gait and mobility  Muscle weakness (generalized)  Hemiplegia and hemiparesis following cerebral infarction affecting right dominant side (HCC)  Other lack of coordination  Unsteadiness on feet     Problem List Patient Active Problem List   Diagnosis Date Noted   Dizziness 09/19/2020   Urinary frequency 09/19/2020   Encounter for counseling regarding advance directives 10/02/2019   Healthcare maintenance 08/05/2019   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 [place], [place], [address] Phone: [phone]  Fax:  [phone]  Name: Shandra Szymborski MRN: [id number] Date of Birth: 02/03/1956

## 2021-07-14 NOTE — Therapy (Signed)
[facility] [address] [place], [place], [address] Phone: [phone]   Fax:  [phone]  Speech Language Pathology Treatment  Patient Details  Name: Stephannie Broner MRN: [id number] Date of Birth: May 28, 1956 Referring Provider (SLP): Martyn Malay, MD   Encounter Date: 07/14/2021   End of Session - 07/14/21 1520     Visit Number 3    Number of Visits 25    Date for SLP Re-Evaluation 09/29/21    Authorization Type Medicare/Medicaid    SLP Start Time 1408    SLP Stop Time  8099    SLP Time Calculation (min) 37 min    Activity Tolerance Patient tolerated treatment well             Past Medical History:  Diagnosis Date   Allergy    Anxiety    Brain injury ([place])    HX OF TRAUMATIC BRAIN INJURY   Constipation    RARE PER PT   Diabetes mellitus    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   Subjective Assessment - 07/14/21 1412     Subjective pt arrived late    Currently in Pain? No/denies                   ADULT SLP TREATMENT - 07/14/21 1412       General Information   Behavior/Cognition Alert;Cooperative;Pleasant mood;Requires cueing      Treatment Provided   Treatment provided Cognitive-Linquistic      Cognitive-Linquistic Treatment   Treatment focused on Aphasia;Apraxia    Skilled Treatment Pt completed HEP to write some favorite items. Spelling errors and reversed letters exhibited, requiring consistent verbal and visual cues to correct. SLP trialed alphabet  board to assess if spelling improved with use of visual cues. Inconsistent spelling errors exhibited for children's names. Some intermittent error awareness noted, but not consistent. SLP targeted use of Lingraphica device. Pt exhibited reduced comprehension when locating items given entirety of options. SLP modified device from 5 to 3 icons per page, which appeared effective to aid attention. Usual repetition required to aid comprehension of targeted items.      Assessment / Recommendations / Plan   Plan Continue with current plan of care      Progression Toward Goals   Progression toward goals Progressing toward goals              SLP Education - 07/14/21 1520     Education Details HEP    Person(s) Educated Patient;Caregiver(s)    Methods Explanation;Demonstration;Verbal cues;Handout    Comprehension Verbalized understanding;Returned demonstration;Verbal cues required              SLP Short Term Goals - 07/14/21 1521       SLP SHORT TERM GOAL #1   Title Pt will ID and select correct AAC icon with 80% accuracy given choice of 6 over 2 sessions  Time 4    Period Weeks    Status On-going    Target Date 08/04/21      SLP SHORT TERM GOAL #2   Title Pt will use multimodal communication as able to augment verbal expression for communication of wants/needs given occasional min A over 2 sessions    Time 4    Period Weeks    Status On-going    Target Date 08/04/21      SLP SHORT TERM GOAL #3   Title Pt will use external communication support for 4/5 opportunities to augment verbal expression given occasional min A over 2 sessions    Time 4    Period Weeks    Status On-going    Target Date 08/04/21      SLP SHORT TERM GOAL #4   Title SLP will assess pt abilty to write/type for multimodal communciation in first few ST sessions    Status Achieved    Target Date 08/04/21      SLP SHORT TERM GOAL #5   Title Pt will participate in clinical bedside swallow evaluation as  needed if family reports s/sx of dysphagia    Time 4    Period Weeks    Status On-going    Target Date 08/04/21              SLP Long Term Goals - 07/14/21 1521       SLP LONG TERM GOAL #1   Title Pt will spontaneously use augmentative communication support for 4/5 opportunities to augment verbal expression over 2 sessions    Time 8    Period Weeks    Status On-going    Target Date 09/01/21      SLP LONG TERM GOAL #2   Title Pt will effectively communicate wants/needs/biographical information via augmentative comm system with 90% accuracy given rare min A over 2 sessions    Time 12    Period Weeks    Status On-going    Target Date 09/29/21      SLP LONG TERM GOAL #3   Title Pt/caregiver will indicate less frustration with use of augmentative communication system by last ST session    Time 12    Period Weeks    Status On-going    Target Date 09/29/21              Plan - 07/14/21 1520     Clinical Impression Statement Lelan Pons was referred for OPST intervention secondary to aphasia from stroke in 2014. Pt is Spanish speaking and was accompanied by family member and interpreter this session. SLP educated family that limited recovery of verbal communication expected at this time given time since onset (7-8 years) and recommends use of communication supports/aid to augment verbal expression. SLP trialed use of Lingraphica and writing this session, which was inconsistent related to comprehension and possible alexia. Due to time constraints, swallow function was not discussed but previous medical documentation revealed swallowing may needed to be addressed during ST intervention. SLP recommends skilled ST intervention to address communication to optimize pt's ability to communicate her wants/needs/etc more effectively, and possibly dysphagia to maximize safety during PO intake.    Speech Therapy Frequency 2x / week    Duration 12 weeks   scheduled 12 weeks to account for  scheduling and missed visits; anticiate d/c after 8 weeks after communication system established   Treatment/Interventions Patient/family education;Functional tasks;Multimodal communcation approach;Language facilitation;Compensatory techniques;Internal/external aids;SLP instruction and feedback    Potential to [place]  Potential Considerations Severity of impairments;Other (comment)   time since onset   Consulted and Agree with Plan of Care Patient;Family member/caregiver             Patient will benefit from skilled therapeutic intervention in order to improve the following deficits and impairments:   Aphasia  Verbal apraxia    Problem List Patient Active Problem List   Diagnosis Date Noted   Dizziness 09/19/2020   Urinary frequency 09/19/2020   Encounter for counseling regarding advance directives 10/02/2019   Healthcare maintenance 08/05/2019   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) ([place]) 09/19/2015   Right facial numbness 02/09/2015   Anxiety state 10/28/2014   Language barrier to communication 10/28/2014   Cephalalgia    Change in vision 03/02/2014   Seizures ([place]) 02/24/2014   Hemorrhagic stroke ([place]) 02/16/2014   Essential hypertension 02/16/2014   DM type 2 (diabetes mellitus, type 2) ([place]) 02/16/2014   Aphasia as late effect of stroke 02/16/2014   Paralysis ([place]) 02/16/2014   Hyperlipidemia 02/16/2014    Alinda Deem, MA CCC-SLP 07/14/2021, 3:22 PM  [facility] [address] [place], [place], [address] Phone: [phone]   Fax:  [phone]   Name: Rebekka Lobello MRN: [id number] Date of Birth: [birth year]

## 2021-07-17 ENCOUNTER — Ambulatory Visit: Payer: Medicare Other | Attending: Family Medicine

## 2021-07-17 ENCOUNTER — Ambulatory Visit: Payer: Medicare Other | Admitting: Occupational Therapy

## 2021-07-17 ENCOUNTER — Encounter: Payer: Self-pay | Admitting: Occupational Therapy

## 2021-07-17 ENCOUNTER — Other Ambulatory Visit: Payer: Self-pay

## 2021-07-17 DIAGNOSIS — R4701 Aphasia: Secondary | ICD-10-CM | POA: Insufficient documentation

## 2021-07-17 DIAGNOSIS — R482 Apraxia: Secondary | ICD-10-CM | POA: Diagnosis present

## 2021-07-17 DIAGNOSIS — R2681 Unsteadiness on feet: Secondary | ICD-10-CM

## 2021-07-17 DIAGNOSIS — R278 Other lack of coordination: Secondary | ICD-10-CM | POA: Diagnosis present

## 2021-07-17 DIAGNOSIS — R2689 Other abnormalities of gait and mobility: Secondary | ICD-10-CM | POA: Insufficient documentation

## 2021-07-17 DIAGNOSIS — I69351 Hemiplegia and hemiparesis following cerebral infarction affecting right dominant side: Secondary | ICD-10-CM

## 2021-07-17 DIAGNOSIS — M6281 Muscle weakness (generalized): Secondary | ICD-10-CM | POA: Diagnosis present

## 2021-07-17 DIAGNOSIS — R41840 Attention and concentration deficit: Secondary | ICD-10-CM | POA: Insufficient documentation

## 2021-07-17 NOTE — Therapy (Signed)
[facility] [address] [place], [place], [address] Phone: [phone]   Fax:  [phone]  Speech Language Pathology Treatment  Patient Details  Name: Amy [name] MRN: [id number] Date of Birth: 08-12-1956 Referring Provider (SLP): Martyn Malay, MD   Encounter Date: 07/17/2021   End of Session - 07/17/21 1333     Visit Number 4    Number of Visits 25    Date for SLP Re-Evaluation 09/29/21    Authorization Type Medicare/Medicaid    SLP Start Time 1318    SLP Stop Time  1400    SLP Time Calculation (min) 42 min    Activity Tolerance Patient tolerated treatment well             Past Medical History:  Diagnosis Date   Allergy    Anxiety    Brain injury    HX OF TRAUMATIC BRAIN INJURY   Constipation    RARE PER PT   Diabetes mellitus    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   Subjective Assessment - 07/17/21 1404     Subjective showed SLP homework for writing    Patient is accompained by: Family member    Currently in Pain? No/denies                   ADULT SLP TREATMENT - 07/17/21 1331       General Information   Behavior/Cognition Alert;Cooperative;Pleasant mood;Requires cueing      Treatment Provided   Treatment provided Cognitive-Linquistic      Cognitive-Linquistic Treatment   Treatment focused on Aphasia;Apraxia    Skilled Treatment Pt returned with homework completed for writing family member names. Less letter reversal exhibited. SLP targeted use of  Lingraphica device. SLP targeted categories with less icons (9-19), in which pt was >80% accuracy to ID icons given verbal prompt. Intermittent error awareness exhibited when wrong icon selected. SLP introduced use of drawing to augment verbal expression, in which pt able to draw pictures x3 with adequate accuracy given verbal prompt.      Assessment / Recommendations / Plan   Plan Continue with current plan of care      Progression Toward Goals   Progression toward goals Progressing toward goals              SLP Education - 07/17/21 1407     Education Details drawing as compensation, improved accuracy for scrolling    Person(s) Educated Patient;Spouse    Methods Explanation;Demonstration;Verbal cues;Handout    Comprehension Verbalized understanding;Returned demonstration;Verbal cues required;Need further instruction              SLP Short Term Goals - 07/17/21 1402       SLP SHORT TERM GOAL #1   Title Pt will ID and select correct AAC icon with 80% accuracy given choice of 6 over 2 sessions    Baseline 07-17-21  Time 4    Period Weeks    Status On-going    Target Date 08/04/21      SLP SHORT TERM GOAL #2   Title Pt will use multimodal communication as able to augment verbal expression for communication of wants/needs given occasional min A over 2 sessions    Time 4    Period Weeks    Status On-going    Target Date 08/04/21      SLP SHORT TERM GOAL #3   Title Pt will use external communication support for 4/5 opportunities to augment verbal expression given occasional min A over 2 sessions    Time 4    Period Weeks    Status On-going    Target Date 08/04/21      SLP SHORT TERM GOAL #4   Title SLP will assess pt abilty to write/type for multimodal communciation in first few ST sessions    Status Achieved    Target Date 08/04/21      SLP SHORT TERM GOAL #5   Title Pt will participate in clinical bedside swallow evaluation as needed if family reports s/sx of  dysphagia    Time 4    Period Weeks    Status On-going    Target Date 08/04/21              SLP Long Term Goals - 07/17/21 1403       SLP LONG TERM GOAL #1   Title Pt will spontaneously use augmentative communication support for 4/5 opportunities to augment verbal expression over 2 sessions    Time 8    Period Weeks    Status On-going    Target Date 09/01/21      SLP LONG TERM GOAL #2   Title Pt will effectively communicate wants/needs/biographical information via augmentative comm system with 90% accuracy given rare min A over 2 sessions    Time 12    Period Weeks    Status On-going    Target Date 09/29/21      SLP LONG TERM GOAL #3   Title Pt/caregiver will indicate less frustration with use of augmentative communication system by last ST session    Time 12    Period Weeks    Status On-going    Target Date 09/29/21              Plan - 07/17/21 1357     Clinical Impression Statement Amy [name] was referred for OPST intervention secondary to aphasia from stroke in 2014. Pt is Spanish speaking and was accompanied by family member and interpreter this session. SLP recommends use of communication supports/aid to augment verbal expression due to severity of aphasia/apraxia. SLP continued trials of Lingraphica and writing this session, with improved accuracy exhibited for comprehension of AAC icons. SLP introduced drawing as well this session to augment verbal expression. Due to time constraints, swallow function was not discussed but previous medical documentation revealed swallowing may needed to be addressed during ST intervention. SLP recommends skilled ST intervention to address communication to optimize pt's ability to communicate her wants/needs/etc more effectively, and possibly dysphagia to maximize safety during PO intake.    Speech Therapy Frequency 2x / week    Duration 12 weeks   scheduled 12 weeks to account for scheduling and missed visits; anticiate d/c after 8  weeks after communication system established   Treatment/Interventions Patient/family education;Functional tasks;Multimodal communcation approach;Language facilitation;Compensatory techniques;Internal/external aids;SLP instruction and feedback    Potential to [place]  Potential Considerations Severity of impairments;Other (comment)   time since onset   Consulted and Agree with Plan of Care Patient;Family member/caregiver             Patient will benefit from skilled therapeutic intervention in order to improve the following deficits and impairments:   Aphasia  Verbal apraxia    Problem List Patient Active Problem List   Diagnosis Date Noted   Dizziness 09/19/2020   Urinary frequency 09/19/2020   Encounter for counseling regarding advance directives 10/02/2019   Healthcare maintenance 08/05/2019   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) ([place]) 09/19/2015   Right facial numbness 02/09/2015   Anxiety state 10/28/2014   Language barrier to communication 10/28/2014   Cephalalgia    Change in vision 03/02/2014   Seizures ([place]) 02/24/2014   Hemorrhagic stroke ([place]) 02/16/2014   Essential hypertension 02/16/2014   DM type 2 (diabetes mellitus, type 2) ([place]) 02/16/2014   Aphasia as late effect of stroke 02/16/2014   Paralysis ([place]) 02/16/2014   Hyperlipidemia 02/16/2014    Alinda Deem, MA CCC-SLP 07/17/2021, 2:08 PM  [facility] [address] [place], [place], [address] Phone: [phone]   Fax:  [phone]   Name: Amy [name] MRN: [id number] Date of Birth: 02/24/1956

## 2021-07-17 NOTE — Therapy (Signed)
[facility] [address] [place], [place], [address] Phone: [phone]   Fax:  [phone]  Occupational Therapy Treatment  Patient Details  Name: Serinity Ware MRN: [id number] Date of Birth: [birth year] Referring Provider (OT): Dr. Owens Shark   Encounter Date: 07/17/2021   OT End of Session - 07/17/21 1240     Visit Number 3    Number of Visits 25    Date for OT Re-Evaluation 09/27/21    Authorization Type Medicare/ Medicaid    Authorization Time Period POC written for 12 weeks may d/c after 8 dependent on progress.    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   Subjective Assessment - 07/17/21 1239     Subjective  denies pain    Patient is accompanied by: Family member;Interpreter    Pertinent History 65yo female with a history of hypertension, T2DM, hemorrhagic stroke (2015) with residual R sided deficit, seizure, anxiety, aphasia, hyperlipidemia, hx of TBI    Limitations h/o seizure 7 years ago    Patient Stated Goals  be more independent    Currently in Pain? No/denies             Reviewed HEP and pt returned demo each with min cueing (verbal/tactile) for improved positioning and to actively relax to release objects (small bottle and bottle caps).  Practiced functional use with RUE (all with min verbal/tactile cueing for normal movement patterns) including: Simulated bathing with RUE (tops of legs and LUE), using R hand to stabilize bottle while opening with LUE, tilting bottle fusing forearm pronation to neutral, sweeping with BUEs and wt. Shifts in standing, standing to open/close and release drawer/cabinet handle.       OT Education - 07/17/21 1416     Education Details Ways to incr RUE functional use at home (with bathing tops of legs and LUE, sweeping with BUEs, stabilize bottles while opening with LUE, and opening light drawers/doors with handle)    Person(s) Educated Patient;Spouse   via interpreter   Methods Explanation;Demonstration;Tactile cues;Verbal cues    Comprehension Verbalized understanding;Returned demonstration;Verbal cues required;Tactile cues required              OT Short Term Goals - 07/11/21 1214  OT SHORT TERM GOAL #1   Title I with HEP    Time 4    Period Weeks    Status On-going    Target Date 08/02/21      OT SHORT TERM GOAL #2   Title Pt will perform bathing with supervision    Time 4    Period Weeks    Status New      OT SHORT TERM GOAL #3   Title Pt will use RUE as a stabilizer/ gross A at least 25% of the time.    Time 4    Period Weeks    Status New      OT SHORT TERM GOAL #4   Title Pt will demonstrate ability to grasp/ relase a 2 inch block 2/5 trials.    Time 4    Period Weeks    Status New      OT SHORT TERM GOAL #5   Title Pt will consistently perform light home management with no more than min A.    Time 4    Period Weeks    Status New               OT Long Term Goals - 07/05/21 1136       OT LONG TERM GOAL #1    Title I with updated HEP.    Time 12    Period Weeks    Status New    Target Date 09/27/21      OT LONG TERM GOAL #2   Title Pt will perform all basic ADLS modified independently    Time 12    Period Weeks    Status New      OT LONG TERM GOAL #3   Title Pt will perform basic home management modified independently    Time 12    Period Weeks    Status New      OT LONG TERM GOAL #4   Title Pt will grasp/ release a 1 inch block 4/5 trials with RUE    Time 12    Period Weeks    Status New      OT LONG TERM GOAL #5   Title Pt will use RUE as a stabilizer/ gross A at least 40% of the time for ADLs/IADLs.    Time 12    Period Weeks    Status New      Long Term Additional Goals   Additional Long Term Goals Yes      OT LONG TERM GOAL #6   Title Pt will demonstrate A/ROM shoulder flexion of 120* in prep for functional reach    Time 12    Period Weeks    Status New                   Plan - 07/17/21 1243     Clinical Impression Statement Pt demo incr RUE functional use in clinic and pt/husband instructed how to incr functional use at home.  Pt is progressing with improved RUE positioning with reach, but will need continued reinforcement and cues.    OT Occupational Profile and History Detailed Assessment- Review of Records and additional review of physical, cognitive, psychosocial history related to current functional performance    Occupational performance deficits (Please refer to evaluation for details): ADL's;IADL's;Leisure;Play;Social Participation    Body Structure / Function / Physical Skills ADL;Endurance;UE functional use;Balance;Pain;Flexibility;FMC;ROM;GMC;Coordination;Decreased knowledge of precautions;Sensation;Decreased knowledge of use of DME;IADL;Dexterity;Strength;Tone;Mobility    Cognitive Skills Attention;Problem Solve;Safety Awareness;Thought  Rehab Potential Good    Clinical Decision Making Several treatment options, min-mod task modification  necessary   increased time, interpreter, demonstration due to aphasia   Comorbidities Affecting Occupational Performance: May have comorbidities impacting occupational performance   hypertension, T2DM, hemorrhagic stroke (2015) with residual R sided deficit, seizure, anxiety, aphasia, hyperlipidemia. Dizziness   Modification or Assistance to Complete Evaluation  Min-Moderate modification of tasks or assist with assess necessary to complete eval   aphasia, inattention, increased time, lenght of time since onset   OT Frequency 2x / week   plus eval   OT Duration 12 weeks    OT Treatment/Interventions Self-care/ADL training;Ultrasound;Visual/perceptual remediation/compensation;Patient/family education;DME and/or AE instruction;Paraffin;Passive range of motion;Balance training;Fluidtherapy;Cryotherapy;Splinting;Moist Heat;Therapeutic exercise;Manual Therapy;Therapeutic activities;Cognitive remediation/compensation;Electrical Stimulation;Neuromuscular education    Plan consider either pre-fab neoprene CMC splint or thermoplastic custom CMC splint to hold thumb in better and more functional position prn; RUE functional use, neuro re-ed    Consulted and Agree with Plan of Care Patient;Family member/caregiver   dtr in law, interpreter            Patient will benefit from skilled therapeutic intervention in order to improve the following deficits and impairments:   Body Structure / Function / Physical Skills: ADL, Endurance, UE functional use, Balance, Pain, Flexibility, FMC, ROM, GMC, Coordination, Decreased knowledge of precautions, Sensation, Decreased knowledge of use of DME, IADL, Dexterity, Strength, Tone, Mobility Cognitive Skills: Attention, Problem Solve, Safety Awareness, Thought     Visit Diagnosis: Hemiplegia and hemiparesis following cerebral infarction affecting right dominant side (HCC)  Other lack of coordination  Unsteadiness on feet  Attention and concentration  deficit    Problem List Patient Active Problem List   Diagnosis Date Noted   Dizziness 09/19/2020   Urinary frequency 09/19/2020   Encounter for counseling regarding advance directives 10/02/2019   Healthcare maintenance 08/05/2019   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) ([place]) 09/19/2015   Right facial numbness 02/09/2015   Anxiety state 10/28/2014   Language barrier to communication 10/28/2014   Cephalalgia    Change in vision 03/02/2014   Seizures ([place]) 02/24/2014   Hemorrhagic stroke ([place]) 02/16/2014   Essential hypertension 02/16/2014   DM type 2 (diabetes mellitus, type 2) ([place]) 02/16/2014   Aphasia as late effect of stroke 02/16/2014   Paralysis ([place]) 02/16/2014   Hyperlipidemia 02/16/2014    Yeraldine Forney, OT/L 07/17/2021, 2:24 PM  [facility] [address] [place], [place], [address] Phone: [phone]   Fax:  [phone]  Name: Lenore Moyano MRN: [id number] Date of Birth: 03-May-1956  Vianne Bulls, OTR/L [facility] [address] [place], [place]  [address] [phone] phone [phone] 07/17/21 2:24 PM

## 2021-07-18 ENCOUNTER — Ambulatory Visit
Admission: RE | Admit: 2021-07-18 | Discharge: 2021-07-18 | Disposition: A | Discharge status: Home / Self Care | Payer: Medicare Other | Source: Ambulatory Visit | Attending: Family Medicine | Admitting: Family Medicine

## 2021-07-18 DIAGNOSIS — Z1231 Encounter for screening mammogram for malignant neoplasm of breast: Secondary | ICD-10-CM

## 2021-07-19 ENCOUNTER — Ambulatory Visit: Payer: Medicare Other | Admitting: Occupational Therapy

## 2021-07-19 ENCOUNTER — Ambulatory Visit: Payer: Medicare Other

## 2021-07-19 ENCOUNTER — Other Ambulatory Visit: Payer: Self-pay

## 2021-07-19 DIAGNOSIS — I69351 Hemiplegia and hemiparesis following cerebral infarction affecting right dominant side: Secondary | ICD-10-CM

## 2021-07-19 DIAGNOSIS — M6281 Muscle weakness (generalized): Secondary | ICD-10-CM

## 2021-07-19 DIAGNOSIS — R278 Other lack of coordination: Secondary | ICD-10-CM

## 2021-07-19 DIAGNOSIS — R4701 Aphasia: Secondary | ICD-10-CM | POA: Diagnosis not present

## 2021-07-19 DIAGNOSIS — R41840 Attention and concentration deficit: Secondary | ICD-10-CM

## 2021-07-19 NOTE — Patient Instructions (Signed)
Splint Instructions (Positioning / Resting)  La frula es usada para mantener la mano en posicin. Usela por: __1-2__ horas __2-3__ veces por da.  DIA Lave la frula en agua fra con jabn segn sea necesario. Squela bien antes de usarla. Si hay reas que se tornan rojas y [employer], preste atencin y deje de usar la frula. Haga una entrevista con su terapista ocupacional para reajustar la frula.  Copyright  VHI. All rights reserved.

## 2021-07-20 NOTE — Therapy (Signed)
[facility] [address] [place], [place], [address] Phone: [phone]   Fax:  [phone]  Occupational Therapy Treatment  Patient Details  Name: Amy [name] MRN: [id number] Date of Birth: [birth year] Referring Provider (OT): Dr. Owens Shark   Encounter Date: 07/19/2021   OT End of Session - 07/20/21 0911     Visit Number 4    Number of Visits 25    Date for OT Re-Evaluation 09/27/21    Authorization Type Medicare/ Medicaid    Authorization Time Period POC written for 12 weeks may d/c after 8 dependent on progress.    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   Subjective Assessment - 07/20/21 0915     Subjective  denies pain    Patient is accompanied by: Family member;Interpreter    Pertinent History 65yo female with a history of hypertension, T2DM, hemorrhagic stroke (2015) with residual R sided deficit, seizure, anxiety, aphasia, hyperlipidemia, hx of TBI    Limitations h/o seizure 7 years ago    Patient Stated Goals be more independent    Currently  in Pain? No/denies                     Treatment: Pt was issued neoprene thumb splint. She preformed functional tasks while wearing splint such as removing dowel pegs and stabilizing container to open, min-mod facilitation/ v.c to avoid compensation. Hands clasped reach for floor and closed chain holding small ball between hands to pass forwards to therapist, min facilitation, mod v.c              OT Education - 07/20/21 0910     Education Details Education regarding neoprene thumb splint wear, care and precuations with pt/ husband, pt's husband practed donning, pt is able to doff    Person(s) Educated Patient;Spouse   interpreter assisted   Methods Explanation;Demonstration;Tactile cues;Verbal cues;Handout    Comprehension Verbalized understanding;Returned demonstration;Verbal cues required;Tactile cues required              OT Short Term Goals - 07/11/21 1214       OT SHORT TERM GOAL #1   Title I with HEP    Time 4    Period Weeks    Status On-going  Target Date 08/02/21      OT SHORT TERM GOAL #2   Title Pt will perform bathing with supervision    Time 4    Period Weeks    Status New      OT SHORT TERM GOAL #3   Title Pt will use RUE as a stabilizer/ gross A at least 25% of the time.    Time 4    Period Weeks    Status New      OT SHORT TERM GOAL #4   Title Pt will demonstrate ability to grasp/ relase a 2 inch block 2/5 trials.    Time 4    Period Weeks    Status New      OT SHORT TERM GOAL #5   Title Pt will consistently perform light home management with no more than min A.    Time 4    Period Weeks    Status New               OT Long Term Goals - 07/05/21 1136       OT LONG TERM GOAL #1   Title I with updated HEP.    Time 12    Period Weeks    Status New    Target Date 09/27/21      OT LONG TERM GOAL #2   Title Pt will perform all basic ADLS modified independently    Time 12    Period Weeks    Status New       OT LONG TERM GOAL #3   Title Pt will perform basic home management modified independently    Time 12    Period Weeks    Status New      OT LONG TERM GOAL #4   Title Pt will grasp/ release a 1 inch block 4/5 trials with RUE    Time 12    Period Weeks    Status New      OT LONG TERM GOAL #5   Title Pt will use RUE as a stabilizer/ gross A at least 40% of the time for ADLs/IADLs.    Time 12    Period Weeks    Status New      Long Term Additional Goals   Additional Long Term Goals Yes      OT LONG TERM GOAL #6   Title Pt will demonstrate A/ROM shoulder flexion of 120* in prep for functional reach    Time 12    Period Weeks    Status New                   Plan - 07/20/21 0913     Clinical Impression Statement Pt is progressing towards goals. Pt demonstrates improved thumb position with neoprene splint Pt will benefit from additional functional use of her RUE while wearing splint to avoid shoulder compensation.    OT Occupational Profile and History Detailed Assessment- Review of Records and additional review of physical, cognitive, psychosocial history related to current functional performance    Occupational performance deficits (Please refer to evaluation for details): ADL's;IADL's;Leisure;Play;Social Participation    Body Structure / Function / Physical Skills ADL;Endurance;UE functional use;Balance;Pain;Flexibility;FMC;ROM;GMC;Coordination;Decreased knowledge of precautions;Sensation;Decreased knowledge of use of DME;IADL;Dexterity;Strength;Tone;Mobility    Cognitive Skills Attention;Problem Solve;Safety Awareness;Thought    Rehab Potential Good    Clinical Decision Making Several treatment options, min-mod task modification necessary   increased time, interpreter, demonstration due to aphasia   Comorbidities Affecting Occupational Performance: May  have comorbidities impacting occupational performance   hypertension, T2DM, hemorrhagic stroke (2015) with residual R sided  deficit, seizure, anxiety, aphasia, hyperlipidemia. Dizziness   Modification or Assistance to Complete Evaluation  Min-Moderate modification of tasks or assist with assess necessary to complete eval   aphasia, inattention, increased time, lenght of time since onset   OT Frequency 2x / week   plus eval   OT Duration 12 weeks    OT Treatment/Interventions Self-care/ADL training;Ultrasound;Visual/perceptual remediation/compensation;Patient/family education;DME and/or AE instruction;Paraffin;Passive range of motion;Balance training;Fluidtherapy;Cryotherapy;Splinting;Moist Heat;Therapeutic exercise;Manual Therapy;Therapeutic activities;Cognitive remediation/compensation;Electrical Stimulation;Neuromuscular education    Plan splint check ; RUE functional use, neuro re-ed    Consulted and Agree with Plan of Care Patient;Family member/caregiver   interpreter            Patient will benefit from skilled therapeutic intervention in order to improve the following deficits and impairments:   Body Structure / Function / Physical Skills: ADL, Endurance, UE functional use, Balance, Pain, Flexibility, FMC, ROM, GMC, Coordination, Decreased knowledge of precautions, Sensation, Decreased knowledge of use of DME, IADL, Dexterity, Strength, Tone, Mobility Cognitive Skills: Attention, Problem Solve, Safety Awareness, Thought     Visit Diagnosis: Hemiplegia and hemiparesis following cerebral infarction affecting right dominant side (HCC)  Other lack of coordination  Muscle weakness (generalized)  Attention and concentration deficit    Problem List Patient Active Problem List   Diagnosis Date Noted   Dizziness 09/19/2020   Urinary frequency 09/19/2020   Encounter for counseling regarding advance directives 10/02/2019   Healthcare maintenance 08/05/2019   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) ([place]) 09/19/2015   Right facial numbness 02/09/2015   Anxiety state 10/28/2014   Language  barrier to communication 10/28/2014   Cephalalgia    Change in vision 03/02/2014   Seizures ([place]) 02/24/2014   Hemorrhagic stroke ([place]) 02/16/2014   Essential hypertension 02/16/2014   DM type 2 (diabetes mellitus, type 2) ([place]) 02/16/2014   Aphasia as late effect of stroke 02/16/2014   Paralysis ([place]) 02/16/2014   Hyperlipidemia 02/16/2014    Kamdin Follett, OT/L 07/20/2021, 9:15 AM  [facility] [address] [place], [place], [address] Phone: [phone]   Fax:  [phone]  Name: Amy [name] MRN: [id number] Date of Birth: 1955/10/19

## 2021-07-21 ENCOUNTER — Ambulatory Visit: Payer: Medicare Other

## 2021-07-21 ENCOUNTER — Other Ambulatory Visit: Payer: Self-pay | Admitting: Student

## 2021-07-21 ENCOUNTER — Other Ambulatory Visit: Payer: Self-pay

## 2021-07-21 DIAGNOSIS — R4701 Aphasia: Secondary | ICD-10-CM

## 2021-07-21 DIAGNOSIS — Z794 Long term (current) use of insulin: Secondary | ICD-10-CM

## 2021-07-21 DIAGNOSIS — I10 Essential (primary) hypertension: Secondary | ICD-10-CM

## 2021-07-21 DIAGNOSIS — R482 Apraxia: Secondary | ICD-10-CM

## 2021-07-21 DIAGNOSIS — E119 Type 2 diabetes mellitus without complications: Secondary | ICD-10-CM

## 2021-07-22 NOTE — Therapy (Signed)
[facility] [address] [place], [place], [address] Phone: [phone]   Fax:  [phone]  Speech Language Pathology Treatment  Patient Details  Name: Amy [name] MRN: [id number] Date of Birth: 10/06/1956 Referring Provider (SLP): Martyn Malay, MD   Encounter Date: 07/21/2021   End of Session - 07/21/21 1442     Visit Number 5    Number of Visits 25    Date for SLP Re-Evaluation 09/29/21    Authorization Type Medicare/Medicaid    SLP Start Time 1446    SLP Stop Time  6759    SLP Time Calculation (min) 44 min    Activity Tolerance Patient tolerated treatment well             Past Medical History:  Diagnosis Date   Allergy    Anxiety    Brain injury    HX OF TRAUMATIC BRAIN INJURY   Constipation    RARE PER PT   Diabetes mellitus    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   Subjective Assessment - 07/22/21 0748     Subjective pt wrote children's birthdays    Patient is accompained by: Family member    Currently in Pain? No/denies                   ADULT SLP TREATMENT - 07/21/21 1441       General Information   Behavior/Cognition Alert;Cooperative;Pleasant mood;Requires cueing      Treatment Provided   Treatment provided Cognitive-Linquistic      Cognitive-Linquistic Treatment   Treatment focused on Aphasia;Apraxia    Skilled Treatment Pt's family member reported reduced comprehension observed at recent MD appointment as pt unable to follow directions for  eye exam. SLP educated pt and family member that reduced comprehension exhibited during formal assessment and in [place] trials thus far. SLP provided education re: aphasia and apraxia impacting comprehension and motor movements. SLP further assessed comprehension of 1-step instructions with ~80% accuracy exhibited. Some reduced comprehension and limb apraxia exhibited. SLP trialed common phrases on Lingraphica device, with increased difficulty for written words with abstract photos analyzed today. SLP requested family provided usual pt questions/statements at home to target in [place] sessions.      Assessment / Recommendations / Plan   Plan Continue with current plan of care      Progression Toward Goals   Progression toward goals Progressing toward goals              SLP Education - 07/22/21 0801     Education Details comprehension ed, HEP    Person(s) Educated Patient;Caregiver(s)    Methods Explanation;Demonstration;Handout    Comprehension Verbalized understanding;Returned demonstration;Need further instruction              SLP Short Term Goals - 07/21/21 1442       SLP SHORT TERM GOAL #1   Title Pt will  ID and select correct AAC icon with 80% accuracy given choice of 6 over 2 sessions    Baseline 07-17-21    Time 4    Period Weeks    Status On-going    Target Date 08/04/21      SLP SHORT TERM GOAL #2   Title Pt will use multimodal communication as able to augment verbal expression for communication of wants/needs given occasional min A over 2 sessions    Time 4    Period Weeks    Status On-going    Target Date 08/04/21      SLP SHORT TERM GOAL #3   Title Pt will use external communication support for 4/5 opportunities to augment verbal expression given occasional min A over 2 sessions    Time 4    Period Weeks    Status On-going    Target Date 08/04/21      SLP SHORT TERM GOAL #4   Title SLP will assess pt abilty to write/type for multimodal communciation in  first few ST sessions    Status Achieved    Target Date 08/04/21      SLP SHORT TERM GOAL #5   Title Pt will participate in clinical bedside swallow evaluation as needed if family reports s/sx of dysphagia    Time 4    Period Weeks    Status On-going    Target Date 08/04/21              SLP Long Term Goals - 07/21/21 1442       SLP LONG TERM GOAL #1   Title Pt will spontaneously use augmentative communication support for 4/5 opportunities to augment verbal expression over 2 sessions    Time 8    Period Weeks    Status On-going    Target Date 09/01/21      SLP LONG TERM GOAL #2   Title Pt will effectively communicate wants/needs/biographical information via augmentative comm system with 90% accuracy given rare min A over 2 sessions    Time 12    Period Weeks    Status On-going    Target Date 09/29/21      SLP LONG TERM GOAL #3   Title Pt/caregiver will indicate less frustration with use of augmentative communication system by last ST session    Time 12    Period Weeks    Status On-going    Target Date 09/29/21              Plan - 07/21/21 1442     Clinical Impression Statement Amy [name] was referred for OPST intervention secondary to aphasia from stroke in 2014. Pt is Spanish speaking and was accompanied by family member and interpreter this session. SLP recommends use of communication supports/aid to augment verbal expression due to severity of aphasia/apraxia. SLP targeted basic comprehension of 1-step commands, with inconsistent exhibited. Education provided to patient and caregiver re: impact of aphasia and apraxia on comprehension and functional tasks to target at home. SLP continued trials of Lingraphica this session, with inconsistent noted for common phrases due to abstract photos and poor reading comprehension. Due to time constraints, swallow function was not discussed but previous medical documentation revealed swallowing may needed to be addressed during ST  intervention. SLP recommends skilled ST intervention to address communication to optimize pt's ability to communicate her wants/needs/etc more effectively, and possibly dysphagia to maximize safety during PO intake.    Speech Therapy Frequency 2x / week    Duration 12  weeks   scheduled 12 weeks to account for scheduling and missed visits; anticiate d/c after 8 weeks after communication system established   Treatment/Interventions Patient/family education;Functional tasks;Multimodal communcation approach;Language facilitation;Compensatory techniques;Internal/external aids;SLP instruction and feedback    Potential to Achieve Goals Fair    Potential Considerations Severity of impairments;Other (comment)   time since onset   Consulted and Agree with Plan of Care Patient;Family member/caregiver             Patient will benefit from skilled therapeutic intervention in order to improve the following deficits and impairments:   Aphasia  Verbal apraxia    Problem List Patient Active Problem List   Diagnosis Date Noted   Dizziness 09/19/2020   Urinary frequency 09/19/2020   Encounter for counseling regarding advance directives 10/02/2019   Healthcare maintenance 08/05/2019   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) ([place]) 09/19/2015   Right facial numbness 02/09/2015   Anxiety state 10/28/2014   Language barrier to communication 10/28/2014   Cephalalgia    Change in vision 03/02/2014   Seizures ([place]) 02/24/2014   Hemorrhagic stroke ([place]) 02/16/2014   Essential hypertension 02/16/2014   DM type 2 (diabetes mellitus, type 2) ([place]) 02/16/2014   Aphasia as late effect of stroke 02/16/2014   Paralysis ([place]) 02/16/2014   Hyperlipidemia 02/16/2014    Alinda Deem, MA CCC-SLP 07/22/2021, 8:03 AM  [facility] [address] [place], [place], [address] Phone: [phone]   Fax:  [phone]   Name: Amy [name] MRN: [id number] Date of Birth: 05/10/1956

## 2021-07-25 ENCOUNTER — Other Ambulatory Visit: Payer: Self-pay

## 2021-07-25 ENCOUNTER — Ambulatory Visit: Payer: Medicare Other | Admitting: Occupational Therapy

## 2021-07-25 DIAGNOSIS — I69351 Hemiplegia and hemiparesis following cerebral infarction affecting right dominant side: Secondary | ICD-10-CM

## 2021-07-25 DIAGNOSIS — R4701 Aphasia: Secondary | ICD-10-CM | POA: Diagnosis not present

## 2021-07-25 DIAGNOSIS — M6281 Muscle weakness (generalized): Secondary | ICD-10-CM

## 2021-07-25 DIAGNOSIS — R41840 Attention and concentration deficit: Secondary | ICD-10-CM

## 2021-07-25 DIAGNOSIS — R278 Other lack of coordination: Secondary | ICD-10-CM

## 2021-07-25 NOTE — Therapy (Signed)
[facility] [address] [place], [place], [address] Phone: [phone]   Fax:  [phone]  Occupational Therapy Treatment  Patient Details  Name: Amy [name] MRN: [id number] Date of Birth: [birth year] Referring Provider (OT): Dr. Owens Shark   Encounter Date: 07/25/2021   OT End of Session - 07/25/21 1146     Visit Number 5    Number of Visits 25    Date for OT Re-Evaluation 09/27/21    Authorization Type Medicare/ Medicaid    Authorization Time Period POC written for 12 weeks may d/c after 8 dependent on progress.    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   Subjective Assessment - 07/25/21 1145     Subjective  Via interpreter, neoprene splint isn't really helping    Patient is accompanied by: Family member;Interpreter    Pertinent History 65yo female with a history of hypertension, T2DM, hemorrhagic stroke (2015) with residual R sided deficit, seizure, anxiety, aphasia, hyperlipidemia, hx of TBI    Limitations h/o  seizure 7 years ago    Patient Stated Goals be more independent    Currently in Pain? No/denies             Fabricated and fitted CMC splint for better fit as neoprene is not helping as much as anticipated, and might be too heavy. However, emphasized neither splint will give her movement, but will place thumb in better functional position in prep for grasping.  Reviewed wear and care and issued splint.   Practiced grasp and release of block and narrow cone with mod assist/support at wrist and assist for tenodesis at wrist to grasp/release.  Worked on wrist extension in gravity elim plane, place and hold against gravity but pt unable to achieve. However noted trace wrist ext when supported at midrange.   BUE AA/ROM in low range reaching to floor holding plastic dowel (boomwhacker) w/ cues                     OT Education - 07/25/21 1149     Education Details splint wear and care    Person(s) Educated Patient;Child(ren)   and daughter   Methods Explanation;Demonstration   via interpreter  Comprehension Verbalized understanding              OT Short Term Goals - 07/11/21 1214       OT SHORT TERM GOAL #1   Title I with HEP    Time 4    Period Weeks    Status On-going    Target Date 08/02/21      OT SHORT TERM GOAL #2   Title Pt will perform bathing with supervision    Time 4    Period Weeks    Status New      OT SHORT TERM GOAL #3   Title Pt will use RUE as a stabilizer/ gross A at least 25% of the time.    Time 4    Period Weeks    Status New      OT SHORT TERM GOAL #4   Title Pt will demonstrate ability to grasp/ relase a 2 inch block 2/5 trials.    Time 4    Period Weeks    Status New      OT SHORT TERM GOAL #5   Title Pt will consistently perform light home management with no more than min A.    Time 4    Period Weeks    Status New               OT Long Term Goals - 07/05/21 1136       OT LONG TERM GOAL #1   Title I with  updated HEP.    Time 12    Period Weeks    Status New    Target Date 09/27/21      OT LONG TERM GOAL #2   Title Pt will perform all basic ADLS modified independently    Time 12    Period Weeks    Status New      OT LONG TERM GOAL #3   Title Pt will perform basic home management modified independently    Time 12    Period Weeks    Status New      OT LONG TERM GOAL #4   Title Pt will grasp/ release a 1 inch block 4/5 trials with RUE    Time 12    Period Weeks    Status New      OT LONG TERM GOAL #5   Title Pt will use RUE as a stabilizer/ gross A at least 40% of the time for ADLs/IADLs.    Time 12    Period Weeks    Status New      Long Term Additional Goals   Additional Long Term Goals Yes      OT LONG TERM GOAL #6   Title Pt will demonstrate A/ROM shoulder flexion of 120* in prep for functional reach    Time 12    Period Weeks    Status New                   Plan - 07/25/21 1146     Clinical Impression Statement Pt is progressing towards goals. Pt will benefit from additional functional use of her RUE while wearing splint to avoid shoulder compensation.    OT Occupational Profile and History Detailed Assessment- Review of Records and additional review of physical, cognitive, psychosocial history related to current functional performance    Occupational performance deficits (Please refer to evaluation for details): ADL's;IADL's;Leisure;Play;Social Participation    Body Structure / Function / Physical Skills ADL;Endurance;UE functional use;Balance;Pain;Flexibility;FMC;ROM;GMC;Coordination;Decreased knowledge  of precautions;Sensation;Decreased knowledge of use of DME;IADL;Dexterity;Strength;Tone;Mobility    Cognitive Skills Attention;Problem Solve;Safety Awareness;Thought    Rehab Potential Good    Clinical Decision Making Several treatment options, min-mod task modification necessary   increased time, interpreter, demonstration due to aphasia   Comorbidities  Affecting Occupational Performance: May have comorbidities impacting occupational performance   hypertension, T2DM, hemorrhagic stroke (2015) with residual R sided deficit, seizure, anxiety, aphasia, hyperlipidemia. Dizziness   Modification or Assistance to Complete Evaluation  Min-Moderate modification of tasks or assist with assess necessary to complete eval   aphasia, inattention, increased time, lenght of time since onset   OT Frequency 2x / week   plus eval   OT Duration 12 weeks    OT Treatment/Interventions Self-care/ADL training;Ultrasound;Visual/perceptual remediation/compensation;Patient/family education;DME and/or AE instruction;Paraffin;Passive range of motion;Balance training;Fluidtherapy;Cryotherapy;Splinting;Moist Heat;Therapeutic exercise;Manual Therapy;Therapeutic activities;Cognitive remediation/compensation;Electrical Stimulation;Neuromuscular education    Plan splint check ; modified wt bearing, address any A/E needs    Consulted and Agree with Plan of Care Patient;Family member/caregiver   interpreter            Patient will benefit from skilled therapeutic intervention in order to improve the following deficits and impairments:   Body Structure / Function / Physical Skills: ADL, Endurance, UE functional use, Balance, Pain, Flexibility, FMC, ROM, GMC, Coordination, Decreased knowledge of precautions, Sensation, Decreased knowledge of use of DME, IADL, Dexterity, Strength, Tone, Mobility Cognitive Skills: Attention, Problem Solve, Safety Awareness, Thought     Visit Diagnosis: Hemiplegia and hemiparesis following cerebral infarction affecting right dominant side (HCC)  Other lack of coordination  Muscle weakness (generalized)  Attention and concentration deficit    Problem List Patient Active Problem List   Diagnosis Date Noted   Dizziness 09/19/2020   Urinary frequency 09/19/2020   Encounter for counseling regarding advance directives 10/02/2019   Healthcare  maintenance 08/05/2019   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) 02/16/2014   Aphasia as late effect of stroke 02/16/2014   Paralysis ([place]) 02/16/2014   Hyperlipidemia 02/16/2014    Carey Bullocks, OTR/L 07/25/2021, 11:50 AM  [facility] [address] [place], [place], [address] Phone: [phone]   Fax:  [phone]  Name: Amy [name] MRN: [id number] Date of Birth: 03/06/1956

## 2021-07-26 ENCOUNTER — Ambulatory Visit: Payer: Medicare Other | Admitting: Occupational Therapy

## 2021-07-26 ENCOUNTER — Ambulatory Visit: Payer: Medicare Other

## 2021-07-26 DIAGNOSIS — I69351 Hemiplegia and hemiparesis following cerebral infarction affecting right dominant side: Secondary | ICD-10-CM

## 2021-07-26 DIAGNOSIS — R482 Apraxia: Secondary | ICD-10-CM

## 2021-07-26 DIAGNOSIS — R41840 Attention and concentration deficit: Secondary | ICD-10-CM

## 2021-07-26 DIAGNOSIS — M6281 Muscle weakness (generalized): Secondary | ICD-10-CM

## 2021-07-26 DIAGNOSIS — R4701 Aphasia: Secondary | ICD-10-CM

## 2021-07-26 NOTE — Therapy (Signed)
[facility] [address] [place], [place], [address] Phone: [phone]   Fax:  [phone]  Occupational Therapy Treatment  Patient Details  Name: Amy [name] MRN: [id number] Date of Birth: Aug 22, 1956 Referring Provider (OT): Dr. Owens Shark   Encounter Date: 07/26/2021   OT End of Session - 07/26/21 0937     Visit Number 6    Number of Visits 25    Date for OT Re-Evaluation 09/27/21    Authorization Type Medicare/ Medicaid    Authorization Time Period POC written for 12 weeks may d/c after 8 dependent on progress.    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   Subjective Assessment - 07/26/21 0851     Subjective  splint doing well    Patient is accompanied by: Family member;Interpreter    Pertinent History 65yo female with a history of hypertension, T2DM, hemorrhagic stroke (2015) with residual R sided deficit, seizure, anxiety, aphasia, hyperlipidemia, hx of TBI    Limitations h/o seizure 7 years ago    Patient Stated  Goals be more independent    Currently in Pain? No/denies             Pt reports new splint is going well w/ no problems via interpreter.   BUE AA/ROM holding plastic dowel and reaching towards floor and back to lap. AA/ROM RUE w/ UE Ranger in gravity elim plane and then against gravity with simple cueing via interpreter and min facilitation d/t aphasia and ? Apraxia  Modified wt bearing over RUE for body on arm movements while cross reaching and ipsilateral reaching LUE  Pt/family shown A/E: rocker knife and shoe buttons and therapist demo each. Pt returned demo of rocker knife. Provided handouts and instructed where/how to purchase via interpreter.                        OT Short Term Goals - 07/11/21 1214       OT SHORT TERM GOAL #1   Title I with HEP    Time 4    Period Weeks    Status On-going    Target Date 08/02/21      OT SHORT TERM GOAL #2  Title Pt will perform bathing with supervision    Time 4    Period Weeks    Status New      OT SHORT TERM GOAL #3   Title Pt will use RUE as a stabilizer/ gross A at least 25% of the time.    Time 4    Period Weeks    Status New      OT SHORT TERM GOAL #4   Title Pt will demonstrate ability to grasp/ relase a 2 inch block 2/5 trials.    Time 4    Period Weeks    Status New      OT SHORT TERM GOAL #5   Title Pt will consistently perform light home management with no more than min A.    Time 4    Period Weeks    Status New               OT Long Term Goals - 07/05/21 1136       OT LONG TERM GOAL #1   Title I with updated HEP.    Time 12    Period Weeks    Status New    Target Date 09/27/21      OT LONG TERM GOAL #2   Title Pt will perform all basic ADLS modified independently    Time 12    Period Weeks    Status New      OT LONG TERM GOAL #3   Title Pt will perform basic home management modified independently    Time 12    Period Weeks    Status New      OT LONG TERM GOAL #4    Title Pt will grasp/ release a 1 inch block 4/5 trials with RUE    Time 12    Period Weeks    Status New      OT LONG TERM GOAL #5   Title Pt will use RUE as a stabilizer/ gross A at least 40% of the time for ADLs/IADLs.    Time 12    Period Weeks    Status New      Long Term Additional Goals   Additional Long Term Goals Yes      OT LONG TERM GOAL #6   Title Pt will demonstrate A/ROM shoulder flexion of 120* in prep for functional reach    Time 12    Period Weeks    Status New                   Plan - 07/26/21 7494     Clinical Impression Statement Pt slowly progressing towards goals. Pt/family w/ increased awareness into preventing sh compensations and potential A/E needs.    OT Occupational Profile and History Detailed Assessment- Review of Records and additional review of physical, cognitive, psychosocial history related to current functional performance    Occupational performance deficits (Please refer to evaluation for details): ADL's;IADL's;Leisure;Play;Social Participation    Body Structure / Function / Physical Skills ADL;Endurance;UE functional use;Balance;Pain;Flexibility;FMC;ROM;GMC;Coordination;Decreased knowledge of precautions;Sensation;Decreased knowledge of use of DME;IADL;Dexterity;Strength;Tone;Mobility    Cognitive Skills Attention;Problem Solve;Safety Awareness;Thought    Rehab Potential Good    Clinical Decision Making Several treatment options, min-mod task modification necessary   increased time, interpreter, demonstration due to aphasia   Comorbidities Affecting Occupational Performance: May have comorbidities impacting occupational performance   hypertension, T2DM, hemorrhagic stroke (2015) with residual R sided deficit, seizure, anxiety, aphasia, hyperlipidemia. Dizziness   Modification or Assistance to  Complete Evaluation  Min-Moderate modification of tasks or assist with assess necessary to complete eval   aphasia, inattention, increased time,  lenght of time since onset   OT Frequency 2x / week   plus eval   OT Duration 12 weeks    OT Treatment/Interventions Self-care/ADL training;Ultrasound;Visual/perceptual remediation/compensation;Patient/family education;DME and/or AE instruction;Paraffin;Passive range of motion;Balance training;Fluidtherapy;Cryotherapy;Splinting;Moist Heat;Therapeutic exercise;Manual Therapy;Therapeutic activities;Cognitive remediation/compensation;Electrical Stimulation;Neuromuscular education    Plan continue modified wt bearing, AA/ROM and functional use as able RUE    Consulted and Agree with Plan of Care Patient;Family member/caregiver   interpreter            Patient will benefit from skilled therapeutic intervention in order to improve the following deficits and impairments:   Body Structure / Function / Physical Skills: ADL, Endurance, UE functional use, Balance, Pain, Flexibility, FMC, ROM, GMC, Coordination, Decreased knowledge of precautions, Sensation, Decreased knowledge of use of DME, IADL, Dexterity, Strength, Tone, Mobility Cognitive Skills: Attention, Problem Solve, Safety Awareness, Thought     Visit Diagnosis: Hemiplegia and hemiparesis following cerebral infarction affecting right dominant side (HCC)  Muscle weakness (generalized)  Attention and concentration deficit    Problem List Patient Active Problem List   Diagnosis Date Noted   Dizziness 09/19/2020   Urinary frequency 09/19/2020   Encounter for counseling regarding advance directives 10/02/2019   Healthcare maintenance 08/05/2019   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 [place], [place], [address] Phone: [phone]   Fax:  [phone]  Name: Amy [name] MRN: [id number] Date of Birth: [birth year]

## 2021-07-26 NOTE — Therapy (Signed)
[facility] [address] [place], [place], [address] Phone: [phone]   Fax:  [phone]  Speech Language Pathology Treatment  Patient Details  Name: Amy [name] MRN: [id number] Date of Birth: Aug 31, 1956 Referring Provider (SLP): Martyn Malay, MD   Encounter Date: 07/26/2021   End of Session - 07/26/21 0956     Visit Number 6    Number of Visits 25    Date for SLP Re-Evaluation 09/29/21    Authorization Type Medicare/Medicaid    SLP Start Time 1015    SLP Stop Time  1103    SLP Time Calculation (min) 48 min    Activity Tolerance Patient tolerated treatment well             Past Medical History:  Diagnosis Date   Allergy    Anxiety    Brain injury    HX OF TRAUMATIC BRAIN INJURY   Constipation    RARE PER PT   Diabetes mellitus    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   Subjective Assessment - 07/26/21 1028     Subjective nodded head    Patient is accompained by: Family member    Currently in Pain? No/denies                   ADULT SLP TREATMENT - 07/26/21 0956       General Information   Behavior/Cognition Alert;Cooperative;Pleasant mood;Requires cueing      Treatment Provided   Treatment provided Cognitive-Linquistic      Cognitive-Linquistic Treatment   Treatment focused on Aphasia;Apraxia    Skilled Treatment Pt continues to complete writing HEP for family members names and birthdays. SLP completed additional trials of Lingraphica device, in  which pt exhibits reduced comprehension of abstract icons (good morning/evening) versus concrete icons (foods, stores). Pt's husband present today and report pt is exhibiting difficulty with recall. SLP suspects this may be related in part to comprehension. SLP cued mental repetition of targeted icons, which did appear to increase accuracy. SLP trialed various gestures, in which limb apraxia seemingly impacted by peformance despite modeling.      Assessment / Recommendations / Plan   Plan Continue with current plan of care      Progression Toward Goals   Progression toward goals Progressing toward goals              SLP Education - 07/26/21 1132     Education Details memory compensations, comprehension ed, HEP    Person(s) Educated Patient;Spouse    Methods Explanation;Demonstration;Handout    Comprehension Verbalized understanding;Returned demonstration;Need further instruction              SLP Short Term Goals - 07/26/21 0957       SLP SHORT TERM GOAL #1   Title Pt will ID and select correct AAC icon with 80% accuracy given choice of 6 over 2 sessions  Baseline 07-17-21    Time 4    Period Weeks    Status On-going    Target Date 08/04/21      SLP SHORT TERM GOAL #2   Title Pt will use multimodal communication as able to augment verbal expression for communication of wants/needs given occasional min A over 2 sessions    Time 4    Period Weeks    Status On-going    Target Date 08/04/21      SLP SHORT TERM GOAL #3   Title Pt will use external communication support for 4/5 opportunities to augment verbal expression given occasional min A over 2 sessions    Time 4    Period Weeks    Status On-going    Target Date 08/04/21      SLP SHORT TERM GOAL #4   Title SLP will assess pt abilty to write/type for multimodal communciation in first few ST sessions    Status Achieved    Target Date 08/04/21      SLP SHORT TERM GOAL #5   Title Pt will participate in clinical  bedside swallow evaluation as needed if family reports s/sx of dysphagia    Time 4    Period Weeks    Status On-going    Target Date 08/04/21              SLP Long Term Goals - 07/26/21 0957       SLP LONG TERM GOAL #1   Title Pt will spontaneously use augmentative communication support for 4/5 opportunities to augment verbal expression over 2 sessions    Time 8    Period Weeks    Status On-going    Target Date 09/01/21      SLP LONG TERM GOAL #2   Title Pt will effectively communicate wants/needs/biographical information via augmentative comm system with 90% accuracy given rare min A over 2 sessions    Time 12    Period Weeks    Status On-going    Target Date 09/29/21      SLP LONG TERM GOAL #3   Title Pt/caregiver will indicate less frustration with use of augmentative communication system by last ST session    Time 12    Period Weeks    Status On-going    Target Date 09/29/21              Plan - 07/26/21 0957     Clinical Impression Statement Amy [name] was referred for OPST intervention secondary to aphasia from stroke in 2014. Pt is Spanish speaking and was accompanied by family member and interpreter this session. SLP recommends use of communication supports/aid to augment verbal expression due to severity of aphasia/apraxia. SLP targeted basic comprehension via Lingraphica device, with inconsistent accuracy noted for abstract concepts versus concrete. Education provided to patient and spouse re: impact of aphasia and apraxia on comprehension and functional tasks to target at home. Due to time constraints, swallow function was not discussed but previous medical documentation revealed swallowing may needed to be addressed during ST intervention. SLP recommends skilled ST intervention to address communication to optimize pt's ability to communicate her wants/needs/etc more effectively, and possibly dysphagia to maximize safety during PO intake.    Speech Therapy Frequency  2x / week    Duration 12 weeks   scheduled 12 weeks to account for scheduling and missed visits; anticiate d/c after 8 weeks after communication system established   Treatment/Interventions Patient/family education;Functional tasks;Multimodal communcation approach;Language facilitation;Compensatory techniques;Internal/external aids;SLP instruction  and feedback    Potential to Achieve Goals Fair    Potential Considerations Severity of impairments;Other (comment)   time since onset   Consulted and Agree with Plan of Care Patient;Family member/caregiver             Patient will benefit from skilled therapeutic intervention in order to improve the following deficits and impairments:   Aphasia  Verbal apraxia    Problem List Patient Active Problem List   Diagnosis Date Noted   Dizziness 09/19/2020   Urinary frequency 09/19/2020   Encounter for counseling regarding advance directives 10/02/2019   Healthcare maintenance 08/05/2019   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) ([place]) 09/19/2015   Right facial numbness 02/09/2015   Anxiety state 10/28/2014   Language barrier to communication 10/28/2014   Cephalalgia    Change in vision 03/02/2014   Seizures ([place]) 02/24/2014   Hemorrhagic stroke ([place]) 02/16/2014   Essential hypertension 02/16/2014   DM type 2 (diabetes mellitus, type 2) ([place]) 02/16/2014   Aphasia as late effect of stroke 02/16/2014   Paralysis ([place]) 02/16/2014   Hyperlipidemia 02/16/2014    Alinda Deem, MA CCC-SLP 07/26/2021, 11:35 AM  [facility] [address] [place], [place], [address] Phone: [phone]   Fax:  [phone]   Name: Amy [name] MRN: [id number] Date of Birth: 1956/08/06

## 2021-07-28 ENCOUNTER — Ambulatory Visit: Payer: Medicare Other

## 2021-07-28 ENCOUNTER — Other Ambulatory Visit: Payer: Self-pay

## 2021-07-28 DIAGNOSIS — R4701 Aphasia: Secondary | ICD-10-CM

## 2021-07-28 DIAGNOSIS — R482 Apraxia: Secondary | ICD-10-CM

## 2021-07-28 NOTE — Therapy (Signed)
[facility] [address] [place], [place], [address] Phone: [phone]   Fax:  [phone]  Speech Language Pathology Treatment  Patient Details  Name: Amy [name] MRN: [id number] Date of Birth: Mar 04, 1956 Referring Provider (SLP): Martyn Malay, MD   Encounter Date: 07/28/2021   End of Session - 07/28/21 1550     Visit Number 7    Number of Visits 25    Date for SLP Re-Evaluation 09/29/21    Authorization Type Medicare/Medicaid    SLP Start Time 47    SLP Stop Time  0737    SLP Time Calculation (min) 45 min    Activity Tolerance Patient tolerated treatment well             Past Medical History:  Diagnosis Date   Allergy    Anxiety    Brain injury    HX OF TRAUMATIC BRAIN INJURY   Constipation    RARE PER PT   Diabetes mellitus    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   Subjective Assessment - 07/28/21 1547     Subjective smiled    Patient is accompained by: Family member   husband and interpreter   Currently in Pain? No/denies                   ADULT SLP TREATMENT - 07/28/21 1317       General Information   Behavior/Cognition Alert;Cooperative;Pleasant mood;Requires cueing      Treatment Provided   Treatment provided Cognitive-Linquistic      Cognitive-Linquistic Treatment   Treatment focused on Aphasia;Apraxia    Skilled Treatment SLP provided trial Lingraphica device this session. Device and equipment, handouts, and education provided to patient  and husband. SLP demonstrated how to utilize and modify device to personalize and customize icons based on preferences. Usual min cues required to initiate tapping icons and scrolling. Both husband and patient indicated understanding of 30 day Lingraphica device, included equipment, and base knowledge to begin using device at home over weekend.      Assessment / Recommendations / Plan   Plan Continue with current plan of care      Progression Toward Goals   Progression toward goals Progressing toward goals              SLP Education - 07/28/21 1550     Education Details begin Lingraphica trial today, basic modifications    Person(s) Educated Patient;Spouse    Methods Explanation;Demonstration;Handout    Comprehension Verbalized understanding;Returned demonstration;Need further instruction              SLP Short Term Goals - 07/28/21 1553       SLP SHORT TERM GOAL #1   Title Pt will ID and select correct AAC icon with 80% accuracy given choice of 6 over 2 sessions    Baseline 07-17-21    Time 4  Period Weeks    Status On-going    Target Date 08/04/21      SLP SHORT TERM GOAL #2   Title Pt will use multimodal communication as able to augment verbal expression for communication of wants/needs given occasional min A over 2 sessions    Baseline 07-28-21    Time 4    Period Weeks    Status On-going    Target Date 08/04/21      SLP SHORT TERM GOAL #3   Title Pt will use external communication support for 4/5 opportunities to augment verbal expression given occasional min A over 2 sessions    Baseline 07-28-21    Time 4    Period Weeks    Status On-going    Target Date 08/04/21      SLP SHORT TERM GOAL #4   Title SLP will assess pt abilty to write/type for multimodal communciation in first few ST sessions    Status Achieved    Target Date 08/04/21      SLP SHORT TERM GOAL #5   Title Pt will participate in clinical bedside swallow evaluation as needed if family  reports s/sx of dysphagia    Time 4    Period Weeks    Status On-going    Target Date 08/04/21              SLP Long Term Goals - 07/28/21 1554       SLP LONG TERM GOAL #1   Title Pt will spontaneously use augmentative communication support for 4/5 opportunities to augment verbal expression over 2 sessions    Time 8    Period Weeks    Status On-going    Target Date 09/01/21      SLP LONG TERM GOAL #2   Title Pt will effectively communicate wants/needs/biographical information via augmentative comm system with 90% accuracy given rare min A over 2 sessions    Time 12    Period Weeks    Status On-going    Target Date 09/29/21      SLP LONG TERM GOAL #3   Title Pt/caregiver will indicate less frustration with use of augmentative communication system by last ST session    Time 12    Period Weeks    Status On-going    Target Date 09/29/21              Plan - 07/28/21 1551     Clinical Impression Statement Maudry Diego was referred for OPST intervention secondary to aphasia from stroke in 2014. Pt is Spanish speaking and was accompanied by family member and interpreter this session. SLP recommends use of communication supports/aid to augment verbal expression due to severity of aphasia/apraxia. SLP targeted basic comprehension via Lingraphica device, with usual min cues to tap and scroll today on new trial device. SLP provided device, charger, and stylus this session, with handouts and basic information re: trial length and expectations, as well as basic device modifcations. Further device education and modification will be needed to increase comprehension and ability to use to fullest extent. Due to time constraints, swallow function was not discussed but previous medical documentation revealed swallowing may needed to be addressed during ST intervention. SLP recommends skilled ST intervention to address communication to optimize pt's ability to communicate her wants/needs/etc  more effectively, and possibly dysphagia to maximize safety during PO intake.    Speech Therapy Frequency 2x / week    Duration 12 weeks   scheduled 12 weeks to account  for scheduling and missed visits; anticiate d/c after 8 weeks after communication system established   Treatment/Interventions Patient/family education;Functional tasks;Multimodal communcation approach;Language facilitation;Compensatory techniques;Internal/external aids;SLP instruction and feedback    Potential to Achieve Goals Fair    Potential Considerations Severity of impairments;Other (comment)   time since onset   Consulted and Agree with Plan of Care Patient;Family member/caregiver             Patient will benefit from skilled therapeutic intervention in order to improve the following deficits and impairments:   Aphasia  Verbal apraxia    Problem List Patient Active Problem List   Diagnosis Date Noted   Dizziness 09/19/2020   Urinary frequency 09/19/2020   Encounter for counseling regarding advance directives 10/02/2019   Healthcare maintenance 08/05/2019   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) ([place]) 09/19/2015   Right facial numbness 02/09/2015   Anxiety state 10/28/2014   Language barrier to communication 10/28/2014   Cephalalgia    Change in vision 03/02/2014   Seizures ([place]) 02/24/2014   Hemorrhagic stroke ([place]) 02/16/2014   Essential hypertension 02/16/2014   DM type 2 (diabetes mellitus, type 2) ([place]) 02/16/2014   Aphasia as late effect of stroke 02/16/2014   Paralysis ([place]) 02/16/2014   Hyperlipidemia 02/16/2014    Alinda Deem, MA CCC-SLP 07/28/2021, 3:56 PM  [facility] [address] [place], [place], [address] Phone: [phone]   Fax:  [phone]   Name: Tyese Finken MRN: [id number] Date of Birth: 12-09-1955

## 2021-08-01 ENCOUNTER — Other Ambulatory Visit: Payer: Self-pay

## 2021-08-01 ENCOUNTER — Ambulatory Visit: Payer: Medicare Other

## 2021-08-01 ENCOUNTER — Ambulatory Visit: Payer: Medicare Other | Admitting: Occupational Therapy

## 2021-08-01 DIAGNOSIS — R482 Apraxia: Secondary | ICD-10-CM

## 2021-08-01 DIAGNOSIS — I69351 Hemiplegia and hemiparesis following cerebral infarction affecting right dominant side: Secondary | ICD-10-CM

## 2021-08-01 DIAGNOSIS — R4701 Aphasia: Secondary | ICD-10-CM | POA: Diagnosis not present

## 2021-08-01 DIAGNOSIS — R278 Other lack of coordination: Secondary | ICD-10-CM

## 2021-08-01 DIAGNOSIS — R41840 Attention and concentration deficit: Secondary | ICD-10-CM

## 2021-08-01 DIAGNOSIS — M6281 Muscle weakness (generalized): Secondary | ICD-10-CM

## 2021-08-01 NOTE — Therapy (Signed)
[facility] [address] [place], [place], [address] Phone: [phone]   Fax:  [phone]  Speech Language Pathology Treatment  Patient Details  Name: Amy [name] MRN: [id number] Date of Birth: 10-19-1955 Referring Provider (SLP): Martyn Malay, MD   Encounter Date: 08/01/2021   End of Session - 08/01/21 1229     Visit Number 8    Number of Visits 25    Date for SLP Re-Evaluation 09/29/21    Authorization Type Medicare/Medicaid    SLP Start Time 64    SLP Stop Time  13    SLP Time Calculation (min) 45 min    Activity Tolerance Patient tolerated treatment well             Past Medical History:  Diagnosis Date   Allergy    Anxiety    Brain injury    HX OF TRAUMATIC BRAIN INJURY   Constipation    RARE PER PT   Diabetes mellitus    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   Subjective Assessment - 08/01/21 1230     Subjective waved    Patient is accompained by: Family member   husband and interpreter   Currently in Pain? No/denies                   ADULT SLP TREATMENT - 08/01/21 1229       General Information   Behavior/Cognition Alert;Cooperative;Pleasant mood;Requires cueing      Treatment Provided   Treatment provided Cognitive-Linquistic      Cognitive-Linquistic Treatment   Treatment focused on Aphasia;Apraxia    Skilled Treatment Pt returned with trial Lingraphica and reportedly utilized over weekend. No modifications implemented since last ST  session. SLP reviewed with patient and husband on how to modify device, with SLP demonstrating how to include family members. Usual verbal and visual cues required to complete step-by-step sequencing. SLP discussed swallow function per previous MD note indicating possible impairment. Pt and husband indicates rare occurance of difficulty swallowing. No PNA indicated. Husband reported throat surgery in [place] ~7 years ago; however, no medical records were able to be obtained. SLP deferred STG targeting swallow per family reporting no concern for swallow function at this time.      Assessment / Recommendations / Plan   Plan Continue with current plan of care      Progression Toward Goals   Progression toward goals Progressing toward goals              SLP Education - 08/01/21 1500     Education Details how to modify device    Person(s) Educated Patient;Spouse    Methods Explanation;Demonstration;Handout;Verbal cues    Comprehension Verbalized understanding;Returned demonstration;Need further instruction;Verbal cues required              SLP Short Term Goals - 08/01/21 1230       SLP SHORT TERM  GOAL #1   Title Pt will ID and select correct AAC icon with 80% accuracy given choice of 6 over 2 sessions    Baseline 07-17-21    Time 4    Period Weeks    Status On-going    Target Date 08/04/21      SLP SHORT TERM GOAL #2   Title Pt will use multimodal communication as able to augment verbal expression for communication of wants/needs given occasional min A over 2 sessions    Baseline 07-28-21    Time 4    Period Weeks    Status On-going    Target Date 08/04/21      SLP SHORT TERM GOAL #3   Title Pt will use external communication support for 4/5 opportunities to augment verbal expression given occasional min A over 2 sessions    Baseline 07-28-21    Time 4    Period Weeks    Status On-going    Target Date 08/04/21      SLP SHORT TERM GOAL #4   Title SLP will assess pt  abilty to write/type for multimodal communciation in first few ST sessions    Status Achieved    Target Date 08/04/21      SLP SHORT TERM GOAL #5   Title Pt will participate in clinical bedside swallow evaluation as needed if family reports s/sx of dysphagia    Time --    Period --    Status Deferred    Target Date 08/04/21              SLP Long Term Goals - 08/01/21 1230       SLP LONG TERM GOAL #1   Title Pt will spontaneously use augmentative communication support for 4/5 opportunities to augment verbal expression over 2 sessions    Time 8    Period Weeks    Status On-going    Target Date 09/01/21      SLP LONG TERM GOAL #2   Title Pt will effectively communicate wants/needs/biographical information via augmentative comm system with 90% accuracy given rare min A over 2 sessions    Time 12    Period Weeks    Status On-going    Target Date 09/29/21      SLP LONG TERM GOAL #3   Title Pt/caregiver will indicate less frustration with use of augmentative communication system by last ST session    Time 12    Period Weeks    Status On-going    Target Date 09/29/21              Plan - 08/01/21 1230     Clinical Impression Statement Amy [name] was referred for OPST intervention secondary to aphasia from stroke in 2014. Pt is Spanish speaking and was accompanied by family member and interpreter this session. SLP educated and instructed basic comprehension and modification of trial Lingraphica device, with usual verbal and visual cues to tap and scroll today on new trial device. Further device education and modification will be needed to increase comprehension and ability to use to fullest extent. Pt and husband declined difficulty swallowing at this time. SLP recommends skilled ST intervention to address communication to optimize pt's ability to communicate her wants/needs/etc more effectively.    Speech Therapy Frequency 2x / week    Duration 12 weeks   scheduled 12 weeks  to account for scheduling and missed visits; anticiate d/c after 8 weeks after communication system established   Treatment/Interventions Patient/family education;Functional  tasks;Multimodal communcation approach;Language facilitation;Compensatory techniques;Internal/external aids;SLP instruction and feedback    Potential to Achieve Goals Fair    Potential Considerations Severity of impairments;Other (comment)   time since onset   Consulted and Agree with Plan of Care Patient;Family member/caregiver             Patient will benefit from skilled therapeutic intervention in order to improve the following deficits and impairments:   Aphasia  Verbal apraxia    Problem List Patient Active Problem List   Diagnosis Date Noted   Dizziness 09/19/2020   Urinary frequency 09/19/2020   Encounter for counseling regarding advance directives 10/02/2019   Healthcare maintenance 08/05/2019   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 [place], [place], [address] Phone: [phone]   Fax:  [phone]   Name: Amy [name] MRN: [id number] Date of Birth: [birth year]

## 2021-08-01 NOTE — Therapy (Signed)
[facility] [address] [place], [place], [address] Phone: [phone]   Fax:  [phone]  Occupational Therapy Treatment  Patient Details  Name: Amy [name] MRN: [id number] Date of Birth: 01/12/1956 Referring Provider (OT): Dr. Owens Shark   Encounter Date: 08/01/2021   OT End of Session - 08/01/21 1149     Visit Number 7    Number of Visits 25    Date for OT Re-Evaluation 09/27/21    Authorization Type Medicare/ Medicaid    Authorization Time Period POC written for 12 weeks may d/c after 8 dependent on progress.    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   Subjective Assessment - 08/01/21 1104     Subjective  splint doing well    Patient is accompanied by: Family member;Interpreter    Pertinent History 65yo female with a history of hypertension, T2DM, hemorrhagic stroke (2015) with residual R sided deficit, seizure, anxiety, aphasia, hyperlipidemia, hx of TBI    Limitations h/o seizure 7 years ago    Patient Stated  Goals be more independent    Currently in Pain? No/denies             BUE AA/ROM holding plastic dowel and reaching towards floor and back to lap. Progressed to gravity elim and then to diagonal surface against gravity (using sliding board). AA/ROM RUE w/ UE Ranger in gravity elim plane and then against gravity    Modified wt bearing over RUE for body on arm movements while cross reaching and ipsilateral reaching LUE  Practiced picking up 1" blocks w/ CMC splint on and mod assist/facilitation at wrist and fingers.   Reviewed HEP, functional use of Rt hand, and progress towards goals.                   OT Short Term Goals - 08/01/21 1149       OT SHORT TERM GOAL #1   Title I with HEP    Time 4    Period Weeks    Status Achieved    Target Date 08/02/21      OT SHORT TERM GOAL #2   Title Pt will perform bathing with supervision    Time 4  Period Weeks    Status New      OT SHORT TERM GOAL #3   Title Pt will use RUE as a stabilizer/ gross A at least 25% of the time.    Time 4    Period Weeks    Status On-going      OT SHORT TERM GOAL #4   Title Pt will demonstrate ability to grasp/ relase a 2 inch block 2/5 trials.    Time 4    Period Weeks    Status On-going   Pt requires mod assist at wrist and fingers     OT SHORT TERM GOAL #5   Title Pt will consistently perform light home management with no more than min A.    Time 4    Period Weeks    Status New               OT Long Term Goals - 07/05/21 1136       OT LONG TERM GOAL #1   Title I with updated HEP.    Time 12    Period Weeks    Status New    Target Date 09/27/21      OT LONG TERM GOAL #2   Title Pt will perform all basic ADLS modified independently    Time 12    Period Weeks    Status New      OT LONG TERM GOAL #3   Title Pt will perform basic home management modified independently    Time 12    Period Weeks    Status New      OT LONG TERM GOAL #4   Title Pt will  grasp/ release a 1 inch block 4/5 trials with RUE    Time 12    Period Weeks    Status New      OT LONG TERM GOAL #5   Title Pt will use RUE as a stabilizer/ gross A at least 40% of the time for ADLs/IADLs.    Time 12    Period Weeks    Status New      Long Term Additional Goals   Additional Long Term Goals Yes      OT LONG TERM GOAL #6   Title Pt will demonstrate A/ROM shoulder flexion of 120* in prep for functional reach    Time 12    Period Weeks    Status New                   Plan - 08/01/21 1150     Clinical Impression Statement Pt slowly progressing towards goals. Pt with increased activation RUE w/ wt bearing activity    OT Occupational Profile and History Detailed Assessment- Review of Records and additional review of physical, cognitive, psychosocial history related to current functional performance    Occupational performance deficits (Please refer to evaluation for details): ADL's;IADL's;Leisure;Play;Social Participation    Body Structure / Function / Physical Skills ADL;Endurance;UE functional use;Balance;Pain;Flexibility;FMC;ROM;GMC;Coordination;Decreased knowledge of precautions;Sensation;Decreased knowledge of use of DME;IADL;Dexterity;Strength;Tone;Mobility    Cognitive Skills Attention;Problem Solve;Safety Awareness;Thought    Rehab Potential Good    Clinical Decision Making Several treatment options, min-mod task modification necessary   increased time, interpreter, demonstration due to aphasia   Comorbidities Affecting Occupational Performance: May have comorbidities impacting occupational performance   hypertension, T2DM, hemorrhagic stroke (2015) with residual R sided deficit, seizure, anxiety, aphasia, hyperlipidemia. Dizziness   Modification or Assistance to Complete Evaluation  Min-Moderate modification of tasks or assist  with assess necessary to complete eval   aphasia, inattention, increased time, lenght of time since onset   OT Frequency 2x / week    plus eval   OT Duration 12 weeks    OT Treatment/Interventions Self-care/ADL training;Ultrasound;Visual/perceptual remediation/compensation;Patient/family education;DME and/or AE instruction;Paraffin;Passive range of motion;Balance training;Fluidtherapy;Cryotherapy;Splinting;Moist Heat;Therapeutic exercise;Manual Therapy;Therapeutic activities;Cognitive remediation/compensation;Electrical Stimulation;Neuromuscular education    Plan continue modified wt bearing, AA/ROM and functional use as able RUE, begin assessing progress towards remaining STG's    Consulted and Agree with Plan of Care Patient;Family member/caregiver   interpreter            Patient will benefit from skilled therapeutic intervention in order to improve the following deficits and impairments:   Body Structure / Function / Physical Skills: ADL, Endurance, UE functional use, Balance, Pain, Flexibility, FMC, ROM, GMC, Coordination, Decreased knowledge of precautions, Sensation, Decreased knowledge of use of DME, IADL, Dexterity, Strength, Tone, Mobility Cognitive Skills: Attention, Problem Solve, Safety Awareness, Thought     Visit Diagnosis: Hemiplegia and hemiparesis following cerebral infarction affecting right dominant side (HCC)  Muscle weakness (generalized)  Attention and concentration deficit  Other lack of coordination    Problem List Patient Active Problem List   Diagnosis Date Noted   Dizziness 09/19/2020   Urinary frequency 09/19/2020   Encounter for counseling regarding advance directives 10/02/2019   Healthcare maintenance 08/05/2019   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) ([place]) 09/19/2015   Right facial numbness 02/09/2015   Anxiety state 10/28/2014   Language barrier to communication 10/28/2014   Cephalalgia    Change in vision 03/02/2014   Seizures ([place]) 02/24/2014   Hemorrhagic stroke ([place]) 02/16/2014   Essential hypertension 02/16/2014   DM type 2 (diabetes mellitus,  type 2) ([place]) 02/16/2014   Aphasia as late effect of stroke 02/16/2014   Paralysis ([place]) 02/16/2014   Hyperlipidemia 02/16/2014    Carey Bullocks, OTR/L 08/01/2021, 11:56 AM  [facility] [address] [place], [place], [address] Phone: [phone]   Fax:  [phone]  Name: Amy [name] MRN: [id number] Date of Birth: [birth year]

## 2021-08-02 ENCOUNTER — Ambulatory Visit: Payer: Medicare Other

## 2021-08-02 ENCOUNTER — Ambulatory Visit: Payer: Medicare Other | Admitting: Occupational Therapy

## 2021-08-02 DIAGNOSIS — I69351 Hemiplegia and hemiparesis following cerebral infarction affecting right dominant side: Secondary | ICD-10-CM

## 2021-08-02 DIAGNOSIS — R41840 Attention and concentration deficit: Secondary | ICD-10-CM

## 2021-08-02 DIAGNOSIS — R4701 Aphasia: Secondary | ICD-10-CM | POA: Diagnosis not present

## 2021-08-02 DIAGNOSIS — M6281 Muscle weakness (generalized): Secondary | ICD-10-CM

## 2021-08-02 DIAGNOSIS — R278 Other lack of coordination: Secondary | ICD-10-CM

## 2021-08-02 DIAGNOSIS — R482 Apraxia: Secondary | ICD-10-CM

## 2021-08-02 NOTE — Therapy (Signed)
[facility] [address] [place], [place], [address] Phone: [phone]   Fax:  [phone]  Speech Language Pathology Treatment  Patient Details  Name: Amy [name] MRN: [id number] Date of Birth: 11-30-1955 Referring Provider (SLP): Martyn Malay, MD   Encounter Date: 08/02/2021   End of Session - 08/02/21 1002     Visit Number 9    Number of Visits 25    Date for SLP Re-Evaluation 09/29/21    Authorization Type Medicare/Medicaid    SLP Start Time 6073    SLP Stop Time  1100    SLP Time Calculation (min) 47 min    Activity Tolerance Patient tolerated treatment well             Past Medical History:  Diagnosis Date   Allergy    Anxiety    Brain injury    HX OF TRAUMATIC BRAIN INJURY   Constipation    RARE PER PT   Diabetes mellitus    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   Subjective Assessment - 08/02/21 1241     Subjective smiled and nodded    Patient is accompained by: Family member   husband and interpreter   Currently in Pain? No/denies                   ADULT SLP TREATMENT - 08/02/21 1002       General Information   Behavior/Cognition Alert;Cooperative;Pleasant mood;Requires cueing      Treatment Provided   Treatment provided Cognitive-Linquistic      Cognitive-Linquistic Treatment   Treatment focused on Aphasia;Apraxia    Skilled Treatment Pt and husband returned with trial Lingraphica device. No modifications implemented per SLP recommendation.  SLP wrote and translated step by step directions to aid family members understanding of how to operate device. Given pt's exhibited limited reading comprehension, SLP provided verbal step by step sequences to add additional family members. Consistent mod verbal and visual cues required for patient to demonstrate targeted action. Pt typed family members names with intermittent mod A for spelling. SLP demonstrated how to add/delete icons per patient preference as pt benefits from specific and limited options at this time to aid comprehension.      Assessment / Recommendations / Plan   Plan Continue with current plan of care      Progression Toward Goals   Progression toward goals Progressing toward goals              SLP Education - 08/02/21 1238     Education Details instructions how to modify device    Person(s) Educated Patient;Spouse    Methods Explanation;Demonstration;Handout    Comprehension Verbalized understanding;Returned demonstration;Need further instruction              SLP Short Term Goals - 08/02/21 1004       SLP SHORT TERM GOAL #1   Title Pt  will ID and select correct AAC icon with 80% accuracy given choice of 6 over 2 sessions    Baseline 07-17-21    Time 4    Period Weeks    Status Partially Met    Target Date 08/04/21      SLP SHORT TERM GOAL #2   Title Pt will use multimodal communication as able to augment verbal expression for communication of wants/needs given occasional min A over 2 sessions    Baseline 07-28-21, 08-02-21    Time 4    Period Weeks    Status Achieved    Target Date 08/04/21      SLP SHORT TERM GOAL #3   Title Pt will use external communication support for 4/5 opportunities to augment verbal expression given occasional min A over 2 sessions    Baseline 07-28-21    Time 4    Period Weeks    Status Partially Met    Target Date 08/04/21      SLP SHORT TERM GOAL #4   Title SLP will assess pt abilty to write/type for multimodal  communciation in first few ST sessions    Status Achieved    Target Date 08/04/21      SLP SHORT TERM GOAL #5   Title Pt will participate in clinical bedside swallow evaluation as needed if family reports s/sx of dysphagia    Status Deferred    Target Date 08/04/21              SLP Long Term Goals - 08/02/21 1004       SLP LONG TERM GOAL #1   Title Pt will spontaneously use augmentative communication support for 4/5 opportunities to augment verbal expression over 2 sessions    Time 8    Period Weeks    Status On-going    Target Date 09/01/21      SLP LONG TERM GOAL #2   Title Pt will effectively communicate wants/needs/biographical information via augmentative comm system with 90% accuracy given rare min A over 2 sessions    Time 12    Period Weeks    Status On-going    Target Date 09/29/21      SLP LONG TERM GOAL #3   Title Pt/caregiver will indicate less frustration with use of augmentative communication system by last ST session    Time 12    Period Weeks    Status On-going    Target Date 09/29/21              Plan - 08/02/21 1004     Clinical Impression Statement Amy [name] was referred for OPST intervention secondary to aphasia from stroke in 2014. Pt is Spanish speaking and was accompanied by family member and interpreter this session. SLP educated and instructed basic comprehension and modification of trial Lingraphica device, with usual verbal and visual cues to modify same sequence to add family members. Translated handouts provided to family to add understanding of how to modify device. Further device education and modification will be needed to increase comprehension and ability to use to fullest extent. SLP recommends skilled ST intervention to address communication to optimize pt's ability to communicate her wants/needs/etc more effectively.    Speech Therapy Frequency 2x / week    Duration 12 weeks   scheduled 12 weeks to account for scheduling and  missed visits; anticiate d/c after 8 weeks after communication system established   Treatment/Interventions Patient/family education;Functional tasks;Multimodal communcation approach;Language facilitation;Compensatory techniques;Internal/external aids;SLP instruction and feedback  Potential to Achieve Goals Fair    Potential Considerations Severity of impairments;Other (comment)   time since onset   Consulted and Agree with Plan of Care Patient;Family member/caregiver             Patient will benefit from skilled therapeutic intervention in order to improve the following deficits and impairments:   Aphasia  Verbal apraxia    Problem List Patient Active Problem List   Diagnosis Date Noted   Dizziness 09/19/2020   Urinary frequency 09/19/2020   Encounter for counseling regarding advance directives 10/02/2019   Healthcare maintenance 08/05/2019   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 [place], [place], [address] Phone: [phone]   Fax:  [phone]   Name: Amy [name] MRN: [id number] Date of Birth: Dec 03, 1955

## 2021-08-02 NOTE — Therapy (Signed)
[facility] [address] [place], [place], [address] Phone: [phone]   Fax:  [phone]  Occupational Therapy Treatment  Patient Details  Name: Amy [name] MRN: [id number] Date of Birth: 28-Nov-1955 Referring Provider (OT): Dr. Owens Shark   Encounter Date: 08/02/2021   OT End of Session - 08/02/21 1018     Visit Number 8    Number of Visits 25    Date for OT Re-Evaluation 09/27/21    Authorization Type Medicare/ Medicaid    Authorization Time Period POC written for 12 weeks may d/c after 8 dependent on progress.    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   Subjective Assessment - 08/02/21 0936     Subjective  splint doing well    Patient is accompanied by: Family member;Interpreter    Pertinent History 65yo female with a history of hypertension, T2DM, hemorrhagic stroke (2015) with residual R sided deficit, seizure, anxiety, aphasia, hyperlipidemia, hx of TBI    Limitations h/o seizure 7 years ago    Patient Stated  Goals be more independent    Currently in Pain? No/denies             Discussed further potential A/E needs and DME needs specifically for showering and safety w/ tub transfers. Recommended bath mitt for Rt hand so pt can assist washing top of thighs and LUE w/ RUE as gross assist. Recommended LH Sponge so pt can wash feet and back as family is still doing for pt.   Also discussed options for tub transfers including: tub bench or shower chair w/ grab bar installed to increase independence and decrease reliance on family.   Practiced using Rt hand as stabilizer for opening bottle (holding against chest w/ Rt hand while opening twist top Lt hand), and holding paper while writing first name w/ Lt hand. Pt able to copy last name                        OT Short Term Goals - 08/02/21 1018       OT SHORT TERM GOAL #1   Title I with HEP    Time 4    Period Weeks  Status Achieved    Target Date 08/02/21      OT SHORT TERM GOAL #2   Title Pt will perform bathing with supervision    Time 4    Period Weeks    Status On-going   min assist for back and feet (pt/family issued handout for LH sponge and bath mitt)     OT SHORT TERM GOAL #3   Title Pt will use RUE as a stabilizer/ gross A at least 25% of the time.    Time 4    Period Weeks    Status On-going   not consistent     OT SHORT TERM GOAL #4   Title Pt will demonstrate ability to grasp/ relase a 2 inch block 2/5 trials.    Time 4    Period Weeks    Status On-going   Pt requires mod facilitation/assist at wrist and fingers     OT SHORT TERM GOAL #5   Title Pt will consistently perform light home management with no more than min A.    Time 4    Period Weeks    Status New               OT Long Term Goals - 07/05/21 1136       OT LONG TERM GOAL #1   Title I with updated HEP.    Time 12    Period Weeks    Status New    Target Date 09/27/21      OT LONG TERM GOAL #2   Title Pt will perform all  basic ADLS modified independently    Time 12    Period Weeks    Status New      OT LONG TERM GOAL #3   Title Pt will perform basic home management modified independently    Time 12    Period Weeks    Status New      OT LONG TERM GOAL #4   Title Pt will grasp/ release a 1 inch block 4/5 trials with RUE    Time 12    Period Weeks    Status New      OT LONG TERM GOAL #5   Title Pt will use RUE as a stabilizer/ gross A at least 40% of the time for ADLs/IADLs.    Time 12    Period Weeks    Status New      Long Term Additional Goals   Additional Long Term Goals Yes      OT LONG TERM GOAL #6   Title Pt will demonstrate A/ROM shoulder flexion of 120* in prep for functional reach    Time 12    Period Weeks    Status New                   Plan - 08/02/21 1020     Clinical Impression Statement Pt slowly progressing towards goals. Pt limited d/t time since onset and limited hand/wrist movement    OT Occupational Profile and History Detailed Assessment- Review of Records and additional review of physical, cognitive, psychosocial history related to current functional performance    Occupational performance deficits (Please refer to evaluation for details): ADL's;IADL's;Leisure;Play;Social Participation    Body Structure / Function / Physical Skills ADL;Endurance;UE functional use;Balance;Pain;Flexibility;FMC;ROM;GMC;Coordination;Decreased knowledge of precautions;Sensation;Decreased knowledge of use of DME;IADL;Dexterity;Strength;Tone;Mobility    Cognitive Skills Attention;Problem Solve;Safety Awareness;Thought    Rehab Potential Good    Clinical Decision Making Several treatment options, min-mod task  modification necessary   increased time, interpreter, demonstration due to aphasia   Comorbidities Affecting Occupational Performance: May have comorbidities impacting occupational performance   hypertension, T2DM, hemorrhagic stroke (2015) with residual R sided deficit, seizure,  anxiety, aphasia, hyperlipidemia. Dizziness   Modification or Assistance to Complete Evaluation  Min-Moderate modification of tasks or assist with assess necessary to complete eval   aphasia, inattention, increased time, lenght of time since onset   OT Frequency 2x / week   plus eval   OT Duration 12 weeks    OT Treatment/Interventions Self-care/ADL training;Ultrasound;Visual/perceptual remediation/compensation;Patient/family education;DME and/or AE instruction;Paraffin;Passive range of motion;Balance training;Fluidtherapy;Cryotherapy;Splinting;Moist Heat;Therapeutic exercise;Manual Therapy;Therapeutic activities;Cognitive remediation/compensation;Electrical Stimulation;Neuromuscular education    Plan continue modified wt bearing, AA/ROM and functional use as able RUE    Consulted and Agree with Plan of Care Patient;Family member/caregiver   interpreter            Patient will benefit from skilled therapeutic intervention in order to improve the following deficits and impairments:   Body Structure / Function / Physical Skills: ADL, Endurance, UE functional use, Balance, Pain, Flexibility, FMC, ROM, GMC, Coordination, Decreased knowledge of precautions, Sensation, Decreased knowledge of use of DME, IADL, Dexterity, Strength, Tone, Mobility Cognitive Skills: Attention, Problem Solve, Safety Awareness, Thought     Visit Diagnosis: Hemiplegia and hemiparesis following cerebral infarction affecting right dominant side (HCC)  Muscle weakness (generalized)  Attention and concentration deficit  Other lack of coordination    Problem List Patient Active Problem List   Diagnosis Date Noted   Dizziness 09/19/2020   Urinary frequency 09/19/2020   Encounter for counseling regarding advance directives 10/02/2019   Healthcare maintenance 08/05/2019   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) ([place]) 09/19/2015   Right facial numbness 02/09/2015   Anxiety state 10/28/2014    Language barrier to communication 10/28/2014   Cephalalgia    Change in vision 03/02/2014   Seizures ([place]) 02/24/2014   Hemorrhagic stroke ([place]) 02/16/2014   Essential hypertension 02/16/2014   DM type 2 (diabetes mellitus, type 2) ([place]) 02/16/2014   Aphasia as late effect of stroke 02/16/2014   Paralysis ([place]) 02/16/2014   Hyperlipidemia 02/16/2014    Carey Bullocks, OTR/L 08/02/2021, 10:21 AM  [facility] [address] [place], [place], [address] Phone: [phone]   Fax:  [phone]  Name: Amy [name] MRN: [id number] Date of Birth: Jan 19, 1956

## 2021-08-08 ENCOUNTER — Other Ambulatory Visit: Payer: Self-pay

## 2021-08-08 ENCOUNTER — Ambulatory Visit: Payer: Medicare Other | Admitting: Occupational Therapy

## 2021-08-08 ENCOUNTER — Ambulatory Visit: Payer: Medicare Other

## 2021-08-08 DIAGNOSIS — R4701 Aphasia: Secondary | ICD-10-CM | POA: Diagnosis not present

## 2021-08-08 DIAGNOSIS — R2681 Unsteadiness on feet: Secondary | ICD-10-CM

## 2021-08-08 DIAGNOSIS — I69351 Hemiplegia and hemiparesis following cerebral infarction affecting right dominant side: Secondary | ICD-10-CM

## 2021-08-08 DIAGNOSIS — M6281 Muscle weakness (generalized): Secondary | ICD-10-CM

## 2021-08-08 DIAGNOSIS — R278 Other lack of coordination: Secondary | ICD-10-CM

## 2021-08-08 DIAGNOSIS — R482 Apraxia: Secondary | ICD-10-CM

## 2021-08-08 DIAGNOSIS — R2689 Other abnormalities of gait and mobility: Secondary | ICD-10-CM

## 2021-08-08 DIAGNOSIS — R41840 Attention and concentration deficit: Secondary | ICD-10-CM

## 2021-08-08 NOTE — Therapy (Signed)
[facility] [address] [place], [place], [address] Phone: [phone]   Fax:  [phone]  Speech Language Pathology Treatment/Progress Note  Patient Details  Name: Amy [name] MRN: [id number] Date of Birth: 19-Oct-1955 Referring Provider (SLP): Martyn Malay, MD   Encounter Date: 08/08/2021   End of Session - 08/08/21 1009     Visit Number 10    Number of Visits 25    Date for SLP Re-Evaluation 09/29/21    Authorization Type Medicare/Medicaid    SLP Start Time 1015    SLP Stop Time  1100    SLP Time Calculation (min) 45 min    Activity Tolerance Patient tolerated treatment well             Past Medical History:  Diagnosis Date   Allergy    Anxiety    Brain injury    HX OF TRAUMATIC BRAIN INJURY   Constipation    RARE PER PT   Diabetes mellitus    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   Subjective Assessment - 08/08/21 1015     Subjective "good" re: device trial    Patient is accompained by: Family member   husband and interpreter   Currently in Pain? No/denies              Speech Therapy Progress Note  Dates of Reporting Period: 07-07-21 to current  Objective Reports of Subjective Statement: Pt has been seen for 10 ST visits targeting initiation of augmentative communication system due to severity of aphasia and apraxia impacting pt's ability to effectively verbally communicate.   Objective Measurements: SLP initiated trial of Lingraphica device to optimize  pt's communication due to severity of aphasia and apraxia. Pt and family exhibit motivation and increasing understanding of how to use trial device. SLP will continue to educate, train, and modify device to optimize pt's comprehension and usage to augment verbal expression to ensure pt would benefit from permanent device.   Goal Update: see goals below   Plan: continue per POC   Reason Skilled Services are Required: pt has not yet met rehab potential; therefore, SLP recommends continued skilled ST for creation of effective communication system as pt unable to verbally communicate at this time      ADULT SLP TREATMENT - 08/08/21 1008       General Information   Behavior/Cognition Alert;Cooperative;Pleasant mood;Requires cueing      Treatment Provided   Treatment provided Cognitive-Linquistic      Cognitive-Linquistic Treatment   Treatment focused on Aphasia;Apraxia    Skilled Treatment SLP again provided education and training of trial Lingraphica device with patient and husband. Husband did not recall how to modify device, in which pt's granddaughter has reportedly been helping her. SLP emphasized importance of multiple family members understanding how to operate device to aid trial process and pt understanding. SLP demonstrated how to add preferences for snacks  and drinks as well as reduce unnessary icons for breakfast and lunch. Pt benefited from usual fading to occasional min A to navigate back to recent page for additional selections. Usual repetition and rephrasing of information needed to improve auditory comprehension.      Assessment / Recommendations / Plan   Plan Continue with current plan of care      Progression Toward Goals   Progression toward goals Progressing toward goals              SLP Education - 08/08/21 1353     Education Details how to modify device    Person(s) Educated Patient;Spouse    Methods Explanation;Demonstration;Verbal cues    Comprehension  Verbalized understanding;Returned demonstration;Verbal cues required;Need further instruction              SLP Short Term Goals - 08/08/21 1010       SLP SHORT TERM GOAL #1   Title Pt will ID and select correct AAC icon with 80% accuracy given choice of 6 over 2 sessions    Baseline 07-17-21    Status Partially Met    Target Date 08/04/21      SLP SHORT TERM GOAL #2   Title Pt will use multimodal communication as able to augment verbal expression for communication of wants/needs given occasional min A over 2 sessions    Baseline 07-28-21, 08-02-21    Status Achieved    Target Date 08/04/21      SLP SHORT TERM GOAL #3   Title Pt will use external communication support for 4/5 opportunities to augment verbal expression given occasional min A over 2 sessions    Baseline 07-28-21    Status Partially Met    Target Date 08/04/21      SLP SHORT TERM GOAL #4   Title SLP will assess pt abilty to write/type for multimodal communciation in first few ST sessions    Status Achieved    Target Date 08/04/21      SLP SHORT TERM GOAL #5   Title Pt will participate in clinical bedside swallow evaluation as needed if family reports s/sx of dysphagia    Status Deferred    Target Date 08/04/21              SLP Long Term Goals - 08/08/21 1010       SLP LONG TERM GOAL #1   Title Pt will spontaneously use augmentative communication support for 4/5 opportunities to augment verbal expression over 2 sessions    Time 8    Period Weeks    Status On-going    Target Date 09/01/21      SLP LONG TERM GOAL #2   Title Pt will effectively communicate wants/needs/biographical information via augmentative comm system with 90% accuracy given rare min A over 2 sessions    Time 12    Period Weeks    Status On-going    Target Date 09/29/21      SLP LONG TERM GOAL #3   Title Pt/caregiver will indicate less frustration with use of augmentative communication system by last ST session    Time 12     Period Weeks    Status On-going    Target Date 09/29/21              Plan - 08/08/21 1010     Clinical Impression Statement Amy [name] was referred for OPST intervention secondary to aphasia from stroke in 2014. Pt is Spanish speaking and was accompanied by  family member and interpreter this session. SLP educated and instructed basic comprehension and modification of trial Lingraphica device, with usual verbal and visual cues to modify food selections. Additional education provided to husband to increase understanding to aid patient comprehension. Further device education and modification will be needed to increase comprehension and ability to use device to fullest extent. SLP recommends skilled ST intervention to address communication to optimize pt's ability to communicate her wants/needs/etc more effectively.    Speech Therapy Frequency 2x / week    Duration 12 weeks   scheduled 12 weeks to account for scheduling and missed visits; anticiate d/c after 8 weeks after communication system established   Treatment/Interventions Patient/family education;Functional tasks;Multimodal communcation approach;Language facilitation;Compensatory techniques;Internal/external aids;SLP instruction and feedback    Potential to Achieve Goals Fair    Potential Considerations Severity of impairments;Other (comment)   time since onset   Consulted and Agree with Plan of Care Patient;Family member/caregiver             Patient will benefit from skilled therapeutic intervention in order to improve the following deficits and impairments:   Aphasia  Verbal apraxia    Problem List Patient Active Problem List   Diagnosis Date Noted   Dizziness 09/19/2020   Urinary frequency 09/19/2020   Encounter for counseling regarding advance directives 10/02/2019   Healthcare maintenance 08/05/2019   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 [place], [place], [address] Phone: [phone]   Fax:  [phone]   Name: Amy [name] MRN: [id number] Date of Birth: 10-26-1955

## 2021-08-08 NOTE — Therapy (Signed)
[facility] [address] [place], [place], [address] Phone: [phone]   Fax:  [phone]  Occupational Therapy Treatment  Patient Details  Name: Amy [name] MRN: [id number] Date of Birth: 16-May-1956 Referring Provider (OT): Dr. Owens Shark   Encounter Date: 08/08/2021   OT End of Session - 08/08/21 1206     Visit Number 9    Number of Visits 25    Date for OT Re-Evaluation 09/27/21    Authorization Type Medicare/ Medicaid    Authorization Time Period POC written for 12 weeks may d/c after 8 dependent on progress.    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       Pain: Pt denies pain via interpreter Treatment: Table slides with bilateral UE's for shoulder flexion, followed by holding "paper towel roll" for closed chain reaching to floor then back to lap, min-mod facilitation / v.c Standing weight bearing through bilateral UE's to perform low range table slides, min facilitation/ v.c Functional grasp/ release of  wooden dowel pegs with pt supporting RUE with left hand, mod facilitation/ v.c Folding washcloths using RUE as a stabilizer and to pinch edge of washcloth, mod facilitation/ v.c                     OT Short Term Goals - 08/08/21 1208       OT SHORT TERM GOAL #1   Title I with HEP    Time 4    Period Weeks    Status Achieved    Target Date 08/02/21      OT SHORT TERM GOAL #2   Title Pt will perform bathing with supervision    Time 4    Period Weeks    Status On-going   min assist for back and feet (pt/family issued handout for LH sponge and bath mitt)     OT SHORT TERM GOAL #3   Title Pt will use RUE as a stabilizer/ gross A at least 25% of the time.    Time 4    Period Weeks    Status On-going   not consistent     OT SHORT TERM GOAL #4   Title Pt will demonstrate ability  to grasp/ relase a 2 inch block 2/5 trials.    Time 4    Period Weeks    Status On-going   Pt requires mod facilitation/assist at wrist and fingers     OT SHORT TERM GOAL #5   Title Pt will consistently perform light home management with no more than min A.    Time 4    Period Weeks    Status On-going   not consistent              OT Long Term Goals - 08/08/21 1208       OT LONG TERM GOAL #1   Title I with updated HEP.    Time 12    Period Weeks    Status On-going      OT LONG TERM GOAL #2   Title Pt will perform all basic ADLS modified independently    Time 12    Period Weeks    Status On-going      OT LONG TERM GOAL #3   Title Pt will perform basic home management modified independently    Time 12    Period Weeks    Status On-going      OT LONG TERM GOAL #4   Title Pt will grasp/ release a 1 inch block 4/5 trials with RUE    Time 12    Period Weeks    Status On-going      OT LONG TERM GOAL #5   Title Pt will use RUE as a stabilizer/ gross A at least 40% of the time for ADLs/IADLs.    Time 12    Period Weeks    Status On-going      OT LONG TERM GOAL #6    Title Pt will demonstrate A/ROM shoulder flexion of 120* in prep for functional reach    Time 12    Period Weeks    Status On-going                   Plan - 08/08/21 1206     Clinical Impression Statement Pt is progressing towards goals.She remains limited by R inattention, and aphasia.    OT Occupational Profile and History Detailed Assessment- Review of Records and additional review of physical, cognitive, psychosocial history related to current functional performance    Occupational performance deficits (Please refer to evaluation for details): ADL's;IADL's;Leisure;Play;Social Participation    Body Structure / Function / Physical Skills ADL;Endurance;UE functional use;Balance;Pain;Flexibility;FMC;ROM;GMC;Coordination;Decreased knowledge of precautions;Sensation;Decreased knowledge of use of DME;IADL;Dexterity;Strength;Tone;Mobility    Cognitive Skills Attention;Problem Solve;Safety Awareness;Thought    Rehab Potential Good    Clinical Decision Making Several treatment options, min-mod task modification necessary   increased time, interpreter, demonstration due to aphasia   Comorbidities Affecting Occupational Performance: May have comorbidities impacting occupational performance   hypertension, T2DM, hemorrhagic stroke (2015) with residual R sided deficit, seizure, anxiety, aphasia, hyperlipidemia. Dizziness   Modification or Assistance to Complete Evaluation  Min-Moderate modification of tasks or assist with assess necessary to complete eval   aphasia, inattention, increased time, lenght of time since onset   OT Frequency 2x / week   plus eval   OT Duration 12 weeks    OT Treatment/Interventions Self-care/ADL training;Ultrasound;Visual/perceptual remediation/compensation;Patient/family education;DME and/or AE instruction;Paraffin;Passive range of motion;Balance training;Fluidtherapy;Cryotherapy;Splinting;Moist Heat;Therapeutic exercise;Manual Therapy;Therapeutic activities;Cognitive  remediation/compensation;Electrical Stimulation;Neuromuscular education    Plan continue modified wt bearing, AA/ROM and functional use as able RUE    Consulted and Agree with Plan of Care Patient;Family member/caregiver   interpreter  Patient will benefit from skilled therapeutic intervention in order to improve the following deficits and impairments:   Body Structure / Function / Physical Skills: ADL, Endurance, UE functional use, Balance, Pain, Flexibility, FMC, ROM, GMC, Coordination, Decreased knowledge of precautions, Sensation, Decreased knowledge of use of DME, IADL, Dexterity, Strength, Tone, Mobility Cognitive Skills: Attention, Problem Solve, Safety Awareness, Thought     Visit Diagnosis: Hemiplegia and hemiparesis following cerebral infarction affecting right dominant side (HCC)  Muscle weakness (generalized)  Attention and concentration deficit  Other lack of coordination  Unsteadiness on feet  Other abnormalities of gait and mobility    Problem List Patient Active Problem List   Diagnosis Date Noted   Dizziness 09/19/2020   Urinary frequency 09/19/2020   Encounter for counseling regarding advance directives 10/02/2019   Healthcare maintenance 08/05/2019   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 [address] [place], [place], [address] Phone: [phone]   Fax:   [phone]  Name: Amy [name] MRN: [id number] Date of Birth: 09-14-1956

## 2021-08-11 ENCOUNTER — Ambulatory Visit: Payer: Medicare Other

## 2021-08-11 ENCOUNTER — Other Ambulatory Visit: Payer: Self-pay

## 2021-08-11 DIAGNOSIS — R482 Apraxia: Secondary | ICD-10-CM

## 2021-08-11 DIAGNOSIS — R4701 Aphasia: Secondary | ICD-10-CM

## 2021-08-11 NOTE — Therapy (Signed)
[facility] [address] [place], [place], [address] Phone: [phone]   Fax:  [phone]  Patient Details  Name: Amy [name] MRN: [id number] Date of Birth: 02-21-1956 Referring Provider:  Martyn Malay, MD  Encounter Date: 08/11/2021   Speech Therapy (ST) session Daughter in [place] (DIL) presented with pt today during this SLP's first time seeing the patient. Interpreter also present. SLP initiated therapy using Lingraphica speech generating device, per pt's POC, asking pt to access for home page (cues req'd), and asking pt to indicate what she has drunk today (req'd cues)- targeting usage of communication device. DIL stated she has not been with pt to use device, and said family was concerned that "they were going to take this device today" (per interpreter). DIL then told SLP concern about OT recommending a knife for pt and providing information about how to obtain the knife. DIL very concerned OT would recommend a knife when pt's MD has specifically told pt not to use knives. SLP attempted to reassure DIL that OT would not have recommended a knife if she thought pt would not be safe with it, or there may have been some precautions that were told to person attending with pt (husband) which were not communicated to family. DIL stated she wanted all OT canceled until they could confer with pt's MD about the knife.   DIL then stated she would like some sort of a summary from each therapist provided after every visit, in [place], so that all family can read any recommendations and any home tasks for the patient. SLP assured DIL that therapists attempt to provide good communication for family but sometimes these do not get shared with all family members for whatever reason. SLP encouraged pt/DIL to make sure pt brings her blue folder with her to all therapy sessions so that therapists have a place to put these summaries to ensure  good communication between therapists and family. DIL voiced understanding. SLP encouraged DIL to attend therapies with her mother in law but DIL stated she could not attend more sessions due to having children.  SLP tried to provide some options for pt/DIL to discuss this situation with OT without canceling OT next week, but OT providing knife recommendation was not working today in the clinic. DIL reiterated she would like OT cx'd for next week, and they will inqiure with the MD. SLP put f/u message in to front office to cx pt's OT for next week.  Lastly, SLP assured DIL that the device will not be "taken" until mid-November at which time primary SLP will make a recommendation for or not for a permanent device for pt. DIL voiced understanding.  [facility] ,[place], [place]  08/11/2021, 2:10 PM  [facility] [address] [place], [place], [address] Phone: [phone]   Fax:  [phone]

## 2021-08-14 ENCOUNTER — Other Ambulatory Visit: Payer: Self-pay

## 2021-08-14 ENCOUNTER — Encounter: Payer: Medicare Other | Admitting: Occupational Therapy

## 2021-08-14 ENCOUNTER — Ambulatory Visit: Payer: Medicare Other

## 2021-08-14 DIAGNOSIS — R482 Apraxia: Secondary | ICD-10-CM

## 2021-08-14 DIAGNOSIS — R4701 Aphasia: Secondary | ICD-10-CM

## 2021-08-14 NOTE — Therapy (Signed)
[facility] [address] [place], [place], [address] Phone: [phone]   Fax:  [phone]  Speech Language Pathology Treatment  Patient Details  Name: Amy [name] MRN: [id number] Date of Birth: 1956-05-27 Referring Provider (SLP): Martyn Malay, MD   Encounter Date: 08/14/2021   End of Session - 08/14/21 1005     Visit Number 11    Number of Visits 25    Date for SLP Re-Evaluation 09/29/21    Authorization Type Medicare/Medicaid    SLP Start Time 1015    SLP Stop Time  1107    SLP Time Calculation (min) 52 min    Activity Tolerance Patient tolerated treatment well             Past Medical History:  Diagnosis Date   Allergy    Anxiety    Brain injury    HX OF TRAUMATIC BRAIN INJURY   Constipation    RARE PER PT   Diabetes mellitus    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   Subjective Assessment - 08/14/21 1017     Subjective "she wants to live with it" re: husband    Patient is accompained by: Family member    Currently in Pain? No/denies                   ADULT SLP TREATMENT - 08/14/21 1004       General Information   Behavior/Cognition Alert;Cooperative;Pleasant mood;Requires cueing      Treatment Provided   Treatment provided Cognitive-Linquistic      Cognitive-Linquistic Treatment   Treatment focused on Aphasia;Apraxia    Skilled Treatment Device on modification page for restaurants, but family endorses not attempts to modify. SLP again educated  patient and husband on rationale for 30 day Ling[employer] trial for patient and family to learn how to use and implement device to establish if this is an appropriate communciation system for patient. Husband stated "we don't get the point if they're going to take it away." Reviewed purpose for trial to see if patient is appropriate for permenant device and that pt would receive permanent device if she understands and benefits from the device to communicate as SLP again explained limited expectation to recover verbal expression 8 years post stroke. Husband and patient indicated understanding. SLP has provided written handouts and information re: device trial and request for another family member to attend for further education and training. SLP demonstrated how to modify sections and practice scrolling and identifying targeted objects. Usual fading to occasional mod A required to ID specific pages and icons this session. SLP modifed device to include icons > 3 as pt exhibited reduced comprehension on ability to scroll and locate other items.      Assessment / Recommendations / Plan   Plan Continue with current plan of care      Progression Toward Goals   Progression toward  goals Not progressing toward goals (comment)   carryover of SLP recommendations and understanding of device             SLP Education - 08/14/21 1043     Education Details rationale for trial, family needs to be using and modifying device to ensure understanding for family and patient    Person(s) Educated Patient;Spouse    Methods Explanation;Demonstration;Handout;Verbal cues    Comprehension Verbalized understanding;Returned demonstration;Verbal cues required;Need further instruction              SLP Short Term Goals - 08/08/21 1010       SLP SHORT TERM GOAL #1   Title Pt will ID and select correct AAC icon with 80% accuracy given choice of 6 over 2 sessions    Baseline 07-17-21    Status Partially Met    Target  Date 08/04/21      SLP SHORT TERM GOAL #2   Title Pt will use multimodal communication as able to augment verbal expression for communication of wants/needs given occasional min A over 2 sessions    Baseline 07-28-21, 08-02-21    Status Achieved    Target Date 08/04/21      SLP SHORT TERM GOAL #3   Title Pt will use external communication support for 4/5 opportunities to augment verbal expression given occasional min A over 2 sessions    Baseline 07-28-21    Status Partially Met    Target Date 08/04/21      SLP SHORT TERM GOAL #4   Title SLP will assess pt abilty to write/type for multimodal communciation in first few ST sessions    Status Achieved    Target Date 08/04/21      SLP SHORT TERM GOAL #5   Title Pt will participate in clinical bedside swallow evaluation as needed if family reports s/sx of dysphagia    Status Deferred    Target Date 08/04/21              SLP Long Term Goals - 08/14/21 1006       SLP LONG TERM GOAL #1   Title Pt will spontaneously use augmentative communication support for 4/5 opportunities to augment verbal expression over 2 sessions    Time 8    Period Weeks    Status On-going    Target Date 09/01/21      SLP LONG TERM GOAL #2   Title Pt will effectively communicate wants/needs/biographical information via augmentative comm system with 90% accuracy given rare min A over 2 sessions    Time 12    Period Weeks    Status On-going    Target Date 09/29/21      SLP LONG TERM GOAL #3   Title Pt/caregiver will indicate less frustration with use of augmentative communication system by last ST session    Time 12    Period Weeks    Status On-going    Target Date 09/29/21              Plan - 08/14/21 1005     Clinical Impression Statement Maudry Diego was referred for OPST intervention secondary to aphasia from stroke in 2014. Pt is Spanish speaking and was accompanied by husband and interpreter this session. SLP again provided verbal,  visual, and written education on how to use device to increase patient comprehension and communciation as well as basic modifications of trial Lingraph[employer] device to personalize and customize device. SLP targeted use of home button and back button  this session to navigate to various pages. Additional education provided to husband to increase understanding of device trial purpose and rationale as well as need to provide further device education and modification with an alternative family member in order to aid patient comprehension and ability to use device to fullest extent. SLP recommends skilled ST intervention to address communication to optimize pt's ability to communicate her wants/needs/etc more effectively.    Speech Therapy Frequency 2x / week    Duration 12 weeks   scheduled 12 weeks to account for scheduling and missed visits; anticiate d/c after 8 weeks after communication system established   Treatment/Interventions Patient/family education;Functional tasks;Multimodal communcation approach;Language facilitation;Compensatory techniques;Internal/external aids;SLP instruction and feedback    Potential to Achieve Goals Fair    Potential Considerations Severity of impairments;Other (comment);Ability to learn/carryover information;Family/community support   time since onset   Consulted and Agree with Plan of Care Patient;Family member/caregiver             Patient will benefit from skilled therapeutic intervention in order to improve the following deficits and impairments:   Aphasia  Verbal apraxia    Problem List Patient Active Problem List   Diagnosis Date Noted   Dizziness 09/19/2020   Urinary frequency 09/19/2020   Encounter for counseling regarding advance directives 10/02/2019   Healthcare maintenance 08/05/2019   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 [place], [place], [address] Phone: [phone]   Fax:  [phone]   Name: Maia Handa MRN: [id number] Date of Birth: January 10, 1956

## 2021-08-14 NOTE — Patient Instructions (Signed)
He proporcionado hoja escritos para ayudar con la forma de modificar el dispositivo para que podamos personalizarlo mejor para Amy [name]. Me complace proporcionar ms entrenamiento con otros miembros de la familia para que todos entiendan cmo usar el dispositivo para ayudar a Amy [name] a usarlo mejor. [place] [phone] si tiene preguntas sobre el dispositivo.   Recuerde,la prueba del dispositivo termina el 14-11-22. El propsito de la prueba del dispositivo es ver si este es un sistema de comunicacin efectivo para ella. Necesito saber que ella entiende cmo usarlo para [employer]. Necesitar prctica diaria para ayudarla a entender. Formas en que puedes ayudar: puedes pedirle que encuentre ciertos artculos (por ejemplo, encontrar tu restaurante favorito, miembros de la familia, informacin personal). Continuaremos modificando el dispositivo para incluir sus preferencias.

## 2021-08-16 ENCOUNTER — Other Ambulatory Visit: Payer: Self-pay | Admitting: Student

## 2021-08-16 ENCOUNTER — Telehealth: Payer: Self-pay | Admitting: Student

## 2021-08-16 DIAGNOSIS — R569 Unspecified convulsions: Secondary | ICD-10-CM

## 2021-08-16 DIAGNOSIS — E119 Type 2 diabetes mellitus without complications: Secondary | ICD-10-CM

## 2021-08-16 DIAGNOSIS — Z794 Long term (current) use of insulin: Secondary | ICD-10-CM

## 2021-08-16 MED ORDER — LEVETIRACETAM 500 MG PO TABS: 500.0000 mg | ORAL_TABLET | Freq: Two times a day (BID) | ORAL | 2 refills | Status: DC

## 2021-08-16 MED ORDER — EMPAGLIFLOZIN 10 MG PO TABS: 10.0000 mg | ORAL_TABLET | Freq: Every day | ORAL | 3 refills | Status: DC

## 2021-08-16 NOTE — Telephone Encounter (Signed)
Patients son walked in and would like to have his mothers jardiance and levetiracetam refilled.

## 2021-08-18 ENCOUNTER — Ambulatory Visit: Payer: Medicare Other | Attending: Family Medicine

## 2021-08-18 ENCOUNTER — Other Ambulatory Visit: Payer: Self-pay

## 2021-08-18 DIAGNOSIS — R278 Other lack of coordination: Secondary | ICD-10-CM | POA: Diagnosis present

## 2021-08-18 DIAGNOSIS — R2681 Unsteadiness on feet: Secondary | ICD-10-CM | POA: Diagnosis present

## 2021-08-18 DIAGNOSIS — I69351 Hemiplegia and hemiparesis following cerebral infarction affecting right dominant side: Secondary | ICD-10-CM | POA: Diagnosis present

## 2021-08-18 DIAGNOSIS — R4701 Aphasia: Secondary | ICD-10-CM | POA: Diagnosis not present

## 2021-08-18 DIAGNOSIS — M6281 Muscle weakness (generalized): Secondary | ICD-10-CM | POA: Diagnosis present

## 2021-08-18 DIAGNOSIS — R41840 Attention and concentration deficit: Secondary | ICD-10-CM | POA: Diagnosis present

## 2021-08-18 DIAGNOSIS — R482 Apraxia: Secondary | ICD-10-CM | POA: Diagnosis present

## 2021-08-18 NOTE — Therapy (Signed)
[facility] [address] [place], [place], [address] Phone: [phone]   Fax:  [phone]  Speech Language Pathology Treatment  Patient Details  Name: Amy [name] MRN: [id number] Date of Birth: 10-29-1955 Referring Provider (SLP): Martyn Malay, MD   Encounter Date: 08/18/2021   End of Session - 08/18/21 1145     Visit Number 12    Number of Visits 25    Date for SLP Re-Evaluation 09/29/21    Authorization Type Medicare/Medicaid    SLP Start Time 0933    SLP Stop Time  1014    SLP Time Calculation (min) 41 min    Activity Tolerance Patient tolerated treatment well             Past Medical History:  Diagnosis Date   Allergy    Anxiety    Brain injury    HX OF TRAUMATIC BRAIN INJURY   Constipation    RARE PER PT   Diabetes mellitus    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   Subjective Assessment - 08/18/21 1137     Subjective icons gone from device with no explanation for how    Patient is accompained by: Family member   granddaughter and husband   Currently in Pain? No/denies                   ADULT SLP TREATMENT - 08/18/21 1138       General Information   Behavior/Cognition Alert;Cooperative;Pleasant mood;Requires cueing;Doesn't follow directions      Treatment Provided   Treatment provided Cognitive-Linquistic      Cognitive-Linquistic Treatment   Treatment focused on Aphasia;Apraxia    Skilled Treatment Pt's granddaughter and husband present this  session. Granddaughter has been assisting with implementation and modification of device. SLP assessed device, which was completely blank as no icons present on main page. Complaints of device freezing reported, so device was reset. No icons present following reset. SLP individually reset main page, which was effective. Of note, device was not translating Spanish appropriately although device was set in Spanish for all languages. SLP contacted [employer] rep re: change in accent, absence of icons, and freezing. SLP educated and demonstrated to granddaughter how to add/edit icons, add pictures (photos or icons), add own voice, and how to delete items. Pt's granddaughter verbalized understanding and expressed comfort with implementing these modifications. SLP again re-iterated how to practice with device at home (locate a certain page/item, redirect pt to device if she is attempting to communicate, etc). SLP also stressed importance of daily practice to increase familiarity and understanding of device with multiple practices recommended each day. Usual fading to occasional mod verbal and visual cues required for patient to demo how to navigate device (back arrow and homepage) and occasional min A to scroll. Pt identified concrete icons with good accuracy this session.      Assessment / Recommendations /  Plan   Plan Continue with current plan of care      Progression Toward Goals   Progression toward goals Progressing toward goals (slow progression)             SLP Education - 08/18/21 1143     Education Details how to customize device (add pictures/icons, add own voice/change pronounciation, delete items)    Person(s) Educated Patient;Spouse;Child(ren)    Methods Explanation;Demonstration;Verbal cues    Comprehension Verbalized understanding;Returned demonstration;Verbal cues required;Need further instruction              SLP Short Term Goals - 08/08/21 1010       SLP SHORT TERM GOAL #1    Title Pt will ID and select correct AAC icon with 80% accuracy given choice of 6 over 2 sessions    Baseline 07-17-21    Status Partially Met    Target Date 08/04/21      SLP SHORT TERM GOAL #2   Title Pt will use multimodal communication as able to augment verbal expression for communication of wants/needs given occasional min A over 2 sessions    Baseline 07-28-21, 08-02-21    Status Achieved    Target Date 08/04/21      SLP SHORT TERM GOAL #3   Title Pt will use external communication support for 4/5 opportunities to augment verbal expression given occasional min A over 2 sessions    Baseline 07-28-21    Status Partially Met    Target Date 08/04/21      SLP SHORT TERM GOAL #4   Title SLP will assess pt abilty to write/type for multimodal communciation in first few ST sessions    Status Achieved    Target Date 08/04/21      SLP SHORT TERM GOAL #5   Title Pt will participate in clinical bedside swallow evaluation as needed if family reports s/sx of dysphagia    Status Deferred    Target Date 08/04/21              SLP Long Term Goals - 08/18/21 1148       SLP LONG TERM GOAL #1   Title Pt will spontaneously use augmentative communication support for 4/5 opportunities to augment verbal expression over 2 sessions    Time 8    Period Weeks    Status On-going    Target Date 09/01/21      SLP LONG TERM GOAL #2   Title Pt will effectively communicate wants/needs/biographical information via augmentative comm system with 90% accuracy given rare min A over 2 sessions    Time 12    Period Weeks    Status On-going    Target Date 09/29/21      SLP LONG TERM GOAL #3   Title Pt/caregiver will indicate less frustration with use of augmentative communication system by last ST session    Time 12    Period Weeks    Status On-going    Target Date 09/29/21              Plan - 08/18/21 1145     Clinical Impression Statement Maudry Diego was referred for OPST intervention  secondary to aphasia from stroke in 2014. Pt is Spanish speaking and was accompanied by husband and granddaugher this session. Pt's granddaughter verbalized understanding how to use device with request for SLP demo how to make personalized modifications. SLP provided verbal and visual education on how to make basic modifications of trial Ling[employer] device to personalize  and customize device as well as how to increase patient comprehension and communciation through daily practice and targeted trials. SLP anticipates increased comprehension and usage with additional family member educated on how to operate and modify device for communication of patient's wants/needs. SLP recommends skilled ST intervention to address communication to optimize pt's ability to communicate her wants/needs/etc more effectively.    Speech Therapy Frequency 2x / week    Duration 12 weeks   scheduled 12 weeks to account for scheduling and missed visits; anticiate d/c after 8 weeks after communication system established   Treatment/Interventions Patient/family education;Functional tasks;Multimodal communcation approach;Language facilitation;Compensatory techniques;Internal/external aids;SLP instruction and feedback    Potential to Achieve Goals Fair    Potential Considerations Severity of impairments;Other (comment);Ability to learn/carryover information;Family/community support   time since onset   Consulted and Agree with Plan of Care Patient;Family member/caregiver    Family Member Consulted granddaughter & husband             Patient will benefit from skilled therapeutic intervention in order to improve the following deficits and impairments:   Aphasia  Verbal apraxia    Problem List Patient Active Problem List   Diagnosis Date Noted   Dizziness 09/19/2020   Urinary frequency 09/19/2020   Encounter for counseling regarding advance directives 10/02/2019   Healthcare maintenance 08/05/2019   Seizure as late  effect of cerebrovascular accident (CVA) ([facility]) 09/19/2015   Right facial numbness 02/09/2015   Anxiety state 10/28/2014   Language barrier to communication 10/28/2014   Cephalalgia    Change in vision 03/02/2014   Seizures ([facility]) 02/24/2014   Hemorrhagic stroke ([facility]) 02/16/2014   Essential hypertension 02/16/2014   DM type 2 (diabetes mellitus, type 2) ([facility]) 02/16/2014   Aphasia as late effect of stroke 02/16/2014   Paralysis ([place]) 02/16/2014   Hyperlipidemia 02/16/2014    Alinda Deem, MA CCC-SLP 08/18/2021, 11:48 AM  [facility] [address] [place], [place], [address] Phone: [phone]   Fax:  [phone]   Name: Alaze Garverick MRN: [id number] Date of Birth: 01-12-1956

## 2021-08-21 ENCOUNTER — Ambulatory Visit: Payer: Medicare Other

## 2021-08-21 ENCOUNTER — Ambulatory Visit: Payer: Medicare Other | Admitting: Occupational Therapy

## 2021-08-23 ENCOUNTER — Other Ambulatory Visit: Payer: Self-pay

## 2021-08-23 ENCOUNTER — Encounter: Payer: Self-pay | Admitting: Student

## 2021-08-23 ENCOUNTER — Ambulatory Visit (INDEPENDENT_AMBULATORY_CARE_PROVIDER_SITE_OTHER): Payer: Medicare Other | Admitting: Student

## 2021-08-23 VITALS — BP 112/64 | HR 78 | Ht 61.0 in | Wt 128.2 lb

## 2021-08-23 DIAGNOSIS — Z0000 Encounter for general adult medical examination without abnormal findings: Secondary | ICD-10-CM | POA: Diagnosis present

## 2021-08-23 DIAGNOSIS — E119 Type 2 diabetes mellitus without complications: Secondary | ICD-10-CM | POA: Diagnosis not present

## 2021-08-23 DIAGNOSIS — Z794 Long term (current) use of insulin: Secondary | ICD-10-CM

## 2021-08-23 DIAGNOSIS — I619 Nontraumatic intracerebral hemorrhage, unspecified: Secondary | ICD-10-CM

## 2021-08-23 LAB — POCT GLYCOSYLATED HEMOGLOBIN (HGB A1C): HbA1c, POC (controlled diabetic range): 6.8 % (ref 0.0–7.0)

## 2021-08-23 NOTE — Patient Instructions (Addendum)
Amy [name],  It is such a joy to take care you! Thank you for coming in today.   As a reminder, here is a recap of what we talked about today:  - Your A1c today was 6.8, this is within your goal range! You should be very proud of the hard work you've been doing with diet and exercise. Continue taking your Jardiance and Metformin as you have been.  - Continue to take your meclizine as needed for your dizziness. I am glad that you are continuing to exercise and go to physical therapy to keep you steady on your feet.   - I'd like to see you back in a few weeks to discuss the big picture of your stroke recovery and therapies that you can pursue going forward.   I recommend that you always bring your medications to each appointment as this makes it easy to ensure we are on the correct medications and helps [place] not miss when refills are needed.  Take care and seek immediate care sooner if you develop any concerns.   Marnee Guarneri, MD [facility]

## 2021-08-23 NOTE — Progress Notes (Signed)
SUBJECTIVE:   CHIEF COMPLAINT / HPI:   Diabetic Follow Up: Patient is a 65 y.o. female who present today for diabetic follow up.   Home medications include Jardiance and metformin which she takes as prescribed and has no issues with affording her medications.  She also exercises daily and has since she had her stroke.   Most recent A1Cs:  Lab Results  Component Value Date   HGBA1C 6.8 08/23/2021   HGBA1C 6.7 04/25/2021   HGBA1C 6.9 (A) 09/19/2020   Last Microalbumin, LDL, Creatinine: Lab Results  Component Value Date   LDLCALC 46 05/26/2021   CREATININE 0.64 04/25/2021    Hemorrhagic Stroke:  Hemorrhagic stroke in 2015 with residual R-sided strength deficit, seizure disorder, anxiety, aphasia. She had been seeing physical therapy to improve her mobility and is now able to walk independently. She has a persistent limp, but is stable on her feet. She continues to exercise at home daily.  Her daughter-in-law however is concerned that she has not regained the ability to walk or use her right arm. She is encouraged that her mother-in-law has regained near full function in her RLE with ability to walk but has not regained other functions. She feels that her therapists have "given up" on regaining speech or right hand function and have transitioned to trying alternative communication tools such as an iPad or using adaptive utensils with the properly functioning L hand. She feels very strongly that her mother-in-law would be able to regain full function with the proper therapy.   Health maintenance: Gave patient Rx for shingles vaccine at last visit, patient has not yet gone to pharmacy to get her vaccine but does still have paper prescription and plans to go back for shingles vaccine later this week.   PERTINENT  PMH / PSH: Hemorrhagic Stroke (2015)  OBJECTIVE:   BP 112/64   Pulse 78   Ht 5\' 1"  (1.549 m)   Wt 128 lb 3.2 oz (58.2 kg)   SpO2 97%   BMI 24.22 kg/m   Physical  Exam Vitals reviewed.  Cardiovascular:     Rate and Rhythm: Normal rate and regular rhythm.     Pulses: Normal pulses.     Heart sounds: Normal heart sounds. No murmur heard.   No friction rub. No gallop.  Pulmonary:     Effort: Pulmonary effort is normal.     Breath sounds: Normal breath sounds. No wheezing, rhonchi or rales.  Abdominal:     General: Abdomen is flat. There is no distension.     Tenderness: There is no abdominal tenderness.  Skin:    General: Skin is warm and dry.  Neurological:     Mental Status: She is alert. Mental status is at baseline.     Comments: RUE contracted with diffuse atrophy of forearm and hand muscles Hand squeeze 5/5 on L, 1/5 on R  Psychiatric:        Mood and Affect: Mood normal.     ASSESSMENT/PLAN:   DM type 2 (diabetes mellitus, type 2) A1c 6.8% today. At goal. Continue Jardiance and metformin. Congratulated on success with diet/exercise interventions.   Hemorrhagic stroke Patient walking with slight limp, but overall stable gait. Per interview, it seems that patient and family members are expecting continued recovery of function of RUE and speech and are resistant to exploring assistive technologies at this time. Will have patient back to have a dedicated visit for frank discussion of stroke recovery expectations and goals of care.  If insistent on a second opinion, can consider referral to PM&R at that time.   Healthcare maintenance Patient has Rx for shingles vaccine. Plans to have this done later this week.      Pearla Dubonnet, MD [facility]

## 2021-08-24 NOTE — Assessment & Plan Note (Signed)
A1c 6.8% today. At goal. Continue Jardiance and metformin. Congratulated on success with diet/exercise interventions.

## 2021-08-24 NOTE — Assessment & Plan Note (Signed)
Patient walking with slight limp, but overall stable gait. Per interview, it seems that patient and family members are expecting continued recovery of function of RUE and speech and are resistant to exploring assistive technologies at this time. Will have patient back to have a dedicated visit for frank discussion of stroke recovery expectations and goals of care. If insistent on a second opinion, can consider referral to PM&R at that time.

## 2021-08-24 NOTE — Assessment & Plan Note (Signed)
Patient has Rx for shingles vaccine. Plans to have this done later this week.

## 2021-08-28 ENCOUNTER — Other Ambulatory Visit: Payer: Self-pay

## 2021-08-28 ENCOUNTER — Ambulatory Visit: Payer: Medicare Other | Admitting: Occupational Therapy

## 2021-08-28 ENCOUNTER — Ambulatory Visit: Payer: Medicare Other

## 2021-08-28 DIAGNOSIS — R41840 Attention and concentration deficit: Secondary | ICD-10-CM

## 2021-08-28 DIAGNOSIS — R278 Other lack of coordination: Secondary | ICD-10-CM

## 2021-08-28 DIAGNOSIS — I69351 Hemiplegia and hemiparesis following cerebral infarction affecting right dominant side: Secondary | ICD-10-CM

## 2021-08-28 DIAGNOSIS — R4701 Aphasia: Secondary | ICD-10-CM

## 2021-08-28 DIAGNOSIS — R482 Apraxia: Secondary | ICD-10-CM

## 2021-08-28 NOTE — Therapy (Signed)
[facility] [address] [place], [place], [address] Phone: [phone]   Fax:  [phone]  Speech Language Pathology Treatment  Patient Details  Name: Trudie Cervantes MRN: [id number] Date of Birth: 1956-07-21 Referring Provider (SLP): Martyn Malay, MD   Encounter Date: 08/28/2021   End of Session - 08/28/21 1002     Visit Number 13    Number of Visits 25    Date for SLP Re-Evaluation 09/29/21    Authorization Type Medicare/Medicaid    SLP Start Time 1020   delayed as live interpreter was late   SLP Stop Time  1100    SLP Time Calculation (min) 40 min    Activity Tolerance Patient tolerated treatment well             Past Medical History:  Diagnosis Date   Allergy    Anxiety    Brain injury    HX OF TRAUMATIC BRAIN INJURY   Constipation    RARE PER PT   Diabetes mellitus    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   Subjective Assessment - 08/28/21 1009     Subjective icons gone from device with no explanation for how    Patient is accompained by: Family member   husband and interpreter   Currently in Pain? No/denies                   ADULT SLP TREATMENT - 08/28/21 1002       General Information   Behavior/Cognition Alert;Cooperative;Pleasant mood;Requires cueing;Doesn't consistently follow directions      Treatment Provided   Treatment provided Cognitive-Linquistic      Cognitive-Linquistic Treatment   Treatment focused on Aphasia;Apraxia    Skilled  Treatment Returned with device with no icons present again. SLP contacted Tech support to restore and lock device with current icons. Pt and husband filled out questionnaire for device with assistance from interpreter. Family again inquired about securing a permenant device, in which SLP provided education re: Adella Hare process and importance of patient/family demonstrating comprehension and usage of device. Although pt and family has exhibited slow progress with comprehension and modification of device, SLP believes pt would benefit from communication device to optimize ability to communicate as pt unable to verbally communicate with limited use of gestures and writing achieved. Motivation and desire to use [employer] device are positive indicators.      Assessment / Recommendations / Plan   Plan Continue with current plan of care      Progression Toward Goals   Progression toward goals Progressing toward goals   slow progress             SLP Education - 08/28/21 1109     Education Details how to use device daily and trial comprehension    Person(s) Educated Patient;Spouse    Methods Explanation;Demonstration;Verbal cues    Comprehension Verbalized understanding;Returned demonstration;Verbal cues required;Need  further instruction              SLP Short Term Goals - 08/08/21 1010       SLP SHORT TERM GOAL #1   Title Pt will ID and select correct AAC icon with 80% accuracy given choice of 6 over 2 sessions    Baseline 07-17-21    Status Partially Met    Target Date 08/04/21      SLP SHORT TERM GOAL #2   Title Pt will use multimodal communication as able to augment verbal expression for communication of wants/needs given occasional min A over 2 sessions    Baseline 07-28-21, 08-02-21    Status Achieved    Target Date 08/04/21      SLP SHORT TERM GOAL #3   Title Pt will use external communication support for 4/5 opportunities to augment verbal expression given occasional min  A over 2 sessions    Baseline 07-28-21    Status Partially Met    Target Date 08/04/21      SLP SHORT TERM GOAL #4   Title SLP will assess pt abilty to write/type for multimodal communciation in first few ST sessions    Status Achieved    Target Date 08/04/21      SLP SHORT TERM GOAL #5   Title Pt will participate in clinical bedside swallow evaluation as needed if family reports s/sx of dysphagia    Status Deferred    Target Date 08/04/21              SLP Long Term Goals - 08/28/21 1003       SLP LONG TERM GOAL #1   Title Pt will spontaneously use augmentative communication support for 4/5 opportunities to augment verbal expression over 2 sessions    Time 8    Period Weeks    Status On-going    Target Date 09/01/21      SLP LONG TERM GOAL #2   Title Pt will effectively communicate wants/needs/biographical information via augmentative comm system with 90% accuracy given rare min A over 2 sessions    Time 12    Period Weeks    Status On-going    Target Date 09/29/21      SLP LONG TERM GOAL #3   Title Pt/caregiver will indicate less frustration with use of augmentative communication system by last ST session    Time 12    Period Weeks    Status On-going    Target Date 09/29/21              Plan - 08/28/21 1003     Clinical Impression Statement Maudry Diego was referred for OPST intervention secondary to aphasia from stroke in 2014. Pt is Spanish speaking and was accompanied by husband and intepreter this session. SLP contacted Tech support to resolve ongoing issues with current trial device, including deleting all icons and pronunciation issues. SLP provided verbal and visual education on how to increase patient comprehension and communciation through daily practice and targeted trials. SLP anticipates increased comprehension and usage with additional family member educated on how to operate and modify device for communication of patient's wants/needs. Despite slow  progress with device thus far, SLP believes pt would benefit from Jennette communication system as pt will likely not regain any verbal communication with limited non-verbal means to effecively communicate at this time. Pt and family are exhibiting slow yet increasing comprehension with device. SLP recommends skilled ST intervention to address communication to optimize pt's ability  to communicate her wants/needs/etc more effectively.    Speech Therapy Frequency 2x / week    Duration 12 weeks   scheduled 12 weeks to account for scheduling and missed visits; anticiate d/c after 8 weeks after communication system established   Treatment/Interventions Patient/family education;Functional tasks;Multimodal communcation approach;Language facilitation;Compensatory techniques;Internal/external aids;SLP instruction and feedback    Potential to Achieve Goals Fair    Potential Considerations Severity of impairments;Other (comment);Ability to learn/carryover information;Family/community support   time since onset   Consulted and Agree with Plan of Care Patient;Family member/caregiver    Family Member Consulted granddaughter & husband             Patient will benefit from skilled therapeutic intervention in order to improve the following deficits and impairments:   Aphasia  Verbal apraxia    Problem List Patient Active Problem List   Diagnosis Date Noted   Dizziness 09/19/2020   Urinary frequency 09/19/2020   Encounter for counseling regarding advance directives 10/02/2019   Healthcare maintenance 08/05/2019   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 [place], [place], [address] Phone: [phone]   Fax:  [phone]   Name: Arizbeth Cawthorn MRN: [id number] Date of Birth: 09-28-1956

## 2021-08-28 NOTE — Therapy (Signed)
[facility] [address] [place], [place], [address] Phone: [phone]   Fax:  [phone]  Occupational Therapy Treatment  Patient Details  Name: Amy [name] MRN: [id number] Date of Birth: 09-Jan-1956 Referring Provider (OT): Dr. Owens Shark   Encounter Date: 08/28/2021   OT End of Session - 08/28/21 1230     Visit Number 10    Number of Visits 25    Date for OT Re-Evaluation 09/27/21    Authorization Type Medicare/ Medicaid    Authorization Time Period POC written for 12 weeks may d/c after 8 dependent on progress.    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   Subjective Assessment - 08/28/21 1105     Patient is accompanied by: Family member;Interpreter    Pertinent History 65yo female with a history of hypertension, T2DM, hemorrhagic stroke (2015) with residual R sided deficit, seizure, anxiety, aphasia, hyperlipidemia, hx of TBI    Limitations h/o seizure 7 years ago    Patient Stated Goals be more independent     Currently in Pain? No/denies             Pt has missed 2 weeks of therapy - discussed making up sessions from last week due to illness. Pt/husband agreed to add 2 more visits following this week.  Also clarified miscommunication re: possible A/E for cutting cooked food w/ pt/husband via interpreter (dtr-n-law refused to come back to gym)  AA/ROM BUE's in low and mid ranges seated, and supine in mid to high level ranges w/ assist to maintain hand on dowel. Recommended use of coban and shown how for home use to help hand grip dowel.  Seated: Wt bearing over RUE w/ reaching activities LUE.   Reviewed previous HEP's for NMR RUE                       OT Short Term Goals - 08/08/21 1208       OT SHORT TERM GOAL #1   Title I with HEP    Time 4    Period Weeks    Status Achieved    Target Date 08/02/21      OT SHORT TERM GOAL #2  Title Pt will perform bathing with supervision    Time 4    Period Weeks    Status On-going   min assist for back and feet (pt/family issued handout for LH sponge and bath mitt)     OT SHORT TERM GOAL #3   Title Pt will use RUE as a stabilizer/ gross A at least 25% of the time.    Time 4    Period Weeks    Status On-going   not consistent     OT SHORT TERM GOAL #4   Title Pt will demonstrate ability to grasp/ relase a 2 inch block 2/5 trials.    Time 4    Period Weeks    Status On-going   Pt requires mod facilitation/assist at wrist and fingers     OT SHORT TERM GOAL #5   Title Pt will consistently perform light home management with no more than min A.    Time 4    Period Weeks    Status On-going   not consistent              OT Long Term Goals - 08/08/21 1208       OT LONG TERM GOAL #1   Title I with updated HEP.    Time 12    Period Weeks    Status On-going      OT LONG TERM GOAL #2   Title Pt will perform all basic ADLS modified independently    Time 12    Period Weeks    Status On-going      OT LONG  TERM GOAL #3   Title Pt will perform basic home management modified independently    Time 12    Period Weeks    Status On-going      OT LONG TERM GOAL #4   Title Pt will grasp/ release a 1 inch block 4/5 trials with RUE    Time 12    Period Weeks    Status On-going      OT LONG TERM GOAL #5   Title Pt will use RUE as a stabilizer/ gross A at least 40% of the time for ADLs/IADLs.    Time 12    Period Weeks    Status On-going      OT LONG TERM GOAL #6   Title Pt will demonstrate A/ROM shoulder flexion of 120* in prep for functional reach    Time 12    Period Weeks    Status On-going                   Plan - 08/28/21 1231     Clinical Impression Statement This 10th progress note is for dates: 07/05/21 - 08/28/21.  Pt is progressing towards goals.She remains limited by R inattention, and aphasia. Pt has improve w/ shoulder and elbow control and ROM, however limited distally w/ forearm rotation, wrist extension, and hand movement. Anticipate d/c in next 2-3 visits.    OT Occupational Profile and History Detailed Assessment- Review of Records and additional review of physical, cognitive, psychosocial history related to current functional performance    Occupational performance deficits (Please refer to evaluation for details): ADL's;IADL's;Leisure;Play;Social Participation    Body Structure / Function / Physical Skills ADL;Endurance;UE functional use;Balance;Pain;Flexibility;FMC;ROM;GMC;Coordination;Decreased knowledge of precautions;Sensation;Decreased knowledge of use of DME;IADL;Dexterity;Strength;Tone;Mobility    Cognitive Skills Attention;Problem Solve;Safety Awareness;Thought    Rehab Potential Good    Clinical Decision Making Several treatment options, min-mod task modification necessary  increased time, interpreter, demonstration due to aphasia   Comorbidities Affecting Occupational Performance: May have comorbidities impacting occupational performance   hypertension,  T2DM, hemorrhagic stroke (2015) with residual R sided deficit, seizure, anxiety, aphasia, hyperlipidemia. Dizziness   Modification or Assistance to Complete Evaluation  Min-Moderate modification of tasks or assist with assess necessary to complete eval   aphasia, inattention, increased time, lenght of time since onset   OT Frequency 2x / week   plus eval   OT Duration 12 weeks    OT Treatment/Interventions Self-care/ADL training;Ultrasound;Visual/perceptual remediation/compensation;Patient/family education;DME and/or AE instruction;Paraffin;Passive range of motion;Balance training;Fluidtherapy;Cryotherapy;Splinting;Moist Heat;Therapeutic exercise;Manual Therapy;Therapeutic activities;Cognitive remediation/compensation;Electrical Stimulation;Neuromuscular education    Plan Begin assessing goals in prep for d/c by 09/19/21.    Consulted and Agree with Plan of Care Patient;Family member/caregiver   interpreter            Patient will benefit from skilled therapeutic intervention in order to improve the following deficits and impairments:   Body Structure / Function / Physical Skills: ADL, Endurance, UE functional use, Balance, Pain, Flexibility, FMC, ROM, GMC, Coordination, Decreased knowledge of precautions, Sensation, Decreased knowledge of use of DME, IADL, Dexterity, Strength, Tone, Mobility Cognitive Skills: Attention, Problem Solve, Safety Awareness, Thought     Visit Diagnosis: Hemiplegia and hemiparesis following cerebral infarction affecting right dominant side (HCC)  Attention and concentration deficit  Other lack of coordination    Problem List Patient Active Problem List   Diagnosis Date Noted   Dizziness 09/19/2020   Urinary frequency 09/19/2020   Encounter for counseling regarding advance directives 10/02/2019   Healthcare maintenance 08/05/2019   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 [place], [place], [address] Phone: [phone]   Fax:  [phone]  Name: Amy [name] MRN: [id number] Date of Birth: Mar 22, 1956

## 2021-09-01 ENCOUNTER — Ambulatory Visit: Payer: Medicare Other

## 2021-09-01 ENCOUNTER — Other Ambulatory Visit: Payer: Self-pay

## 2021-09-01 ENCOUNTER — Ambulatory Visit: Payer: Medicare Other | Admitting: Occupational Therapy

## 2021-09-01 DIAGNOSIS — R4701 Aphasia: Secondary | ICD-10-CM

## 2021-09-01 DIAGNOSIS — R41840 Attention and concentration deficit: Secondary | ICD-10-CM

## 2021-09-01 DIAGNOSIS — M6281 Muscle weakness (generalized): Secondary | ICD-10-CM

## 2021-09-01 DIAGNOSIS — I69351 Hemiplegia and hemiparesis following cerebral infarction affecting right dominant side: Secondary | ICD-10-CM

## 2021-09-01 DIAGNOSIS — R482 Apraxia: Secondary | ICD-10-CM

## 2021-09-01 DIAGNOSIS — R278 Other lack of coordination: Secondary | ICD-10-CM

## 2021-09-01 DIAGNOSIS — R2681 Unsteadiness on feet: Secondary | ICD-10-CM

## 2021-09-01 NOTE — Therapy (Signed)
[facility] [address] [place], [place], [address] Phone: [phone]   Fax:  [phone]  Occupational Therapy Treatment  Patient Details  Name: Amy [name] MRN: [id number] Date of Birth: 10-01-1956 Referring Provider (OT): Dr. Owens Shark   Encounter Date: 09/01/2021   OT End of Session - 09/01/21 1402     Visit Number 11    Number of Visits 25    Date for OT Re-Evaluation 09/27/21    Authorization Type Medicare/ Medicaid    Authorization Time Period POC written for 12 weeks may d/c after 8 dependent on progress.    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   Pain- Pt denies pain                      OT  Treatment/ Education - 09/01/21 1405     Education Details checked progress towards goals with assist from [place] interpreter. Reviewed table slides, supine shoulder flexion with cane with hand wrapped with ace bandage, weight bearing through RUE edge of mat, grasp/release of block    Person(s)  Educated Patient;Spouse    Methods Explanation;Demonstration;Verbal cues    Comprehension Verbalized understanding;Returned demonstration;Verbal cues required              OT Short Term Goals - 09/01/21 1107       OT SHORT TERM GOAL #1   Title I with HEP    Time 4    Period Weeks    Status Achieved    Target Date 08/02/21      OT SHORT TERM GOAL #2   Title Pt will perform bathing with supervision    Time 4    Period Weeks    Status Not Met   min assist for back and feet (pt/family issued handout for LH sponge and bath mitt)     OT SHORT TERM GOAL #3   Title Pt will use RUE as a stabilizer/ gross A at least 25% of the time.    Time 4    Period Weeks    Status Achieved   met per husband     OT SHORT TERM GOAL #4   Title Pt will demonstrate ability  to grasp/ relase a 2 inch block 2/5 trials.    Time 4    Period Weeks    Status Not Met   not consistent, able to pick up 3 blocks but unable to release     OT SHORT TERM GOAL #5   Title Pt will consistently perform light home management with no more than min A.    Time 4    Period Weeks    Status Achieved   pt perfroms laundry at a supervision level and she wipes down table              OT Long Term Goals - 09/01/21 1120       OT LONG TERM GOAL #1   Title I with updated HEP.    Time 12    Period Weeks    Status Achieved   cane in supine     OT LONG TERM GOAL #2   Title Pt will perform all basic ADLS modified independently    Time 12    Period Weeks    Status Partially Met   min A for bathing, mod I for dressing     OT LONG TERM GOAL #3   Title Pt will perform basic home management modified independently    Time 12    Period Weeks    Status Not Met      OT LONG TERM GOAL #4   Title Pt will grasp/ release a 1 inch block 4/5 trials with RUE    Time 12    Period Weeks    Status Not Met   not consistent, able to grasp but not release     OT LONG TERM GOAL #5   Title Pt will use RUE as a stabilizer/  gross A at least 40% of the time for ADLs/IADLs.    Time 12    Period Weeks    Status Not Met   uses grossly 25%     OT LONG TERM GOAL #6   Title Pt will demonstrate A/ROM shoulder flexion of 120* in prep for functional reach    Time 12    Period Weeks    Status Achieved   125                  Plan - 09/01/21 1113     Clinical Impression Statement Pt has made progress towards goals. She has not fully achieved all goals due to the severity of deficits, aphasia, and length of time since onset. Pt/ husband request d/c from OT today as they are going to [place]. Medical laboratory scientific officer interpreter used)    OT Occupational Profile and History Detailed Assessment- Review of Records and additional review of physical, cognitive, psychosocial history related to current functional performance    Occupational performance deficits (Please refer to evaluation for details): ADL's;IADL's;Leisure;Play;Social Participation    Body Structure / Function / Physical Skills ADL;Endurance;UE functional use;Balance;Pain;Flexibility;FMC;ROM;GMC;Coordination;Decreased knowledge of precautions;Sensation;Decreased knowledge of use of DME;IADL;Dexterity;Strength;Tone;Mobility    Cognitive Skills Attention;Problem Solve;Safety Awareness;Thought    Rehab Potential Good    Clinical Decision Making Several treatment options, min-mod task modification necessary   increased time, interpreter, demonstration due to aphasia   Comorbidities Affecting Occupational Performance: May have comorbidities impacting occupational performance   hypertension, T2DM, hemorrhagic stroke (2015) with residual R sided deficit, seizure, anxiety, aphasia, hyperlipidemia. Dizziness   Modification or Assistance to Complete Evaluation  Min-Moderate modification of tasks or assist with assess necessary to complete eval   aphasia, inattention, increased  time, lenght of time since onset   OT Frequency 2x / week   plus eval   OT Duration 12 weeks    OT  Treatment/Interventions Self-care/ADL training;Ultrasound;Visual/perceptual remediation/compensation;Patient/family education;DME and/or AE instruction;Paraffin;Passive range of motion;Balance training;Fluidtherapy;Cryotherapy;Splinting;Moist Heat;Therapeutic exercise;Manual Therapy;Therapeutic activities;Cognitive remediation/compensation;Electrical Stimulation;Neuromuscular education    Plan d/c OT per pt husband request, pt's dtr was not present.- [place] interpreter used    Consulted and Agree with Plan of Care Patient;Family member/caregiver   interpreter- stratus            Patient will benefit from skilled therapeutic intervention in order to improve the following deficits and impairments:   Body Structure / Function / Physical Skills: ADL, Endurance, UE functional use, Balance, Pain, Flexibility, FMC, ROM, GMC, Coordination, Decreased knowledge of precautions, Sensation, Decreased knowledge of use of DME, IADL, Dexterity, Strength, Tone, Mobility Cognitive Skills: Attention, Problem Solve, Safety Awareness, Thought     Visit Diagnosis: Hemiplegia and hemiparesis following cerebral infarction affecting right dominant side (HCC)  Attention and concentration deficit  Other lack of coordination  Muscle weakness (generalized)  Unsteadiness on feet  OCCUPATIONAL THERAPY DISCHARGE SUMMARY   Current functional level related to goals / functional outcomes: Pt has made overall progress but she did not fully achieve goals due to severity of deficits and length of time since onset   Remaining deficits: Hemiplegia, communication deficits, cognitive deficits   Education / Equipment: Pt/ family were educated regarding HEP and adapted strategies for ADLS. They verbalize understanding via interpreter.   Patient agrees to discharge. Patient goals were partially met. Patient is being discharged due to the patient's request. Pt is going to [place]. .     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) ([facility]) 09/19/2015   Right facial numbness 02/09/2015   Anxiety state 10/28/2014   Language barrier to communication 10/28/2014   Cephalalgia    Change in vision 03/02/2014   Seizures ([facility]) 02/24/2014   Hemorrhagic stroke ([facility]) 02/16/2014   Essential hypertension 02/16/2014   DM type 2 (diabetes mellitus, type 2) ([facility]) 02/16/2014   Aphasia as late effect of stroke 02/16/2014   Paralysis ([facility]) 02/16/2014   Hyperlipidemia 02/16/2014    Mertice Uffelman, OT/L 09/01/2021, 2:07 PM Theone Murdoch, OTR/L [phone] Phone: [phone] 2:08 PM 09/01/21  [facility] [address] [place], [place], [address] Phone: [phone]   Fax:  [phone]  Name: Amy [name] MRN: [id number] Date of Birth: [birth year]

## 2021-09-01 NOTE — Therapy (Signed)
[facility] [address] [place], [place], [address] Phone: [phone]   Fax:  [phone]  Speech Language Pathology Treatment/Discharge Summary  Patient Details  Name: Amy [name] MRN: [id number] Date of Birth: April 26, 1956 Referring Provider (SLP): Martyn Malay, MD   Encounter Date: 09/01/2021   End of Session - 09/01/21 1015     Visit Number 14    Number of Visits 25    Date for SLP Re-Evaluation 09/29/21    Authorization Type Medicare/Medicaid    SLP Start Time 1015    SLP Stop Time  1100    SLP Time Calculation (min) 45 min    Activity Tolerance Patient tolerated treatment well             Past Medical History:  Diagnosis Date   Allergy    Anxiety    Brain injury    HX OF TRAUMATIC BRAIN INJURY   Constipation    RARE PER PT   Diabetes mellitus    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   Subjective Assessment - 09/01/21 1038     Subjective "I think it is best for [place] to cancel"    Patient is accompained by: Family member   husband and video interpreter   Currently in Pain? No/denies             SPEECH THERAPY DISCHARGE SUMMARY  Visits from Start of Care: 14  Current functional level related to goals / functional outcomes: Today, patient's husband reported they no longer wanted to receive a permanent [employer] speech-generating device despite SLP recommendation earlier this week. Family will be leaving country for unknown amount of time and husband expressed  concern for troubleshooting the device while away. SLP reiterated family has been given multiple resources with [employer] information and designated consultant's contact information to contact with any problems/concerns. Pt's husband also indicated concern that pt is now "stressed" using communication device as pt has not yet mastered how to use device. SLP again educated and recommended patient and family would benefit from more education, training, and familiarization with the device due to severity of patient's deficits and limited carryover from family at home. Husband indicated understanding but again declined permanent [employer] device and requested ST discharge this date. Per family request, SLP contacted [employer] with family request to cancel permanent device and SLP completed ST discharge.    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 Patient goals were not met. Patient is being discharged due to the patient's request..         ADULT SLP TREATMENT - 09/01/21 1038       General Information   Behavior/Cognition Alert;Cooperative;Pleasant mood;Requires cueing;Doesn't follow directions      Treatment Provided   Treatment  provided Cognitive-Linquistic      Cognitive-Linquistic Treatment   Treatment focused on Aphasia;Apraxia    Skilled Treatment Pt returned with device, stylus, and charger. Husband present as well as video Ambulance person (live interpreter did not show). Pt's husband stated "I think it is best for [place] to cancel the device." Family reportedly traveling to [place] for an unknown amount of time ("maybe 3 or 4 month"). Husband stated "we want to cancel in case we have any problems". This is highly unsual as family (both husband and  daughter in law) having been requesting to pay for device out of pocket if insurance did not cover. SLP explained last session SLP would recommend for patient to receive a permanent device but expected patient and family needed more education and training due to reduced carryover at home and inconsistent comprehension due to severity of pt deficits. Husband stated "it causes her stress and she doesn't understand how to use it." SLP had never heard this remark nor has family ever expressed negative concern for device. Pt's husband endorsed "we just noticed it." SLP again educated them on comprehension will develop with more education, training, and usage of device. SLP again requested verbal confirmation of family desires, in which pt's husband confirmed they did not want to receive a [employer] device or continue with ST at this time. SLP contacted [facility] consult with this information. Husband requested ST discharge and cancellation of remaining ST visits. SLP verbalized understanding and communicated discharge summary would be completed and [employer] would be notified. Pt's husband declined phone conversation with bilingual [employer] representative to further explain or discuss concerns. SLP will return device, stylus, and charger and be in further contact with [employer].      Assessment / Recommendations / Plan   Plan Discharge SLP treatment due to (comment)   family request     Progression Toward Goals   Progression toward goals Goals not met, education completed, patient discharged from [facility] - 09/01/21 [place]     Education Details recommendation for permanent device, expectations for reduced comprehension, discharge summary per patient request    Person(s) Educated Patient;Spouse    Methods Explanation;Demonstration    Comprehension Verbalized understanding              SLP Short Term Goals - 08/08/21 1010       SLP SHORT TERM GOAL #1   Title Pt  will ID and select correct AAC icon with 80% accuracy given choice of 6 over 2 sessions    Baseline 07-17-21    Status Partially Met    Target Date 08/04/21      SLP SHORT TERM GOAL #2   Title Pt will use multimodal communication as able to augment verbal expression for communication of wants/needs given occasional min A over 2 sessions    Baseline 07-28-21, 08-02-21    Status Achieved    Target Date 08/04/21      SLP SHORT TERM GOAL #3   Title Pt will use external communication support for 4/5 opportunities to augment verbal expression given occasional min A over 2 sessions    Baseline 07-28-21    Status Partially Met    Target Date 08/04/21      SLP SHORT TERM GOAL #4   Title SLP will assess pt abilty to write/type for multimodal communciation in first few ST sessions    Status Achieved    Target Date 08/04/21  SLP SHORT TERM GOAL #5   Title Pt will participate in clinical bedside swallow evaluation as needed if family reports s/sx of dysphagia    Status Deferred    Target Date 08/04/21              SLP Long Term Goals - 09/01/21 1016       SLP LONG TERM GOAL #1   Title Pt will spontaneously use augmentative communication support for 4/5 opportunities to augment verbal expression over 2 sessions    Time 8    Period Weeks    Status Not Met    Target Date 09/01/21      SLP LONG TERM GOAL #2   Title Pt will effectively communicate wants/needs/biographical information via augmentative comm system with 90% accuracy given rare min A over 2 sessions    Time 12    Period Weeks    Status Not Met    Target Date 09/29/21      SLP LONG TERM GOAL #3   Title Pt/caregiver will indicate less frustration with use of augmentative communication system by last ST session    Time 12    Period Weeks    Status Not Met    Target Date 09/29/21              Plan - 09/01/21 1015     Clinical Impression Statement Amy [name] was referred for OPST intervention secondary to  aphasia from stroke in 2014. Pt is Spanish speaking and was accompanied by husband and video interpreter this session. Pt's husband reported pt and family no longer want to receive [employer] device due to unknown travel plans out of the country, concerns with tech management, and new onset of stress of operating device. SLP provided extensive education re: communication abilities with permament device, benefit of further education and training to optimize understanding, and concern that no negative associated emotions have been reported, only pt and family desire to acquire device. Pt's husband verbalized understanding of SLP education but continued to decline permament [employer] device and more ST intervention. Pt and family aware they can re-consult ST after return from [place] if they would like to retrial Ling[employer] or trial different communication systems. Both indicated understanding.    Speech Therapy Frequency 2x / week    Duration 12 weeks   scheduled 12 weeks to account for scheduling and missed visits; anticiate d/c after 8 weeks after communication system established   Treatment/Interventions Patient/family education;Functional tasks;Multimodal communcation approach;Language facilitation;Compensatory techniques;Internal/external aids;SLP instruction and feedback    Potential to Achieve Goals Fair    Potential Considerations Severity of impairments;Other (comment);Ability to learn/carryover information;Family/community support   time since onset   Consulted and Agree with Plan of Care Patient;Family member/caregiver    Family Member Consulted granddaughter & husband             Patient will benefit from skilled therapeutic intervention in order to improve the following deficits and impairments:   Aphasia  Verbal apraxia    Problem List Patient Active Problem List   Diagnosis Date Noted   Dizziness 09/19/2020   Urinary frequency 09/19/2020   Encounter for counseling regarding  advance directives 10/02/2019   Healthcare maintenance 08/05/2019   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 [place], [place], [address] Phone: [phone]   Fax:  [phone]   Name: Nereida Schepp MRN: [id number] Date of Birth: [birth year]

## 2021-09-04 ENCOUNTER — Ambulatory Visit: Payer: Medicare Other | Admitting: Occupational Therapy

## 2021-09-14 ENCOUNTER — Ambulatory Visit (INDEPENDENT_AMBULATORY_CARE_PROVIDER_SITE_OTHER): Payer: Medicare Other | Admitting: Student

## 2021-09-14 ENCOUNTER — Other Ambulatory Visit: Payer: Self-pay

## 2021-09-14 ENCOUNTER — Encounter: Payer: Self-pay | Admitting: Student

## 2021-09-14 VITALS — BP 139/65 | HR 94 | Ht 61.0 in | Wt 128.1 lb

## 2021-09-14 DIAGNOSIS — Z23 Encounter for immunization: Secondary | ICD-10-CM | POA: Diagnosis not present

## 2021-09-14 DIAGNOSIS — I619 Nontraumatic intracerebral hemorrhage, unspecified: Secondary | ICD-10-CM

## 2021-09-14 DIAGNOSIS — R300 Dysuria: Secondary | ICD-10-CM

## 2021-09-14 DIAGNOSIS — Z0000 Encounter for general adult medical examination without abnormal findings: Secondary | ICD-10-CM

## 2021-09-14 LAB — POCT URINALYSIS DIP (CLINITEK)
Bilirubin, UA: NEGATIVE
Blood, UA: NEGATIVE
Glucose, UA: >=1000 mg/dL — AB
Ketones, POC UA: NEGATIVE mg/dL
Leukocytes, UA: NEGATIVE
Nitrite, UA: NEGATIVE
POC PROTEIN,UA: NEGATIVE
Spec Grav, UA: 1.01 (ref 1.010–1.025)
Urobilinogen, UA: 0.2 E.U./dL
pH, UA: 6 (ref 5.0–8.0)

## 2021-09-14 MED ORDER — FLUCONAZOLE 150 MG PO TABS: 150.0000 mg | ORAL_TABLET | Freq: Once | ORAL | 0 refills | Status: AC

## 2021-09-14 NOTE — Patient Instructions (Signed)
Melina Fiddler Es una alegra cuidarte! Gracias por venir hoy.   Como recordatorio, aqu hay un resumen de lo que hablamos hoy:  - Parece que tiene una irritacin alrededor de la abertura uretral, a menudo esto es causado por una infeccin por levaduras. Estoy enviando un medicamento a su farmacia que debera ayudar con esto. Es Ardelia Mems dosis nica de una sola vez.  - Estar atento a posibles estudios experimentales o terapias para las que pueda ser candidato. Aunque como discutimos, quiero que ests preparado para la probabilidad de que no puedas recuperar el habla verbal. Eres muy bueno comunicando tus necesidades y deseos y Merchant navy officer don de una familia maravillosamente solidaria. Los animo a todos a seguir trabajando juntos para Equities trader lneas de comunicacin.   Cudese y busque atencin inmediata antes si desarrolla alguna inquietud.      It is such a joy to take care you! Thank you for coming in today.   As a reminder, here is a recap of what we talked about today:  - You seem to have an irritation around your urethral opening, often this is caused by a yeast infection. I am sending a medicine to your pharmacy that should help with this. It is a one-time single dose.  - I will keep an eye out for possible experimental studies or therapies that you may be a candidate for. Though as we discussed, I want you to be prepared for the likelihood that you will not be able to regain verbal speech. You are very good at communicating your needs and desires and have the gift of a wonderfully supportive family. I encourage you all to continue working together to smooth out these lines of communication.    Take care and seek immediate care sooner if you develop any concerns.   Marnee Guarneri, MD [facility]

## 2021-09-14 NOTE — Assessment & Plan Note (Signed)
Flu shot today 

## 2021-09-14 NOTE — Progress Notes (Signed)
SUBJECTIVE:   CHIEF COMPLAINT / HPI:   Stroke Follow-Up and Goals of Care: Patient presents today with her daughter-in-law to discuss expectations for stroke recovery. Patient had a catastrophic hemorrhagic stroke in 2015 and has since regained the ability to walk but has not recovered use of her R hand or expressive language. Her receptive language is intact. Her family members express frustration that they feel her therapists/physicians have "given up" on her regaining the ability to talk or use her R hand. Her husband became so frustrated that he withdrew her from therapy and stated that he would take her to [place] for therapy instead.  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 We also discussed that the majority of stroke recovery happens between 6 and 12 months and that, at this point, there is little to no possibility of meaningful recovery of function.  Her daughter-in-law voiced understanding but also shared that she remains hopeful and expressed an interest in pursuing experimental therapies if her mother-in-law were to qualify for any ongoing studies.  Dysuria: Patient reports five days of burning with urination. Symptoms came on suddenly and occur with every urination. Denies urinary frequency, pelvic pain, or flank pain. Has been on Jardiance for several years but has not had any issues. She is not sexually active.   Health Maintenance: Patient would like a flu shot today.   PERTINENT  PMH / PSH: Hemorrhagic Stroke 2015  OBJECTIVE:   BP 139/65   Pulse 94   Ht 5\' 1"  (1.549 m)   Wt 128 lb 2 oz (58.1 kg)   SpO2 99%   BMI 24.21 kg/m   Physical Exam Vitals reviewed.  HENT:     Head:     Comments: S/p craniectomy on L side Pulmonary:     Effort: Pulmonary effort is normal.  Abdominal:     Comments: Without suprapubic tenderness. No CVA  tenderness.   Neurological:     Comments: Able to verbalize the name "Amy [name]," otherwise aphasic. RUE contracted. Hand squeeze 5/5 on L, 1/5 on R. Gait steady.    Urinalysis    Component Value Date/Time   COLORURINE YELLOW 10/25/2014 2233   APPEARANCEUR CLEAR 10/25/2014 2233   LABSPEC 1.029 10/25/2014 2233   PHURINE 7.0 10/25/2014 2233   GLUCOSEU >1000 (A) 10/25/2014 2233   HGBUR NEGATIVE 10/25/2014 2233   BILIRUBINUR negative 09/14/2021 1354   KETONESUR negative 09/14/2021 1354   KETONESUR NEGATIVE 10/25/2014 2233   PROTEINUR negative 09/19/2020 1204   PROTEINUR NEGATIVE 10/25/2014 2233   UROBILINOGEN 0.2 09/14/2021 1354   UROBILINOGEN 1.0 10/25/2014 2233   NITRITE Negative 09/14/2021 1354   NITRITE NEGATIVE 10/25/2014 2233   LEUKOCYTESUR Negative 09/14/2021 1354      ASSESSMENT/PLAN:   Hemorrhagic stroke Discussed with patient and her daughter-in-law that minimal likelihood of her making meaningful recovery of her speech. Patient and daughter-in-law indicate understanding but would like to pursue future experimental therapies if they come available. I indicated to them that I would search for opportunities, but prepared them for the low likelihood that she would qualify for any such studies.  - Encouraged them to continue to pursue therapies offering assistive devices/technologies.   Healthcare maintenance Flu shot today  Dysuria UA not suggestive of UTI. External dysuria in the setting of Jardiance use suggests possible yeast infection. Will treat empirically with Diflucan x1. If no improvement, will have her  back for pelvic exam.      Amy [name], [place]

## 2021-09-14 NOTE — Assessment & Plan Note (Signed)
Discussed with patient and her daughter-in-law that minimal likelihood of her making meaningful recovery of her speech. Patient and daughter-in-law indicate understanding but would like to pursue future experimental therapies if they come available. I indicated to them that I would search for opportunities, but prepared them for the low likelihood that she would qualify for any such studies.  - Encouraged them to continue to pursue therapies offering assistive devices/technologies.

## 2021-09-14 NOTE — Assessment & Plan Note (Signed)
UA not suggestive of UTI. External dysuria in the setting of Jardiance use suggests possible yeast infection. Will treat empirically with Diflucan x1. If no improvement, will have her back for pelvic exam.

## 2021-09-19 ENCOUNTER — Encounter: Payer: Medicare Other | Admitting: Occupational Therapy

## 2021-11-07 ENCOUNTER — Other Ambulatory Visit: Payer: Self-pay | Admitting: *Deleted

## 2021-11-07 DIAGNOSIS — E785 Hyperlipidemia, unspecified: Secondary | ICD-10-CM

## 2021-11-07 DIAGNOSIS — Z794 Long term (current) use of insulin: Secondary | ICD-10-CM

## 2021-11-07 DIAGNOSIS — I10 Essential (primary) hypertension: Secondary | ICD-10-CM

## 2021-11-07 MED ORDER — LISINOPRIL 5 MG PO TABS: ORAL_TABLET | ORAL | 3 refills | Status: DC

## 2021-11-07 MED ORDER — ROSUVASTATIN CALCIUM 10 MG PO TABS: 10.0000 mg | ORAL_TABLET | Freq: Every day | ORAL | 3 refills | Status: DC

## 2021-11-07 MED ORDER — METFORMIN HCL 1000 MG PO TABS: 1000.0000 mg | ORAL_TABLET | Freq: Two times a day (BID) | ORAL | 3 refills | Status: DC

## 2021-11-07 MED ORDER — EMPAGLIFLOZIN 10 MG PO TABS: 10.0000 mg | ORAL_TABLET | Freq: Every day | ORAL | 3 refills | Status: DC

## 2021-11-07 NOTE — Telephone Encounter (Signed)
Received voicemail from son stating that patient needed a refill on her medications.  Medications pended and sent to provider.  Cesar Rogerson,CMA

## 2022-01-31 ENCOUNTER — Other Ambulatory Visit: Payer: Self-pay | Admitting: *Deleted

## 2022-01-31 DIAGNOSIS — E119 Type 2 diabetes mellitus without complications: Secondary | ICD-10-CM

## 2022-01-31 NOTE — Telephone Encounter (Signed)
Son called stating that patient needs a refill on her jardiance.  Refills are on file at [facility] (confirmed).  Son is aware and will pick up the refill today.  Dustie Brittle,CMA ? ?

## 2022-06-13 ENCOUNTER — Other Ambulatory Visit: Payer: Self-pay | Admitting: Student

## 2022-06-13 DIAGNOSIS — R569 Unspecified convulsions: Secondary | ICD-10-CM

## 2022-11-24 ENCOUNTER — Other Ambulatory Visit: Payer: Self-pay | Admitting: Student

## 2022-11-24 DIAGNOSIS — E119 Type 2 diabetes mellitus without complications: Secondary | ICD-10-CM

## 2022-11-24 DIAGNOSIS — I10 Essential (primary) hypertension: Secondary | ICD-10-CM

## 2022-12-08 ENCOUNTER — Other Ambulatory Visit: Payer: Self-pay | Admitting: Student

## 2022-12-08 DIAGNOSIS — E119 Type 2 diabetes mellitus without complications: Secondary | ICD-10-CM

## 2022-12-30 ENCOUNTER — Other Ambulatory Visit: Payer: Self-pay | Admitting: Student

## 2022-12-30 DIAGNOSIS — E119 Type 2 diabetes mellitus without complications: Secondary | ICD-10-CM

## 2022-12-30 DIAGNOSIS — E785 Hyperlipidemia, unspecified: Secondary | ICD-10-CM

## 2023-01-06 ENCOUNTER — Other Ambulatory Visit: Payer: Self-pay | Admitting: Student

## 2023-01-06 DIAGNOSIS — E119 Type 2 diabetes mellitus without complications: Secondary | ICD-10-CM

## 2023-07-04 ENCOUNTER — Ambulatory Visit (INDEPENDENT_AMBULATORY_CARE_PROVIDER_SITE_OTHER): Payer: Medicare Other | Admitting: Student

## 2023-07-04 VITALS — BP 127/68 | HR 78 | Ht 61.0 in | Wt 126.0 lb

## 2023-07-04 DIAGNOSIS — E785 Hyperlipidemia, unspecified: Secondary | ICD-10-CM | POA: Diagnosis not present

## 2023-07-04 DIAGNOSIS — E119 Type 2 diabetes mellitus without complications: Secondary | ICD-10-CM

## 2023-07-04 DIAGNOSIS — Z23 Encounter for immunization: Secondary | ICD-10-CM

## 2023-07-04 DIAGNOSIS — Z794 Long term (current) use of insulin: Secondary | ICD-10-CM | POA: Diagnosis not present

## 2023-07-04 DIAGNOSIS — R569 Unspecified convulsions: Secondary | ICD-10-CM | POA: Diagnosis not present

## 2023-07-04 DIAGNOSIS — I10 Essential (primary) hypertension: Secondary | ICD-10-CM

## 2023-07-04 LAB — POCT GLYCOSYLATED HEMOGLOBIN (HGB A1C): HbA1c, POC (controlled diabetic range): 7.7 % — AB (ref 0.0–7.0)

## 2023-07-04 MED ORDER — LISINOPRIL 5 MG PO TABS: ORAL_TABLET | ORAL | 3 refills | Status: DC

## 2023-07-04 MED ORDER — LEVETIRACETAM 500 MG PO TABS: 500.0000 mg | ORAL_TABLET | Freq: Two times a day (BID) | ORAL | 0 refills | Status: DC

## 2023-07-04 MED ORDER — ROSUVASTATIN CALCIUM 10 MG PO TABS: 10.0000 mg | ORAL_TABLET | Freq: Every day | ORAL | 3 refills | Status: DC

## 2023-07-04 NOTE — Patient Instructions (Addendum)
It was great to see you today! Thank you for choosing [facility] for your primary care.  Today we addressed: Thank you for getting your flu and pneumonia vaccine. We are checking labs today for diabetes and hypertension. I have sent a referral to neurology for you to be seen for your seizures with history of stroke.  1. Gracias por vacunarse contra la gripe y la neumona. 2. Hoy estamos revisando los laboratorios para detectar diabetes e hipertensin. 3. He enviado una derivacin a neurologa para que lo [place] por sus convulsiones con antecedentes de accidente cerebrovascular.  If you haven't already, sign up for My Chart to have easy access to your labs results, and communication with your primary care physician. We are checking some labs today. If they are abnormal, I will call you. If they are normal, I will send you a MyChart message (if it is active) or a letter in the mail. If you do not hear about your labs in the next 2 weeks, please call the office. Return in about 3 months (around 10/03/2023) for Diabetes follow-up. Please arrive 15 minutes before your appointment to ensure smooth check in process.  We appreciate your efforts in making this happen.  Thank you for allowing me to participate in your care, Shelby Mattocks, DO 07/04/2023, 1:54 PM PGY-3, [facility]

## 2023-07-04 NOTE — Progress Notes (Signed)
  SUBJECTIVE:   CHIEF COMPLAINT / HPI:   Presents today requesting medication refill. Requesting refills of Keppra which she takes 500mg  BID. She ran out of the medication yesterday. Also requests refills of lisinopril and rosuvastatin.  Of note, patient has not been seen by [place] since 2022.  Spanish video interpreter utilized throughout encounter.  Patient is unable to speak secondary to stroke, her daughter is speaking for her.  PERTINENT  PMH / PSH: HTN, T2DM, HLD, seizures, hemorrhagic stroke  OBJECTIVE:  BP 127/68   Pulse 78   Ht 5\' 1"  (1.549 m)   Wt 126 lb (57.2 kg)   SpO2 97%   BMI 23.81 kg/m  General: Well-appearing, NAD CV: RRR, no murmurs auscultated Pulm: CTAB, normal WOB  ASSESSMENT/PLAN:   Assessment & Plan Essential hypertension BP: 127/68 today. Well controlled. Goal of <130/80. Medication regimen: Lisinopril 5 mg daily. Check CMP. Seizures [facility]) Referral sent to [facility] neurology as she has not been seen by them since 2020.  Refilled Keppra 500 mg twice daily for 90 days in the meantime.  Continue aspirin 81 mg daily for history of stroke. Type 2 diabetes mellitus without complication, with long-term current use of insulin (HCC) Check A1c and urine microalbumin/creatinine ratio.  Continue Jardiance and metformin.  Patient to sign records release for ophthalmology to send [place] records of diabetic eye exam. Hyperlipidemia, unspecified hyperlipidemia type Recheck lipid panel today.  Continue rosuvastatin 10 mg daily. Encounter for immunization Flu and pneumonia vaccine.   Return in about 3 months (around 10/03/2023) for Diabetes follow-up. Shelby Mattocks, DO 07/04/2023, 2:28 PM PGY-3, [facility]

## 2023-07-04 NOTE — Assessment & Plan Note (Addendum)
Recheck lipid panel today.  Continue rosuvastatin 10 mg daily.

## 2023-07-04 NOTE — Assessment & Plan Note (Addendum)
Check A1c and urine microalbumin/creatinine ratio.  Continue Jardiance and metformin.  Patient to sign records release for ophthalmology to send [place] records of diabetic eye exam.

## 2023-07-04 NOTE — Assessment & Plan Note (Addendum)
BP: 127/68 today. Well controlled. Goal of <130/80. Medication regimen: Lisinopril 5 mg daily. Check CMP.

## 2023-07-04 NOTE — Assessment & Plan Note (Addendum)
Referral sent to [facility] neurology as she has not been seen by them since 2020.  Refilled Keppra 500 mg twice daily for 90 days in the meantime.  Continue aspirin 81 mg daily for history of stroke.

## 2023-07-05 ENCOUNTER — Encounter: Payer: Self-pay | Admitting: Student

## 2023-07-05 LAB — COMPREHENSIVE METABOLIC PANEL
ALT: 20 IU/L (ref 0–32)
AST: 17 IU/L (ref 0–40)
Albumin: 4.8 g/dL (ref 3.9–4.9)
Alkaline Phosphatase: 86 IU/L (ref 44–121)
BUN/Creatinine Ratio: 20 (ref 12–28)
BUN: 13 mg/dL (ref 8–27)
Bilirubin Total: 0.3 mg/dL (ref 0.0–1.2)
CO2: 19 mmol/L — ABNORMAL LOW (ref 20–29)
Calcium: 9.6 mg/dL (ref 8.7–10.3)
Chloride: 99 mmol/L (ref 96–106)
Creatinine, Ser: 0.64 mg/dL (ref 0.57–1.00)
Globulin, Total: 2.4 g/dL (ref 1.5–4.5)
Glucose: 219 mg/dL — ABNORMAL HIGH (ref 70–99)
Potassium: 4 mmol/L (ref 3.5–5.2)
Sodium: 139 mmol/L (ref 134–144)
Total Protein: 7.2 g/dL (ref 6.0–8.5)
eGFR: 96 mL/min/{1.73_m2} (ref >59)

## 2023-07-05 LAB — MICROALBUMIN / CREATININE URINE RATIO
Creatinine, Urine: 26.1 mg/dL
Microalb/Creat Ratio: <11 mg/g creat (ref 0–29)
Microalbumin, Urine: <3 ug/mL

## 2023-07-05 LAB — LIPID PANEL
Chol/HDL Ratio: 3 ratio (ref 0.0–4.4)
Cholesterol, Total: 121 mg/dL (ref 100–199)
HDL: 40 mg/dL (ref >39)
LDL Chol Calc (NIH): 34 mg/dL (ref 0–99)
Triglycerides: 316 mg/dL — ABNORMAL HIGH (ref 0–149)
VLDL Cholesterol Cal: 47 mg/dL — ABNORMAL HIGH (ref 5–40)

## 2023-07-08 ENCOUNTER — Ambulatory Visit: Payer: Medicare Other | Admitting: Student

## 2023-07-10 ENCOUNTER — Encounter: Payer: Self-pay | Admitting: Neurology

## 2023-09-17 ENCOUNTER — Other Ambulatory Visit: Payer: Self-pay | Admitting: Student

## 2023-09-17 DIAGNOSIS — R569 Unspecified convulsions: Secondary | ICD-10-CM

## 2023-09-19 ENCOUNTER — Ambulatory Visit: Payer: Medicare Other | Admitting: Family Medicine

## 2023-09-19 ENCOUNTER — Encounter: Payer: Self-pay | Admitting: Family Medicine

## 2023-09-19 VITALS — BP 135/79 | HR 81 | Ht 61.0 in | Wt 123.4 lb

## 2023-09-19 DIAGNOSIS — E119 Type 2 diabetes mellitus without complications: Secondary | ICD-10-CM

## 2023-09-19 DIAGNOSIS — R569 Unspecified convulsions: Secondary | ICD-10-CM

## 2023-09-19 DIAGNOSIS — R519 Headache, unspecified: Secondary | ICD-10-CM

## 2023-09-19 DIAGNOSIS — I10 Essential (primary) hypertension: Secondary | ICD-10-CM | POA: Diagnosis not present

## 2023-09-19 DIAGNOSIS — Z794 Long term (current) use of insulin: Secondary | ICD-10-CM

## 2023-09-19 MED ORDER — LISINOPRIL 5 MG PO TABS
ORAL_TABLET | ORAL | 3 refills | Status: DC
Start: 2023-09-19 — End: 2023-10-07

## 2023-09-19 MED ORDER — LEVETIRACETAM 500 MG PO TABS
500.0000 mg | ORAL_TABLET | Freq: Two times a day (BID) | ORAL | 1 refills | Status: DC
Start: 2023-09-19 — End: 2023-12-12

## 2023-09-19 NOTE — Progress Notes (Signed)
    SUBJECTIVE:   CHIEF COMPLAINT / HPI:   Amy [name] is a 67yo F w/ hx of  T2DM, stroke, HTN, seizures that p/f headache. - Pt present with son, and spanish interpretor used via ipad.    Headache: - Happened 2 days ago, went out to eat later than usual. Then she got a headache. - They thought it was because they stayed out late that caused the headache.  - It was a "normal headache". Reports that it felt like "the headache you get when you're hungry". She gets headaches when she's hungry. - The headache affected entire head.  - Headache improved after she ate.  - Denies weakness, nausea, vision changes.  - Last took BP meds this morning and took yesterday too.  OBJECTIVE:   BP (!) 144/72   Pulse 84   Ht 5\' 1"  (1.549 m)   Wt 123 lb 6.4 oz (56 kg)   SpO2 98%   BMI 23.32 kg/m   General: Alert, pleasant woman.  Speaks with slurred speech, unable to understand, but patient body language and expressing appropriately.  NAD. Neuro: PERRLA.  Face symmetrical appearing.  1 out of 5 grip strength on right hand, 5 out of 5 grip strength in left hand (consistent with prior deficit). Normal gait HEENT: NCAT. MMM. CV: RRR, no murmurs.  Resp: CTAB, no wheezing or crackles. Normal WOB on RA.  Ext: Moves all ext spontaneously Skin: Warm, well perfused   ASSESSMENT/PLAN:    Assessment & Plan Acute nonintractable headache, unspecified headache type Consistent with prior headaches, improved with eating.  No red flag symptoms such as weakness, neurodeficits, intractability.  Discussed return precautions and stroke warning signs.    Advised to follow-up in 2 weeks for A1c check and diabetes counseling  Lincoln Brigham, MD [facility]

## 2023-09-19 NOTE — Patient Instructions (Addendum)
Qu bueno verte hoy. Gracias por venir.  Cosas que discutimos hoy:  1) Su dolor de cabeza parece normal y me alegro de que haya mejorado.  Tenga cuidado con los dolores de cabeza ms graves que podran ser un signo de derrame cerebral.  Busque atencin mdica de emergencia si: -Empiezas a tener debilidad en tu cuerpo con los dolores de [place]. -Cambios de visin -Desmayarse o sentir que est a punto de [place] -Estn vomitando incontrolablemente. -Tener un lado de la cara cado. -Est arrastrando las palabras o parece confundido ante los miembros de [place].  2) Para su diabetes, haga un seguimiento en 2 semanas para volver a controlar su A1c.     Good to see you today - Thank you for coming in  Things we discussed today:  1) Your headache sounds like a normal headache and I am glad it has improved.  Be careful of more serious headaches that could be a sign of strokes.  Seek emergency medical attention if you: -Start having weakness in your body with the headaches -Vision changes -Faint or feel like you are close to fainting -Are vomiting uncontrollably -Have drooping of one side of your face -Are slurring your speech or seemed confused to your family members  2) For your diabetes, please follow-up in 2 weeks to get your A1c rechecked

## 2023-10-07 ENCOUNTER — Ambulatory Visit: Payer: Self-pay | Admitting: Student

## 2023-10-07 ENCOUNTER — Encounter: Payer: Self-pay | Admitting: Student

## 2023-10-07 VITALS — BP 148/70 | HR 75 | Wt 123.8 lb

## 2023-10-07 DIAGNOSIS — E119 Type 2 diabetes mellitus without complications: Secondary | ICD-10-CM | POA: Diagnosis present

## 2023-10-07 DIAGNOSIS — Z1231 Encounter for screening mammogram for malignant neoplasm of breast: Secondary | ICD-10-CM | POA: Diagnosis not present

## 2023-10-07 DIAGNOSIS — I10 Essential (primary) hypertension: Secondary | ICD-10-CM

## 2023-10-07 DIAGNOSIS — G720 Drug-induced myopathy: Secondary | ICD-10-CM | POA: Diagnosis not present

## 2023-10-07 DIAGNOSIS — Z794 Long term (current) use of insulin: Secondary | ICD-10-CM | POA: Diagnosis not present

## 2023-10-07 LAB — POCT GLYCOSYLATED HEMOGLOBIN (HGB A1C): HbA1c, POC (controlled diabetic range): 7 % (ref 0.0–7.0)

## 2023-10-07 MED ORDER — TELMISARTAN 20 MG PO TABS: 20.0000 mg | ORAL_TABLET | Freq: Every day | ORAL | 3 refills | Status: DC

## 2023-10-07 MED ORDER — ROSUVASTATIN CALCIUM 5 MG PO TABS: 5.0000 mg | ORAL_TABLET | Freq: Every day | ORAL | 3 refills | Status: DC

## 2023-10-07 NOTE — Assessment & Plan Note (Addendum)
Elevated blood pressures x 2 in clinic today.  Also was elevated at her last clinic visit.  This despite good adherence to her 5 mg of lisinopril daily. -Stop lisinopril -Start telmisartan 20 mg daily -Okay for 49-month follow-up

## 2023-10-07 NOTE — Assessment & Plan Note (Signed)
Symptoms started issues she restarted her 10 mg of Crestor daily.  Her last lipid panel was 3 months ago with an LDL of 34, suspect we will be able to reduce her Crestor to just 5 mg daily. -Reduce Crestor to 5 mg daily

## 2023-10-07 NOTE — Progress Notes (Signed)
    SUBJECTIVE:   CHIEF COMPLAINT / HPI:   Patient is aphasic secondary to hemorrhagic stroke in 2015.  She interprets primarily through gesticulations, her son is present with her today to help interpret.  As her son speaks only [place], video Spanish interpreter was also used.  DM2 Here for follow-up of her diabetes.  She is supposed to be taking metformin 1000 mg twice daily and Jardiance 10 mg daily.  Unfortunately, it sounds like she has not been taking her Jardiance for the past couple of weeks due to some confusion.  She thought that this was the medicine she was supposed to take for dizziness (rather than her meclizine). Feels well.  She tells me that she does have an eye doctor.  Unfortunately, her son does not remember the name of this doctor so we cannot send a records request.  However next time they go they will ask to have records shared with [place].  HTN BP was elevated at her last visit with Dr. Sherrilee Gilles.  She is currently on just 5 mg of lisinopril.  Statin-related myopathy She indicates that she is having some lower extremity cramping since restarting her rosuvastatin in September.  She was previously on 20mg  of rosuvastatin but this was dose reduced due to myopathy. Unfortunately now seems to be having symptoms even on 10mg .   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 BLE are non-tender to palpation or calf-squeeze  ASSESSMENT/PLAN:   DM type 2 (diabetes mellitus, type 2) Adequate A1c control at 7.0%. -Reviewed her medications with her and clarified that [name] is for her diabetes and should be taken  every day -Continue Jardiance 10 mg daily -Continue metformin 1000 mg twice daily -Is on a statin -Follow-up in 3 months -Her son will try to get [place] records from her ophthalmologist for her diabetic retinal exam  Essential hypertension Elevated blood pressures x 2 in clinic today.  Also was elevated at her last clinic visit.  This despite good adherence to her 5 mg of lisinopril daily. -Stop lisinopril -Start telmisartan 20 mg daily -Okay for 43-month follow-up  Statin myopathy Symptoms started issues she restarted her 10 mg of Crestor daily.  Her last lipid panel was 3 months ago with an LDL of 34, suspect we will be able to reduce her Crestor to just 5 mg daily. -Reduce Crestor to 5 mg daily     J Dorothyann Gibbs, MD [facility]

## 2023-10-07 NOTE — Patient Instructions (Addendum)
[name],  Me gustara cambiar un poco tus medicamentos para la presin arterial.  DEJE de tomar lisinopril. COMENZAR Tomar telmisartn 20 mg al da  Voy a reducir la dosis de rosuvastatina a la mitad. Enviar una nueva receta para comprimidos de 5 mg.   Su Jardiance es su medicamento para la diabetes; asegrese de [employer]. La meclizina es el medicamento para los [place], no es necesario tomarla [employer].   He ordenado tu mamografa. Esto se [facility] [place], justo enfrente de [place]. Deber llamar para programar esta cita. Su nmero es [phone].  La direccin es [address], [place], [place] [address]  Debido a su diabetes, tambin debe hacerse un examen de la vista. Har que nuestros oftalmlogos llamen a su hijo para configurar esto.  Por favor vuelve a verme en 3 meses. [name], MD   Amy [name],  I'd like to change your blood pressure meds a bit.  STOP taking the lisinopril. START Taking telmisartan 20mg  daily  I am going to cut the dose of your rosuvastatin in half. I will send a new prescription for 5mg  tablets.   Your [name] is your diabetes medicine, be sure that you take this every day. The meclizine is the dizziness medicine, you do not need to take this every day.   I have ordered your mammogram. This will be done at the [facility] of [place], right across the street from our clinic. You will need to call to make this appointment. Their number is [phone].  The address is [address], [place], [place] [address]  Because of your diabetes, you should also have an eye exam. I will have our eye specialists give your son a call to set this up.  Please come back to see me in 3 months.   [name], MD

## 2023-10-07 NOTE — Assessment & Plan Note (Addendum)
Adequate A1c control at 7.0%. -Reviewed her medications with her and clarified that [name] is for her diabetes and should be taken every day -Continue Jardiance 10 mg daily -Continue metformin 1000 mg twice daily -Is on a statin -Follow-up in 3 months -Her son will try to get [place] records from her ophthalmologist for her diabetic retinal exam

## 2023-10-10 ENCOUNTER — Ambulatory Visit
Admission: RE | Admit: 2023-10-10 | Discharge: 2023-10-10 | Disposition: A | Discharge status: Home / Self Care | Payer: Medicare Other | Source: Ambulatory Visit | Attending: Family Medicine | Admitting: Family Medicine

## 2023-10-10 DIAGNOSIS — Z1231 Encounter for screening mammogram for malignant neoplasm of breast: Secondary | ICD-10-CM

## 2023-10-21 ENCOUNTER — Ambulatory Visit: Payer: Medicare Other

## 2023-11-11 ENCOUNTER — Ambulatory Visit: Payer: Medicare Other

## 2023-11-11 VITALS — Ht 61.0 in | Wt 123.0 lb

## 2023-11-11 DIAGNOSIS — Z0000 Encounter for general adult medical examination without abnormal findings: Secondary | ICD-10-CM

## 2023-11-11 NOTE — Progress Notes (Addendum)
Subjective:   Amy [name] is a 68 y.o. female who presents for an Initial Medicare Annual Wellness Visit.  Visit Complete: Virtual I connected with  Bebe Shaggy on 11/11/23 by a audio enabled telemedicine application and verified that I am speaking with the correct person using two identifiers.  Patient Location: Home  Provider Location: Office/Clinic  I discussed the limitations of evaluation and management by telemedicine. The patient expressed understanding and agreed to proceed.  Vital Signs: Because this visit was a virtual/telehealth visit, some criteria may be missing or patient reported. Any vitals not documented were not able to be obtained and vitals that have been documented are patient reported.  This patient declined Interactive audio and Acupuncturist. Therefore the visit was completed with audio only.  Cardiac Risk Factors include: advanced age (>69men, >69 women);diabetes mellitus;dyslipidemia;family history of premature cardiovascular disease;hypertension;sedentary lifestyle;Other (see comment), Risk factor comments: hx of CVA     Objective:    Today's Vitals   11/11/23 1454  Weight: 123 lb (55.8 kg)  Height: 5\' 1"  (1.549 m)  PainSc: 0-No pain   Body mass index is 23.24 kg/m.     09/19/2023    4:07 PM 07/04/2023    1:13 PM 07/07/2021    2:51 PM 07/05/2021   10:25 AM 06/21/2021    3:43 PM 05/25/2021    3:00 PM 09/19/2020   11:49 AM  Advanced Directives  Does Patient Have a Medical Advance Directive? No No No No No No No  Would patient like information on creating a medical advance directive? No - Patient declined  No - Patient declined   No - Patient declined No - Patient declined    Current Medications (verified) Outpatient Encounter Medications as of 11/11/2023  Medication Sig   aspirin EC 81 MG tablet Take 1 tablet (81 mg total) by mouth daily.   Blood Glucose Monitoring Suppl (ACCU-CHEK AVIVA PLUS) w/Device KIT Please use to  check blood sugars once a day.   glucose blood (ACCU-CHEK AVIVA) test strip 1 each by Other route daily. Use as instructed   JARDIANCE 10 MG TABS tablet TOME UNA TABLETA TODOS LOS DIAS   levETIRAcetam (KEPPRA) 500 MG tablet Take 1 tablet (500 mg total) by mouth 2 (two) times daily.   metFORMIN (GLUCOPHAGE) 1000 MG tablet TAKE 1 TABLET (1,000 MG TOTAL) BY MOUTH TWICE A DAY WITH FOOD   rosuvastatin (CRESTOR) 5 MG tablet Take 1 tablet (5 mg total) by mouth daily.   telmisartan (MICARDIS) 20 MG tablet Take 1 tablet (20 mg total) by mouth at bedtime.   No facility-administered encounter medications on file as of 11/11/2023.    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   Dysphagia    GERD (gastroesophageal reflux disease)    Glaucoma    Hypercholesteremia    Hypertension    Seizures (HCC)    LAST SEIZURE 2014 PER JOSELIN GRANDDAUGHTER    Stroke (HCC)    Thyroid disease    Past Surgical History:  Procedure Laterality Date   CESAREAN SECTION     GASTROSTOMY TUBE PLACEMENT     REMOVAL OF GASTROSTOMY  TUBE     Family History  Problem Relation Age of Onset   Heart Problems Father    Colon cancer Neg Hx    Colon polyps Neg Hx    Esophageal cancer Neg Hx    Rectal cancer Neg Hx    Stomach cancer Neg Hx    Social History   Socioeconomic History   Marital status: Married    Spouse name: Not on file   Number of children: 5   Years of education: Not on file   Highest education level: 3rd grade  Occupational History   Not on file  Tobacco Use   Smoking status: Never   Smokeless tobacco: Never  Vaping Use   Vaping status: Never Used  Substance and Sexual Activity   Alcohol use: No   Drug use: No   Sexual activity:  Not on file  Other Topics Concern   Not on file  Social History Narrative   Patient lives at home with her husband and daughter in law.         Social Drivers of Corporate investment banker Strain: Low Risk  (11/11/2023)   Overall Financial Resource Strain (CARDIA)    Difficulty of Paying Living Expenses: Not hard at all  Food Insecurity: Patient Unable To Answer (11/11/2023)   Hunger Vital Sign    Worried About Running Out of Food in the Last Year: Patient unable to answer    Ran Out of Food in the Last Year: Patient unable to answer  Transportation Needs: No Transportation Needs (11/11/2023)   PRAPARE - Administrator, Civil Service (Medical): No    Lack of Transportation (Non-Medical): No  Physical Activity: Inactive (11/11/2023)   Exercise Vital Sign    Days of Exercise per Week: 0 days    Minutes of Exercise per Session: 0 min  Stress: Patient Unable To Answer (11/11/2023)   [employer] of Occupational Health - Occupational Stress Questionnaire    Feeling of Stress : Patient unable to answer  Social Connections: Patient Unable To Answer (11/11/2023)   Social Connection and Isolation Panel [NHANES]    Frequency of Communication with Friends and Family: Patient unable to answer    Frequency of Social Gatherings with Friends and Family: Patient unable to answer    Attends Religious Services: Patient unable to answer    Active Member of Clubs or Organizations: Patient unable to answer    Attends Banker Meetings: Patient unable to answer    Marital Status: Patient unable to answer    Tobacco Counseling Counseling given: Not Answered   Clinical Intake:  Pre-visit preparation completed: Yes  Pain : No/denies pain Pain Score: 0-No pain     BMI - recorded: 23.24 Nutritional Status: BMI of 19-24  Normal Nutritional Risks: None Diabetes: Yes CBG done?: No Did pt. bring in CBG monitor from home?: No  How often do you need to have someone  help you when you read instructions, pamphlets, or other written materials from your doctor or pharmacy?: 3 - Sometimes What is the last grade level you completed in school?: N/A  Interpreter Needed?: Yes Interpreter Agency: [place] Interpreter Name: Rosalie Gums ID: [id number] Patient Declined Interpreter : No Patient signed [place] waiver: No (verbally)  Information entered by :: Griffyn Kucinski N. Johanna Matto, LPN.   Activities of Daily Living    11/11/2023    2:58 PM  In your present state of health, do you have any difficulty performing the following  activities:  Hearing? 0  Vision? 0  Difficulty concentrating or making decisions? 1  Walking or climbing stairs? 1  Dressing or bathing? 1  Doing errands, shopping? 1  Preparing Food and eating ? Y  Using the Toilet? Y  In the past six months, have you accidently leaked urine? N  Do you have problems with loss of bowel control? N  Managing your Medications? Y  Managing your Finances? Y  Housekeeping or managing your Housekeeping? Y    Patient Care Team: Alicia Amel, MD as PCP - General (Family Medicine)  Indicate any recent Medical Services you may have received from other than [facility] providers in the past year (date may be approximate).     Assessment:   This is a routine wellness examination for [facility].  Hearing/Vision screen Hearing Screening - Comments:: Denies hearing difficulties; no hearing aids.  Vision Screening - Comments:: Wears rx glasses - up to date with routine eye exams with [facility]    Goals Addressed             This Visit's Progress    Client and son understands the importance of follow-up with providers by attending scheduled visits.        Depression Screen    11/11/2023    2:59 PM 10/07/2023    3:50 PM 09/19/2023    4:05 PM 07/04/2023    1:13 PM 09/14/2021    1:40 PM 08/23/2021    2:02 PM 05/25/2021    3:00 PM  PHQ 2/9 Scores  PHQ - 2 Score  0 0 3 0 0 0  PHQ- 9 Score  0 0  6 0 3 3  Exception Documentation Other- indicate reason in comment box        Not completed Patient not able to talk due to CVA          Fall Risk    11/11/2023    2:51 PM 10/07/2023    3:50 PM 09/19/2023    4:05 PM 09/19/2020   11:48 AM 12/18/2019    2:43 PM  Fall Risk   Falls in the past year? 0 0 0 0 0  Number falls in past yr: 0 0 0 0 0  Injury with Fall? 0 0 0 0   Risk for fall due to : No Fall Risks  No Fall Risks    Follow up Falls prevention discussed  Falls evaluation completed Falls evaluation completed     MEDICARE RISK AT HOME: Medicare Risk at Home Any stairs in or around the home?: No If so, are there any without handrails?: No Home free of loose throw rugs in walkways, pet beds, electrical cords, etc?: Yes Adequate lighting in your home to reduce risk of falls?: Yes Life alert?: No Use of a cane, walker or w/c?: No Grab bars in the bathroom?: Yes Shower chair or bench in shower?: Yes Elevated toilet seat or a handicapped toilet?: No  TIMED UP AND GO:  Was the test performed? No    Cognitive Function:    11/11/2023    2:50 PM  MMSE - Mini Mental State Exam  Not completed: Unable to complete        Immunizations Immunization History  Administered Date(s) Administered   Fluad Quad(high Dose 65+) 09/14/2021   Fluad Trivalent(High Dose 65+) 07/04/2023   Influenza,inj,Quad PF,6+ Mos 07/15/2014, 07/12/2016, 06/10/2018, 08/05/2019   PFIZER Comirnaty(Gray Top)Covid-19 Tri-Sucrose Vaccine 05/25/2021   PFIZER(Purple Top)SARS-COV-2 Vaccination 12/31/2019, 01/21/2020   PNEUMOCOCCAL CONJUGATE-20  07/04/2023   Pneumococcal Conjugate-13 11/15/2014   Pneumococcal Polysaccharide-23 08/16/2016   Tdap 11/15/2014    TDAP status: Up to date  Flu Vaccine status: Up to date  Pneumococcal vaccine status: Up to date  Covid-19 vaccine status: Completed vaccines  Qualifies for Shingles Vaccine? Yes   Zostavax completed No   Shingrix Completed?: No.    Education has  been provided regarding the importance of this vaccine. Patient has been advised to call insurance company to determine out of pocket expense if they have not yet received this vaccine. Advised may also receive vaccine at local pharmacy or Health Dept. Verbalized acceptance and understanding.  Screening Tests Health Maintenance  Topic Date Due   Zoster Vaccines- Shingrix (1 of 2) Never done   OPHTHALMOLOGY EXAM  03/15/2020   DEXA SCAN  Never done   COVID-19 Vaccine (4 - 2024-25 season) 06/16/2023   HEMOGLOBIN A1C  04/06/2024   Diabetic kidney evaluation - eGFR measurement  07/03/2024   Diabetic kidney evaluation - Urine ACR  07/03/2024   Colonoscopy  09/17/2024   Medicare Annual Wellness (AWV)  11/10/2024   DTaP/Tdap/Td (2 - Td or Tdap) 11/15/2024   MAMMOGRAM  10/09/2025   Pneumonia Vaccine 45+ Years old  Completed   INFLUENZA VACCINE  Completed   Hepatitis C Screening  Completed   HPV VACCINES  Aged Out   FOOT EXAM  Discontinued    Health Maintenance  Health Maintenance Due  Topic Date Due   Zoster Vaccines- Shingrix (1 of 2) Never done   OPHTHALMOLOGY EXAM  03/15/2020   DEXA SCAN  Never done   COVID-19 Vaccine (4 - 2024-25 season) 06/16/2023    Colorectal cancer screening: Type of screening: Colonoscopy. Completed 09/18/2019. Repeat every 5 years  Mammogram status: Completed 10/10/2023. Repeat every year-due every 2 years  Bone density status: Never done  Lung Cancer Screening: (Low Dose CT Chest recommended if Age 32-80 years, 20 pack-year currently smoking OR have quit w/in 15years.) does not qualify.   Lung Cancer Screening Referral: no  Additional Screening:  Hepatitis C Screening: does qualify; Completed 06/24/2017  Vision Screening: Recommended annual ophthalmology exams for early detection of glaucoma and other disorders of the eye. Is the patient up to date with their annual eye exam?  No  Who is the provider or what is the name of the office in which the  patient attends annual eye exams? [facility] If pt is not established with a provider, would they like to be referred to a provider to establish care? No .   Dental Screening: Recommended annual dental exams for proper oral hygiene  Diabetic Foot Exam: Diabetic Foot Exam: Completed 09/25/2019; patient is overdue  Community Resource Referral / Chronic Care Management: CRR required this visit?  No   CCM required this visit?  No     Plan:     I have personally reviewed and noted the following in the patient's chart:   Medical and social history Use of alcohol, tobacco or illicit drugs  Current medications and supplements including opioid prescriptions. Patient is not currently taking opioid prescriptions. Functional ability and status Nutritional status Physical activity Advanced directives List of other physicians Hospitalizations, surgeries, and ER visits in previous 12 months Vitals Screenings to include cognitive, depression, and falls Referrals and appointments  In addition, I have reviewed and discussed with patient certain preventive protocols, quality metrics, and best practice recommendations. A written personalized care plan for preventive services as well as general preventive health  recommendations were provided to patient.     Mickeal Needy, LPN   01/21/8118   After Visit Summary: (Declined) Due to this being a telephonic visit, with patients personalized plan was offered to patient but patient Declined AVS at this time   Nurse Notes: Patient is due for an eye exam, Shingrix vaccine and bone density scan.

## 2023-12-09 NOTE — Progress Notes (Unsigned)
 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  As she has been well controlled on current regimen without a recurrent seizure in over 10 years, PCP may continue management of medication. Secondary stroke prevention as managed by PCP: ASA 81mg  daily LDL goal less than 70 Normotensive blood pressure Hgb A1c goal less than 7 Follow up as needed   Subjective:  Amy [name] is a 68 year old right-handed Spanish-speaking female from [place] with hypertension, diabetes, hyperlipidemia, depression, and stroke with seizure who follows up for symptomatic seizure secondary to left MCA stroke.  She is accompanied by her family who translates and supplements history.  MRIs personally reviewed.  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  She was in the hospital for elevated blood pressure following a fall where she it her head.  During her hospitalization, she developed expressive greater than receptive aphasia and right hemiparesis and numbness of arm and leg.  She required a craniotomy due to brain swelling.  During her hospitalization, she was intubated for several days and had a PEG placement.   While in the hospital, she had a seizure in October and later another seizure in December 2014.  She was initially on Dilantin which was later switched to Keppra due to rash.  She has had no recurrent seizures.   At baseline, she is aphasic, primarily expressive.  She has mild dysphagia.  She has right  sided residual weakness.  She also has residual right sided numbness.   She was hospitalized in January 2016 for headache and found to be hypertensive.  CT of head showed left MCA distribution encephalomalacia, but nothing acute.  Her glucose level was 400 and had a slight anion gap of 16 without ketoacidosis.  In April 2016, she began experiencing new left sided numbness and tingling without new motor deficits.  MRI and MRA of head performed 02/09/15 showed no new infarct.  Carotid doppler from 09/26/15 showed no hemodynamically significant stenosis.    I last saw her in November 2020.  She has not had a seizure since December 2014.  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 30 tablet 11   Blood Glucose Monitoring Suppl (ACCU-CHEK AVIVA PLUS) w/Device KIT Please use to check blood sugars once a day. 1 kit 0   glucose blood (ACCU-CHEK AVIVA) test strip 1 each by Other route daily.  Use as instructed 100 each 10   JARDIANCE 10 MG TABS tablet TOME UNA TABLETA TODOS LOS DIAS 90 tablet 3   levETIRAcetam (KEPPRA) 500 MG tablet Take 1 tablet (500 mg total) by mouth 2 (two) times daily. 180 tablet 1   metFORMIN (GLUCOPHAGE) 1000 MG tablet TAKE 1 TABLET (1,000 MG TOTAL) BY MOUTH TWICE A DAY WITH FOOD 180 tablet 3   rosuvastatin (CRESTOR) 5 MG tablet Take 1 tablet (5 mg total) by mouth daily. 90 tablet 3   telmisartan (MICARDIS) 20 MG tablet Take 1 tablet (20 mg total) by mouth at bedtime. 90 tablet 3   No current facility-administered medications on file prior to visit.    ALLERGIES: Allergies  Allergen Reactions   Shrimp [Shellfish Allergy] Anaphylaxis   Phenytoin Hives and Itching    FAMILY HISTORY: Family History  Problem Relation Age of Onset   Heart Problems Father    Colon cancer Neg Hx    Colon polyps Neg Hx    Esophageal cancer Neg Hx    Rectal cancer Neg Hx    Stomach cancer Neg Hx     Objective:  Blood pressure (!) 140/80, pulse 60, resp. rate 20, SpO2 99%. General: No acute distress.  Patient appears well-groomed.   Head:  Left temporal craniectomy defect Eyes:  fundi examined but not visualized Neck: supple, no paraspinal tenderness, full range of motion Heart: regular rate and rhythm Neurological Exam: Mental status: alert and oriented to person, place, and time, primarily expressive more than receptive aphasia Cranial nerves: CN I: not tested CN II: pupils equal, round and reactive to light, visual fields intact CN III, IV, VI:  full range of motion, no nystagmus, no ptosis CN V: reduced right V1-V3 facial sensation CN VII: right lower facial weakness  CN VIII: hearing intact CN IX, X: gag intact, uvula midline CN XI: sternocleidomastoid and trapezius muscles intact CN XII: tongue midline Bulk & Tone: mildly increased tone in right upper extremity Motor:  muscle strength 4+/5 right arm and 3+/5 right grip, 5-/5 right proximal leg, otherwise  5/5.  Sensation:  Pinprick and vibratory sensation reduced in right upper and lower extremities Deep Tendon Reflexes:  3+ right upper and lower extremities, 2+ left upper and lower extremities,  toes downgoing.   Finger to nose testing:  Without dysmetria.    Gait:  Slight right hemiparetic gait.  Romberg negative .    Thank you for allowing me to take part in the care of this patient.  Shon Millet, DO  CC:  Berle Mull, MD  Darnelle Spangle, MD

## 2023-12-10 ENCOUNTER — Ambulatory Visit: Payer: Medicare Other | Admitting: Neurology

## 2023-12-10 ENCOUNTER — Encounter: Payer: Self-pay | Admitting: Neurology

## 2023-12-10 VITALS — BP 140/80 | HR 60 | Resp 20

## 2023-12-10 DIAGNOSIS — G40109 Localization-related (focal) (partial) symptomatic epilepsy and epileptic syndromes with simple partial seizures, not intractable, without status epilepticus: Secondary | ICD-10-CM | POA: Diagnosis not present

## 2023-12-10 DIAGNOSIS — E785 Hyperlipidemia, unspecified: Secondary | ICD-10-CM | POA: Diagnosis not present

## 2023-12-10 DIAGNOSIS — Z8673 Personal history of transient ischemic attack (TIA), and cerebral infarction without residual deficits: Secondary | ICD-10-CM

## 2023-12-10 DIAGNOSIS — I69320 Aphasia following cerebral infarction: Secondary | ICD-10-CM | POA: Diagnosis not present

## 2023-12-10 DIAGNOSIS — E119 Type 2 diabetes mellitus without complications: Secondary | ICD-10-CM | POA: Diagnosis not present

## 2023-12-10 DIAGNOSIS — Z794 Long term (current) use of insulin: Secondary | ICD-10-CM

## 2023-12-10 DIAGNOSIS — I10 Essential (primary) hypertension: Secondary | ICD-10-CM

## 2023-12-10 NOTE — Patient Instructions (Addendum)
 Continue aspirin 81mg  daily Continue rosuvastatin Improve sugar control Blood pressure control Mediterranean diet Routine exercise Continue levetiracetam 500mg  twice daily, may continue to be managed by your primary care provider. Follow up as needed.  1. Contine con 81 mg de aspirina al da. 2. Continuar con rosuvastatina 3. Mejorar el control del azcar 4. Control de la presin arterial 5. Dieta mediterrnea 6. Ejercicio de rutina 7. Contine con levetiracetam 500 mg dos veces al da; su proveedor de atencin primaria puede seguir administrndolo. 8. Haga un seguimiento segn sea necesario.    Dieta mediterrnea Mediterranean Diet La dieta mediterrnea se basa en las tradiciones de los pases ubicados en las costas del mar Mediterrneo. Se centra en comer ms: Frutas y verduras. Cereales integrales, frijoles, frutos secos y semillas. Grasas cardiosaludables. Estas son Amy [name] que son buenas para el corazn. Implica comer menos: Productos lcteos. Carne y Amy [name]. Alimentos procesados con azcar, sal y grasa agregadas. Este tipo de dieta puede ayudar a Information systems manager. Tambin puede [name] si usted tiene una enfermedad a largo plazo (crnica), como una enfermedad renal o cardaca. Consejos para seguir [name], minerals Al leer las etiquetas de los alimentos Revise los alimentos envasados para [name] lo siguiente: El tamao de la porcin. En el caso del arroz y la pasta, el tamao de la porcin es la cantidad de alimento cocido, no seco. Las grasas totales. Evite los alimentos que contienen grasas saturadas o grasas trans. Azcares agregados, como el [place] de maz. Al ir de compras  Intente consumir una dieta equilibrada. Compre alimentos variados como, por ejemplo: Frutas y verduras frescas. Es posible que pueda obtenerlas en los mercados de agricultores locales. Tambin puede comprarlas congeladas. Cereales, frijoles, frutos secos y semillas. Algunos de  estos pueden comprarse a granel. Mariscos frescos. Aves y [name]. Productos lcteos con bajo contenido de [name]. Compre ingredientes enteros en lugar de alimentos que ya hayan sido envasados. Si no puede conseguir [name] de buena calidad, compre camarones congelados precocidos o pescado en lata, como atn, salmn o sardinas. Surta su despensa para tener siempre determinados productos a mano, por ejemplo, aceite de oliva, atn en lata, tomates en lata, arroz, pasta y frijoles. Al cocinar Cocine con aceite de oliva extra virgen en lugar de usar mantequilla u otros aceites vegetales. Coma la carne roja como guarnicin. Coma verduras o cereales como plato principal. Esto significa consumir porciones pequeas de carne o agregarla a la pasta o a los guisos en pequeas cantidades. Use frijoles o verduras en lugar de carne en platos comunes como chili o lasaa. Pruebe diferentes mtodos de coccin. Intente asar, cocer al vapor y saltear verduras. Agregue verduras congeladas a sopas, guisos, pasta o arroz. Agregue frutos secos o semillas para cubrir la porcin de grasas saludables y protena vegetal en cada comida. Puede agregarlos a yogures, ensaladas o platos con verduras. Marine el pescado o las verduras con aceite de [name], [name] de [name], ajo y Science writer. Planificacin de las comidas Planifique consumir una comida vegetariana un da a [name]. Si es posible, intente agregar [employer]. Coma mariscos dos o ms veces por semana. Tenga a mano colaciones sanas. Pueden incluir: Bastones de verduras con humus. Yogur griego. Mezcla de frutos secos y frutas. Consuma comidas equilibradas. Estas deberan incluir: Fruta: 2 o 3 porciones por da. Verdura: 4 o 5 porciones por [name]. Productos lcteos descremados: 2 porciones por [name]. Carne magra, de ave o de pescado: 1 porcin por [name]. Frijoles y legumbres: 2  o ms porciones por semana. Frutos secos y semillas: 1 o 2  porciones por [name]. Cereales integrales: 6 a 8 porciones por da. Aceite de oliva extra virgen: 3 o 4 porciones por da. Limite las carnes rojas y los dulces a solo unas porciones al mes. Estilo de vida  Trate de cocinar y comer con su familia. Beba suficiente lquido para [name] pis (orina) de color amarillo plido. Mantngase fsicamente [name]. Esto incluye: Ejercicio aerbico, que es el ejercicio que hace que el corazn lata ms rpido. Por ejemplo, correr y Programmer, systems. Actividades recreativas, como jardinera, caminatas o tareas domsticas. Intente dormir de 7 a 8 horas todas las noches. Beba vino tinto si el mdico lo autoriza. Una copa de vino tiene 5 oz (150 ml). Es posible que le permitan tomar: Hasta 1 copa por da si es mujer y no est embarazada. Hasta 2 copas por da si es hombre. Qu alimentos debo comer? Frutas Manzanas. Damascos. Aguacate. Bayas. Bananas. Cerezas. Dtiles. Higos. Uvas. Limones. Meln. Naranjas. Duraznos. Ciruelas. Granada. Verduras Alcachofas. Remolachas. Brcoli. Repollo. Zanahorias. [name]. Judas verdes. Acelga. Col rizada. Espinaca. Cebollas. Puerro. Guisantes. Calabaza. Tomates. Pimientos. Rbanos. Cereales Pastas integrales. Arroz integral. [name] burgol. Polenta. Cuscs. Pan integral. Avena. Quinua. Carnes y otras protenas [name]. Almendras. Semillas de girasol. Piones. Manes. Bacalao. Salmn. Vieiras. Camarones. Atn. Tilapia. Almejas. Ostras. Huevos. Pollo o pavo sin piel. Lcteos Leche con bajo contenido de [place]. Queso. Yogur griego. Grasas y aceites Aceite de oliva extra virgen. Aceite de aguacate. Aceite de semillas de uva. [name] Vino tinto. T de hierbas. Dulces y postres Yogur griego con miel. Manzanas asadas. Peras asadas. Mezcla de frutos secos. Alios y condimentos Alb[place]aca. Cilantro. Coriandro. Comino. Menta. Perejil. Salvia. [name]. Estragn. Ajo. Organo. Tomillo. Pimienta. Aceto balsmico. Tahini.  Hummus. Salsa de tomate. Aceitunas. Hongos. Es posible que los productos que se enumeran ms arriba no sean todos los alimentos y las bebidas que puede consumir. Consulte a un nutricionista para obtener ms informacin. Qu alimentos debo limitar? Esta es una lista de los alimentos que se deben comer con muy poca frecuencia. Nils Pyle Frutas enlatadas con almbar. Verduras Papas fritas. Cereales Comidas con arroz o pasta envasados. Cereales con azcar agregado. Refrigerios con azcar agregada. Carnes y [address] protenas Carne de vaca. Cerdo. Cordero. Pollo o pavo con piel. Perros calientes. Tocino. Lcteos Helados. Tami Ribas. Leche entera. Grasas y [employer]. Aceite de canola. Aceite vegetal. Grasa de carne de res. Manteca de cerdo. Bebidas Jugos. Refrescos endulzados con azcar. Cerveza. Licores y bebidas espirituosas. Dulces y Occupational hygienist. Bizcochuelos. Pasteles. Caramelos. Alios y condimentos Mayonesa. Salsas y adobos listos para consumir. Es posible que los productos que se enumeran ms arriba no sean todos los alimentos y las bebidas que debe limitar. Consulte a un nutricionista para obtener ms informacin. Dnde obtener ms informacin American Heart Association (Asociacin Estadounidense del [place], [place]): heart.org Esta informacin no tiene Theme park manager el consejo del mdico. Asegrese de hacerle al mdico cualquier pregunta que tenga. Document Revised: 02/14/2023 Document Reviewed: 02/14/2023 Elsevier Patient Education  2024 [employer].

## 2023-12-12 ENCOUNTER — Ambulatory Visit (INDEPENDENT_AMBULATORY_CARE_PROVIDER_SITE_OTHER): Payer: Medicare Other | Admitting: Student

## 2023-12-12 ENCOUNTER — Encounter: Payer: Self-pay | Admitting: Student

## 2023-12-12 VITALS — BP 126/71 | HR 87 | Ht 61.0 in | Wt 118.8 lb

## 2023-12-12 DIAGNOSIS — Z13820 Encounter for screening for osteoporosis: Secondary | ICD-10-CM

## 2023-12-12 DIAGNOSIS — E119 Type 2 diabetes mellitus without complications: Secondary | ICD-10-CM | POA: Diagnosis not present

## 2023-12-12 DIAGNOSIS — Z794 Long term (current) use of insulin: Secondary | ICD-10-CM

## 2023-12-12 DIAGNOSIS — I10 Essential (primary) hypertension: Secondary | ICD-10-CM

## 2023-12-12 DIAGNOSIS — Z780 Asymptomatic menopausal state: Secondary | ICD-10-CM

## 2023-12-12 DIAGNOSIS — R569 Unspecified convulsions: Secondary | ICD-10-CM

## 2023-12-12 DIAGNOSIS — I69320 Aphasia following cerebral infarction: Secondary | ICD-10-CM

## 2023-12-12 MED ORDER — EMPAGLIFLOZIN 10 MG PO TABS: 10.0000 mg | ORAL_TABLET | Freq: Every day | ORAL | 3 refills | Status: DC

## 2023-12-12 MED ORDER — ROSUVASTATIN CALCIUM 5 MG PO TABS: 5.0000 mg | ORAL_TABLET | Freq: Every day | ORAL | 3 refills | Status: DC

## 2023-12-12 MED ORDER — ASPIRIN 81 MG PO TBEC: 81.0000 mg | DELAYED_RELEASE_TABLET | Freq: Every day | ORAL | 3 refills | Status: AC

## 2023-12-12 MED ORDER — LEVETIRACETAM 500 MG PO TABS: 500.0000 mg | ORAL_TABLET | Freq: Two times a day (BID) | ORAL | 1 refills | Status: DC

## 2023-12-12 MED ORDER — METFORMIN HCL 1000 MG PO TABS
1000.0000 mg | ORAL_TABLET | Freq: Two times a day (BID) | ORAL | 3 refills | Status: DC
Start: 2023-12-12 — End: 2023-12-22

## 2023-12-12 MED ORDER — TELMISARTAN 20 MG PO TABS: 20.0000 mg | ORAL_TABLET | Freq: Every day | ORAL | 3 refills | Status: DC

## 2023-12-12 NOTE — Patient Instructions (Addendum)
 I have ordered your bone density scan. This will be done at the [facility], right across the street from our clinic. You will need to call to make this appointment. Their number is [phone].  The address is [address], [place], [place] [address]  Your blood pressure is beautiful today.  I am sending in 90 day supplies of all of your medications.  [name], MD

## 2023-12-12 NOTE — Progress Notes (Signed)
    SUBJECTIVE:   CHIEF COMPLAINT / HPI:   Hypertension Follow-up At our last visit we transitioned her from lisinopril to telmisartan. She has been doing well with this. No concerns. Does not check BP at home.   Seizures as late effect of stroke  Recently seen by neuro who felt that given she has been stable on her AED regimen for quite some time, management of this could be taken over by PCP. Daughter wants to be sure we are okay with this.   Health Maintenance Due for DEXA scan.    PERTINENT  PMH / PSH: Stroke with hemorrhagic conversion requiring craniectomy. 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 Seizures. HTN. HLD. DM2.   OBJECTIVE:   BP 126/71   Pulse 87   Ht 5\' 1"  (1.549 m)   Wt 118 lb 12.8 oz (53.9 kg)   SpO2 100%   BMI 22.45 kg/m   Gen: Well appearing and NAD, expressive>receptive aphasia. Participates as able.   Cardio: RRR without murmur Pulm: Normal WOB on RA, lungs clear Abd: Soft, non-tender, non-distended  Ext: Without edema or deformity   ASSESSMENT/PLAN:   Assessment & Plan Essential hypertension BP at goal today. Seems to be tolerating telmisartan without issue.  - BMP today - Continue telmisartan 20mg  daily Seizures (HCC) Happy to take over prescribing her Keppra. Has been stable on current dose for >10 years without seizure. - Keppra 500mg  BID  Healthcare maintenance - DEXA ordered - Refilled all meds as requested - F/u in 3 months for DM follow-up   J Dorothyann Gibbs, MD [facility]

## 2023-12-13 ENCOUNTER — Encounter: Payer: Self-pay | Admitting: Student

## 2023-12-13 LAB — BASIC METABOLIC PANEL
BUN/Creatinine Ratio: 17 (ref 12–28)
BUN: 12 mg/dL (ref 8–27)
CO2: 21 mmol/L (ref 20–29)
Calcium: 9.9 mg/dL (ref 8.7–10.3)
Chloride: 99 mmol/L (ref 96–106)
Creatinine, Ser: 0.69 mg/dL (ref 0.57–1.00)
Glucose: 151 mg/dL — ABNORMAL HIGH (ref 70–99)
Potassium: 4.5 mmol/L (ref 3.5–5.2)
Sodium: 140 mmol/L (ref 134–144)
eGFR: 94 mL/min/{1.73_m2} (ref >59)

## 2023-12-13 NOTE — Assessment & Plan Note (Signed)
 BP at goal today. Seems to be tolerating telmisartan without issue.  - BMP today - Continue telmisartan 20mg  daily

## 2023-12-18 ENCOUNTER — Other Ambulatory Visit: Payer: Self-pay | Admitting: Student

## 2023-12-18 DIAGNOSIS — E119 Type 2 diabetes mellitus without complications: Secondary | ICD-10-CM

## 2023-12-20 ENCOUNTER — Telehealth: Payer: Self-pay | Admitting: Student

## 2023-12-20 DIAGNOSIS — E119 Type 2 diabetes mellitus without complications: Secondary | ICD-10-CM

## 2023-12-20 NOTE — Telephone Encounter (Signed)
 Patient walked in stated she is concerned that the doctor prescribed 1000 mg when she is used to taking 500 mg.  Does not want to take 1000 mg, prefer to take the 500 mg.  Ph # [phone]

## 2023-12-22 MED ORDER — METFORMIN HCL 500 MG PO TABS: 500.0000 mg | ORAL_TABLET | Freq: Two times a day (BID) | ORAL | 3 refills | Status: DC

## 2023-12-22 NOTE — Telephone Encounter (Signed)
 Rx has been updated per patient request. Her A1c control is adequate on the 500mg  dose of metformin. [name], MD

## 2023-12-26 ENCOUNTER — Other Ambulatory Visit: Payer: Self-pay | Admitting: Student

## 2023-12-26 DIAGNOSIS — E119 Type 2 diabetes mellitus without complications: Secondary | ICD-10-CM

## 2023-12-26 DIAGNOSIS — R569 Unspecified convulsions: Secondary | ICD-10-CM

## 2024-03-24 ENCOUNTER — Encounter: Payer: Self-pay | Admitting: *Deleted

## 2024-03-27 ENCOUNTER — Other Ambulatory Visit: Payer: Self-pay | Admitting: Student

## 2024-03-27 DIAGNOSIS — Z794 Long term (current) use of insulin: Secondary | ICD-10-CM

## 2024-04-01 ENCOUNTER — Encounter: Payer: Self-pay | Admitting: Student

## 2024-04-01 ENCOUNTER — Ambulatory Visit: Admitting: Student

## 2024-04-01 VITALS — BP 125/76 | HR 74 | Ht 61.0 in | Wt 120.6 lb

## 2024-04-01 DIAGNOSIS — T466X5A Adverse effect of antihyperlipidemic and antiarteriosclerotic drugs, initial encounter: Secondary | ICD-10-CM | POA: Diagnosis not present

## 2024-04-01 DIAGNOSIS — G720 Drug-induced myopathy: Secondary | ICD-10-CM | POA: Diagnosis present

## 2024-04-01 NOTE — Patient Instructions (Addendum)
 I think the pain you are having is from your cholesterol medication. There seems to have been a mixup between us  and the pharmacy. I am discarding the medication that is at an incorrect dose. If you continue having pain while taking the rosuvastatin  5mg  daily, please space out your doses to every other day.  Creo que el dolor que tiene se debe a su medicamento para Print production planner. Parece que hubo una confusin entre nosotros y Garment/textile technologist. Voy a Therapist, occupational que tiene una dosis incorrecta. Si contina teniendo dolor mientras toma rosuvastatina 5 mg al da, por favor, espacie las dosis a [employer].  Alexa Andrews, MD

## 2024-04-01 NOTE — Assessment & Plan Note (Signed)
 Patient with known sensitivity to statins and prior myopathy.  It seems that she has inadvertently been taking 3 times her dose of rosuvastatin .  It is a bit unclear to me how she came to have these 2 separate prescriptions under 2 different names.  I will call her pharmacy and try to discontinue any further fills on the 10 mg prescription. -Stop the 10 mg tablets, I have asked our pharmacy team to discard these -Continue rosuvastatin  5 mg daily -If symptoms persist on the 5 mg daily dose, she can cut back to 5 mg every other day

## 2024-04-01 NOTE — Progress Notes (Signed)
    SUBJECTIVE:   CHIEF COMPLAINT / HPI:   Myalgias Diffuse body aches for the past 2 weeks.  She does have a history of statin induced myalgias and we have dose reduced her down to just 5 mg of rosuvastatin  daily.  She has been on this for quite some time now and has been doing well.  She brings in all of her medications today and on review of her medication bottles it seems that she has 2 bottles of rosuvastatin  written under 2 slightly different names that with the same birthdate and address.  I suspect that she has been taking 50 mg of rosuvastatin  daily, 3 times when she has tolerated in the past.  The bottle of 10 mg rosuvastatin  was just filled on June 1 fitting the timeline of the development of her symptoms. She denies any other symptoms at this time including chest pain, shortness of breath, URI symptoms, fevers, weight loss.     OBJECTIVE:   BP 125/76   Pulse 74   Ht 5' 1 (1.549 m)   Wt 120 lb 9.6 oz (54.7 kg)   SpO2 99%   BMI 22.79 kg/m   General: alert & oriented, no apparent distress, well groomed.  Aphasic at baseline.  Does seem to understand and follow conversation. 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  It seems that she has inadvertently been taking 3 times her dose of rosuvastatin .  It is a bit unclear to me how she came to have these 2 separate prescriptions under 2 different names.  I will call her pharmacy and try to discontinue any further fills on the 10 mg prescription. -Stop the 10 mg tablets, I have asked our pharmacy team to discard these -Continue rosuvastatin  5 mg daily -If symptoms persist on  the 5 mg daily dose, she can cut back to 5 mg every other day     J Lark Plum, MD [facility]

## 2024-04-13 NOTE — Progress Notes (Signed)
    SUBJECTIVE:   CHIEF COMPLAINT / HPI:   *Utilized Spanish interpreter for entirety of visit.  Presents with husband and son who provide additional history.  Statin myopathy Seen 04/01/2024 for similar concern, discarded additional statin prescriptions and continued on rosuvastatin  5 mg daily.  Patient denies any concerns with rosuvastatin  use.  Reports she did stop taking her lisinopril  2 weeks ago and feels she may be more dizzy since that time.  Reports she is taking the telmisartan .  Checking BP occasionally at home, husband reports it was in the 120s this morning but he does not normally keep track of the numbers.  Diabetes Current Regimen: Jardiance  10mg  daily, Metformin  500mg  BID CBGs: 127 fasting blood sugar.   Last A1c:  Lab Results  Component Value Date   HGBA1C 6.8 04/15/2024    Last Eye Exam: UTD Statin: Rosuvastatin  5 mg daily ACE/ARB: Telmisartan  20 mg daily  PERTINENT  PMH / PSH: HTN, T2DM, CVA, HLD  OBJECTIVE:   BP 133/73   Pulse 87   Ht 5' 1 (1.549 m)   Wt 119 lb 9.6 oz (54.3 kg)   SpO2 97%   BMI 22.60 kg/m    General: NAD, pleasant, able to participate in exam Cardiac: RRR, no murmurs. Respiratory: CTAB, normal effort, No wheezes, rales or rhonchi Extremities: no edema or cyanosis. Skin: warm and dry, no rashes noted Neuro: alert, no obvious focal deficits Psych: Normal affect and mood  ASSESSMENT/PLAN:   Assessment & Plan Type 2 diabetes mellitus without complication, with long-term current use of insulin  (HCC) A1c 6.8, improved from 6 months ago.  Taking metformin  1000 mg twice daily, increased dosing in the chart.  Continue current regimen repeat A1c in 3 months. Statin myopathy Tolerated rosuvastatin  5 mg daily, continue current therapy. Essential hypertension 133/73 upon repeat, initially elevated and patient presenting with bottle of lisinopril  to discuss further.  Advised patient she should be taking telmisartan , recommend follow-up in 2  weeks with all meds to evaluate further. -2-week BP log -Return with all medications for complete med rec   Dr. [name], DO [facility]

## 2024-04-15 ENCOUNTER — Encounter: Payer: Self-pay | Admitting: Family Medicine

## 2024-04-15 ENCOUNTER — Ambulatory Visit: Admitting: Family Medicine

## 2024-04-15 VITALS — BP 133/73 | HR 87 | Ht 61.0 in | Wt 119.6 lb

## 2024-04-15 DIAGNOSIS — I10 Essential (primary) hypertension: Secondary | ICD-10-CM

## 2024-04-15 DIAGNOSIS — E119 Type 2 diabetes mellitus without complications: Secondary | ICD-10-CM

## 2024-04-15 DIAGNOSIS — Z794 Long term (current) use of insulin: Secondary | ICD-10-CM

## 2024-04-15 DIAGNOSIS — G720 Drug-induced myopathy: Secondary | ICD-10-CM

## 2024-04-15 LAB — POCT GLYCOSYLATED HEMOGLOBIN (HGB A1C): HbA1c, POC (controlled diabetic range): 6.8 % (ref 0.0–7.0)

## 2024-04-15 MED ORDER — METFORMIN HCL 1000 MG PO TABS: 1000.0000 mg | ORAL_TABLET | Freq: Two times a day (BID) | ORAL | 1 refills | Status: DC

## 2024-04-15 NOTE — Patient Instructions (Addendum)
 Fue un placer verte hoy! [name] por elegir [facility].  Por favor, trae TODOS tus medicamentos a cada visita.  Hoy hablamos sobre:  1. Por favor, controla tu presin arterial todos los [employer] prximas 2 semanas y regresa con un registro de todas las lecturas. Me gustara que contines con tu medicacin para la presin arterial Community education officer.  2. Tu A1c es de 6.8, se ve muy bien! Por favor, contina con tu rgimen actual para la diabetes y repetiremos tu A1c en 3 meses. Por favor, toma Metofemrin 1000 mg dos veces al da y Jardiance . Por favor, controla tu nivel de azcar en sangre en ayunas varias veces por semana; el objetivo es mantenerlo entre 100 y 120. Si est por debajo de 70, por favor, vuelve a verme, ya que tu diabetes podra estar sobretratada.  Por favor, haz una cita de seguimiento en 2 semanas.  Si an no lo has hecho, regstrate en My Chart para acceder fcilmente a tus resultados de laboratorio y comunicarte con tu mdico de cabecera.  Llame a la clnica al [phone] si sus sntomas empeoran o tiene alguna inquietud.  Asegrese de programar una cita de seguimiento en la recepcin antes de irse hoy.  [name], DO [facility]  It was wonderful to see you today! Thank you for choosing [facility].   Please bring ALL of your medications with you to every visit.   Today we talked about:  Please check your blood pressure every day for the next 2 weeks and return with a log of all the readings. I would like you to continue your blood pressure medication during this time.  Your A1c is 6.8, it looks great! Please continue your current diabetes regimen and we will repeat your A1c in 3 months. Please take Metofmrin 1000mg  BID and the Jardiance . Please check your fasting blood sugar a few times per week and the goal is to have it between 100-120. If it is below 70, please come back to see me as your diabetes may be  over treated.  Please follow up in 2 weeks  If you haven't already, sign up for My Chart to have easy access to your labs results, and communication with your primary care physician.  Call the clinic at [phone] if your symptoms worsen or you have any concerns.  Please be sure to schedule follow up at the front desk before you leave today.   [name], DO Family Medicine    Hoja de Registro de Presin Arterial Para tomarse la presin arterial, necesitar un tensimetro. Puede comprar un tensimetro (monitor de presin arterial) en su clnica, farmacia o en lnea. Al elegir rojean, considere:  Un tensimetro automtico con brazalete.  Un brazalete que se ajuste perfectamente a la parte superior del brazo. Solo debe caber un dedo entre el brazo y el brazalete.  Un dispositivo que [employer] Corporation de la presin arterial.  No elija un tensimetro que mida la presin arterial desde la mueca o el dedo. Siga las instrucciones de su profesional de la salud sobre cmo tomarse la presin arterial. Para usar este formulario:  Tome sus medicamentos para la presin arterial [employer].  Estas mediciones deben tomarse despus de haber estado en reposo durante al menos 10 a 15 minutos.  Tome al menos 2 lecturas con cada control de presin arterial. Esto garantiza que los resultados sean correctos. Espere de 1 a 2 minutos entre Financial controller.  Anote  los [name] espacios de este formulario. Tenga en cuenta que siempre debe registrarse la presin sistlica sobre la diastlica. Ambas cifras son importantes.  Repita esto todos los [address] 2 a 3 semanas, o segn lo indique su profesional de [employer].  Blood Pressure Record Sheet To take your blood pressure, you will need a blood pressure machine. You can buy a blood pressure machine (blood pressure monitor) at your clinic, drug store, or online. When choosing one, consider: An automatic monitor that has an arm cuff. A cuff  that wraps snugly around your upper arm. You should be able to fit only one finger between your arm and the cuff. A device that stores blood pressure reading results. Do not choose a monitor that measures your blood pressure from your wrist or finger. Follow your health care provider's instructions for how to take your blood pressure. To use this form: Take your blood pressure medications every day These measurements should be taken when you have been at rest for at least 10-15 min Take at least 2 readings with each blood pressure check. This makes sure the results are correct. Wait 1-2 minutes between measurements. Write down the results in the spaces on this form. Keep in mind it should always be recorded systolic over diastolic. Both numbers are important.  Repeat this every day for 2-3 weeks, or as told by your health care provider.  Make a follow-up appointment with your health care provider to discuss the results.  Blood Pressure Log Date Medications taken? (Y/N) Blood Pressure Time of Day

## 2024-04-15 NOTE — Assessment & Plan Note (Signed)
 133/73 upon repeat, initially elevated and patient presenting with bottle of lisinopril  to discuss further.  Advised patient she should be taking telmisartan , recommend follow-up in 2 weeks with all meds to evaluate further. -2-week BP log -Return with all medications for complete med rec

## 2024-04-15 NOTE — Assessment & Plan Note (Signed)
 A1c 6.8, improved from 6 months ago.  Taking metformin  1000 mg twice daily, increased dosing in the chart.  Continue current regimen repeat A1c in 3 months.

## 2024-04-15 NOTE — Assessment & Plan Note (Signed)
 Tolerated rosuvastatin  5 mg daily, continue current therapy.

## 2024-04-22 ENCOUNTER — Ambulatory Visit (INDEPENDENT_AMBULATORY_CARE_PROVIDER_SITE_OTHER)
Admission: RE | Admit: 2024-04-22 | Discharge: 2024-04-22 | Disposition: A | Discharge status: Home / Self Care | Source: Ambulatory Visit | Attending: Family Medicine | Admitting: Family Medicine

## 2024-04-22 DIAGNOSIS — Z780 Asymptomatic menopausal state: Secondary | ICD-10-CM | POA: Diagnosis not present

## 2024-04-22 DIAGNOSIS — Z13820 Encounter for screening for osteoporosis: Secondary | ICD-10-CM | POA: Diagnosis not present

## 2024-04-23 ENCOUNTER — Ambulatory Visit: Payer: Self-pay | Admitting: Family Medicine

## 2024-04-23 ENCOUNTER — Telehealth: Payer: Self-pay

## 2024-04-23 NOTE — Telephone Encounter (Signed)
 Called patient twice via interpretor line, she did not answer but I was able to lvm. Cassell Mary CMA

## 2024-04-23 NOTE — Telephone Encounter (Signed)
 Pt son LVM on referral line asking for us  to resend new medication to pharmacy. He has talked to the pharmacy and they have not received the Rx.  Thank you Margit Dimes, CMA

## 2024-04-27 NOTE — Progress Notes (Unsigned)
    SUBJECTIVE:   CHIEF COMPLAINT / HPI:   Hypertension: - Medications: Telmisartan  20 mg daily - Compliance: Yes - Checking BP at home: Yes, see media tab for log SBP average in 120s but can range from 110s to 140. - Denies any SOB, CP, vision changes, LE edema, medication SEs, or symptoms of hypotension  Med rec - completed.   PERTINENT  PMH / PSH: HTN, T2DM, CVA, HLD   OBJECTIVE:   BP 128/72   Pulse 77   Ht 5' 1 (1.549 m)   Wt 119 lb 9.6 oz (54.3 kg)   SpO2 96%   BMI 22.60 kg/m    General: Alert, no apparent distress, well groomed HEENT: Normocephalic, atraumatic, moist mucus membranes, neck supple Respiratory: Normal respiratory effort GI: Non-distended Skin: No rashes, no jaundice Psych: Appropriate mood and affect  ASSESSMENT/PLAN:   Assessment & Plan Essential hypertension 128/72 in clinic, home log well-controlled on current regimen of telmisartan  20 mg daily.  Continue current regimen follow-up 3 months Osteopenia of lumbar spine DEXA scan consistent with osteopenia of lumbar spine, reassuringly normal femoral head bilaterally.  Recommend initiating daily vitamin D and calcium  supplementation addition to adding weightbearing activities to exercise regimen. Encounter for immunization Received COVID-vaccine   Dr. [name], DO [facility]

## 2024-04-28 ENCOUNTER — Encounter: Payer: Self-pay | Admitting: Family Medicine

## 2024-04-28 ENCOUNTER — Ambulatory Visit (INDEPENDENT_AMBULATORY_CARE_PROVIDER_SITE_OTHER): Admitting: Family Medicine

## 2024-04-28 VITALS — BP 128/72 | HR 77 | Ht 61.0 in | Wt 119.6 lb

## 2024-04-28 DIAGNOSIS — Z23 Encounter for immunization: Secondary | ICD-10-CM | POA: Diagnosis not present

## 2024-04-28 DIAGNOSIS — I10 Essential (primary) hypertension: Secondary | ICD-10-CM | POA: Diagnosis not present

## 2024-04-28 DIAGNOSIS — M8588 Other specified disorders of bone density and structure, other site: Secondary | ICD-10-CM

## 2024-04-28 NOTE — Patient Instructions (Addendum)
  Fue un placer verte hoy! [name] por elegir [facility].  Por favor, trae TODOS tus medicamentos a cada visita.  Hoy hablamos sobre:  1. Tu presin arterial se ve excelente! Sigue tomando telmisartn 20 mg al da. Te recomiendo que contines tomndote la presin arterial varias veces por semana para asegurarte de que est por debajo de 140/90.  2. Los medicamentos que trajiste son perfectos y coinciden con tu lista de medicamentos. Por favor, tambin toma tu aspirina 81 mg al da (40 mg).  3. Por favor, toma vitamina D y calcio a diario para [employer] salud de tus [place]. Repetiremos la densitometra sea en 2 aos para asegurarnos de que no haya ms prdida sea. Tambin puedes hacer ejercicios con pesas, como sentadillas, con algo de peso ligero, como alimentos enlatados.  Por favor, acude a tu cita de seguimiento en 3 meses para tu atencin de [place].  Si an no lo ha hecho, regstrese en My Chart para acceder fcilmente a sus resultados de laboratorio y comunicarse con su mdico de cabecera.  Llame a la clnica al [phone] si sus sntomas empeoran o tiene alguna inquietud.  Asegrese de programar una cita de seguimiento en la recepcin antes de irse hoy.  [name], DO [facility]  It was wonderful to see you today! Thank you for choosing [facility].   Please bring ALL of your medications with you to every visit.   Today we talked about:  Your blood pressure looks great!  Please keep up taking the telmisartan  20 mg daily.  I do recommend you continue to take your blood pressure a few times per week to ensure it is less than 140/90.  The medications you brought are perfect and match with your med list that I have.  Please also take your aspirin  81 mg daily40 mg. Please take vitamin D and calcium  daily to maintain your bone health. We will repeat the DEXA scan in 2 years to ensure there is no further bone loss. You can also do  some weighted exercise such as squats with some small amount of weights such as canned goods.   Please follow up in 3 months for routine care.  If you haven't already, sign up for My Chart to have easy access to your labs results, and communication with your primary care physician.  Call the clinic at [phone] if your symptoms worsen or you have any concerns.  Please be sure to schedule follow up at the front desk before you leave today.   [name], DO Family Medicine

## 2024-04-28 NOTE — Assessment & Plan Note (Signed)
 128/72 in clinic, home log well-controlled on current regimen of telmisartan  20 mg daily.  Continue current regimen follow-up 3 months

## 2024-06-16 ENCOUNTER — Telehealth: Payer: Self-pay

## 2024-06-16 NOTE — Telephone Encounter (Signed)
 Pt son calling LVM to ask for a refill of stroke medicine Called number left on VM using [address], [place], [address]. LVM asking for a return call to clarify what the name of the medication is that needs refilling.   Margit Dimes, CMA

## 2024-06-17 ENCOUNTER — Other Ambulatory Visit: Payer: Self-pay

## 2024-06-17 DIAGNOSIS — R569 Unspecified convulsions: Secondary | ICD-10-CM

## 2024-06-17 NOTE — Telephone Encounter (Signed)
 Pt son returned call. Medication that needs refilling is levetiracetam  500 mg tabs. Refill request has been sent to Dr Theophilus.  Margit Dimes, CMA

## 2024-06-18 MED ORDER — LEVETIRACETAM 500 MG PO TABS: 500.0000 mg | ORAL_TABLET | Freq: Two times a day (BID) | ORAL | 1 refills | Status: DC

## 2024-07-10 ENCOUNTER — Ambulatory Visit: Admitting: Family Medicine

## 2024-07-10 VITALS — BP 115/67 | HR 79 | Wt 116.8 lb

## 2024-07-10 DIAGNOSIS — E119 Type 2 diabetes mellitus without complications: Secondary | ICD-10-CM

## 2024-07-10 DIAGNOSIS — R569 Unspecified convulsions: Secondary | ICD-10-CM | POA: Diagnosis not present

## 2024-07-10 DIAGNOSIS — Z23 Encounter for immunization: Secondary | ICD-10-CM | POA: Diagnosis not present

## 2024-07-10 LAB — POCT GLYCOSYLATED HEMOGLOBIN (HGB A1C): HbA1c, POC (controlled diabetic range): 6.4 % (ref 0.0–7.0)

## 2024-07-10 MED ORDER — LEVETIRACETAM 500 MG PO TABS: 500.0000 mg | ORAL_TABLET | Freq: Two times a day (BID) | ORAL | 1 refills | Status: AC

## 2024-07-10 MED ORDER — METFORMIN HCL 1000 MG PO TABS: 1000.0000 mg | ORAL_TABLET | Freq: Two times a day (BID) | ORAL | 1 refills | Status: AC

## 2024-07-10 NOTE — Assessment & Plan Note (Signed)
 A1c 6.4, at goal on current regimen.  -Referral to Ophthalmology for diabetic eye exam -Refill Metformin  1000mg  BID -Lipid panel, ACR -Repeat A1c in in 3-6 months

## 2024-07-10 NOTE — Patient Instructions (Addendum)
  Fue un placer verte hoy! [name] por elegir [facility].  Por favor, trae TODOS tus medicamentos a cada visita.  Hoy hablamos sobre:  1. Tu A1c es de 6.4, excelente trabajo! Por favor, contina tomando tus medicamentos segn lo recetado y termina la metformina que tienes. Tambin te refiero a Nurse, learning disability ocular para diabticos. Nuestro consultorio te dar seguimiento con esta informacin. Podemos hacer un seguimiento en 6 meses si tus niveles de azcar en sangre se mantienen bien controlados; de lo contrario, podemos hacer un seguimiento en 3 meses si tienes alguna inquietud y Psychologist, sport and exercise la A1c antes.  2. Hoy revisaremos tu colesterol para asegurarnos de que tu medicamento para el colesterol est funcionando. Me pondr en contacto contigo si necesitas ms tratamiento.  3. Resurt tu medicamento para las convulsiones; con gusto seguir resurtindolo a menos que tengas ms convulsiones; en ese caso, necesitas volver al neurlogo.  Por favor, haga un seguimiento en 3 a 6 meses.  Si an no lo ha hecho, Museum/gallery curator para acceder fcilmente a sus resultados de anlisis y comunicarse con su mdico de Information systems manager.  Hoy revisaremos [employer]. Si son anormales, Engineer, structural. Si son normales, Transport planner un mensaje de MyChart (si est Musician) o una carta por correo. Si no recibe noticias de sus [employer] prximas 2 semanas, llame a la oficina.  Llame a la clnica al [phone] si sus sntomas empeoran o tiene alguna inquietud.  Asegrese de programar una cita de seguimiento en la recepcin antes de irse hoy.  [name], DO [facility]   It was wonderful to see you today! Thank you for choosing [facility].   Please bring ALL of your medications with you to every visit.   Today we talked about:  Your A1c is 6.4, great work! Please continue to take your medication as prescribed and finish out the Metformin  you have. I also put in a  referral for a diabetic eye exam.  Our office will follow-up with you with this information.  We can follow-up in 6 months of your blood sugars remain well-controlled otherwise we can follow-up in 3 months if you have concerns and do your A1c sooner. We are checking your cholesterol today to ensure your cholesterol medication is working.  I will follow-up with you if further management is needed. I refilled your seizure medication, I am happy to continue refilling this unless you have further seizure activity in that case you need to go back to the neurologist.  Please follow up in 3-6 months   If you haven't already, sign up for My Chart to have easy access to your labs results, and communication with your primary care physician.   We are checking some labs today. If they are abnormal, I will call you. If they are normal, I will send you a MyChart message (if it is active) or a letter in the mail. If you do not hear about your labs in the next 2 weeks, please call the office.  Call the clinic at [phone] if your symptoms worsen or you have any concerns.  Please be sure to schedule follow up at the front desk before you leave today.   [name], DO Family Medicine

## 2024-07-10 NOTE — Progress Notes (Signed)
    SUBJECTIVE:   CHIEF COMPLAINT / HPI:   Hypertension: - Medications: Telmisartan  20 mg daily  - Compliance: Yes - Checking BP at home: Yes - husband keeps a log but it is not available.  - Denies any SOB, CP, vision changes, LE edema, medication SEs, or symptoms of hypotension  Diabetes Current Regimen: Jardiance  10mg  daily, Metformin  500mg  BID  CBGs: Yes - checking fasting BGL and husband writes it down but not available today Last A1c:  Lab Results  Component Value Date   HGBA1C 6.4 07/10/2024    Denies polyuria, polydipsia, hypoglycemia. Last Eye Exam: DUE - ordered today Statin: Rosuvastatin  5mg  daily ACE/ARB: Telmisartan  20mg  daily  Needs refill on Keppra  - no seizures. Dismissed from Neurology.  PERTINENT  PMH / PSH: HTN, T2DM, CVA, HLD, seizure disorder  OBJECTIVE:   BP 115/67   Pulse 79   Wt 116 lb 12.8 oz (53 kg)   SpO2 97%   BMI 22.07 kg/m   General: Alert, no apparent distress, well groomed HEENT: Normocephalic, atraumatic, moist mucus membranes, neck supple Respiratory: Normal respiratory effort GI: Non-distended Skin: No rashes, no jaundice Psych: Appropriate mood and affect Neuro: Limited movement of R side. Significant dysarthria.   ASSESSMENT/PLAN:   Assessment & Plan Type 2 diabetes mellitus without complication, without long-term current use of insulin  (HCC) A1c 6.4, at goal on current regimen.  -Referral to Ophthalmology for diabetic eye exam -Refill Metformin  1000mg  BID -Lipid panel, ACR -Repeat A1c in in 3-6 months Seizures (HCC) Well controlled on current regimen without recurrence. Will take over prescribing from Neurology. -Refill Keppra  500mg  BID Encounter for immunization Received flu shot   Dr. [name], DO [facility]

## 2024-07-11 LAB — MICROALBUMIN / CREATININE URINE RATIO
Creatinine, Urine: 26.3 mg/dL
Microalb/Creat Ratio: <11 mg/g{creat} (ref 0–29)
Microalbumin, Urine: <3 ug/mL

## 2024-07-11 LAB — LIPID PANEL
Chol/HDL Ratio: 2.3 ratio (ref 0.0–4.4)
Cholesterol, Total: 97 mg/dL — ABNORMAL LOW (ref 100–199)
HDL: 42 mg/dL (ref >39)
LDL Chol Calc (NIH): 35 mg/dL (ref 0–99)
Triglycerides: 110 mg/dL (ref 0–149)
VLDL Cholesterol Cal: 20 mg/dL (ref 5–40)

## 2024-07-13 ENCOUNTER — Ambulatory Visit: Payer: Self-pay | Admitting: Family Medicine

## 2024-08-11 ENCOUNTER — Other Ambulatory Visit

## 2024-09-14 ENCOUNTER — Other Ambulatory Visit: Payer: Self-pay | Admitting: *Deleted

## 2024-09-14 DIAGNOSIS — E119 Type 2 diabetes mellitus without complications: Secondary | ICD-10-CM

## 2024-09-14 DIAGNOSIS — I10 Essential (primary) hypertension: Secondary | ICD-10-CM

## 2024-09-14 MED ORDER — ROSUVASTATIN CALCIUM 5 MG PO TABS: 5.0000 mg | ORAL_TABLET | Freq: Every day | ORAL | 3 refills | Status: AC

## 2024-10-22 NOTE — Progress Notes (Signed)
" ° ° °  SUBJECTIVE:   CHIEF COMPLAINT / HPI:   Diabetes Current Regimen: Metformin  1000 mg twice daily CBGs: Checking fasting CBG - 130s Last A1c:  Lab Results  Component Value Date   HGBA1C 7.3 (A) 10/23/2024    Denies polyuria, polydipsia, hypoglycemia. Last Eye Exam: Referred at last visit - reports she has been in the last year.  Statin: Rosuvastatin  5mg  daily ACE/ARB: Telmisartan  20mg  daily  Hypertension: - Medications: Telmisartan  20mg  daily - Compliance: Yes - Checking BP at home: Yes - unsure of numbers but top number is below 140 - Denies any SOB, CP, vision changes, LE edema, medication SEs, or symptoms of hypotension   PERTINENT  PMH / PSH: HTN, T2DM, CVA, HLD, seizure disorder   OBJECTIVE:   BP 118/66   Pulse 73   Ht 5' 1 (1.549 m)   Wt 123 lb (55.8 kg)   SpO2 100%   BMI 23.24 kg/m    General: NAD, pleasant, able to participate in exam Cardiac: RRR, no murmurs. Respiratory: CTAB, normal effort, No wheezes, rales or rhonchi Extremities: no edema or cyanosis. Skin: warm and dry, no rashes noted Neuro: Limited speech, mostly communicates through grunting and gestures.  Right sided limited mobility secondary to prior CVA Psych: Normal affect and mood  ASSESSMENT/PLAN:   Assessment & Plan Type 2 diabetes mellitus without complication, with long-term current use of insulin  (HCC) A1c 7.3, increased from prior but continues to be at goal.  Continue metformin  1000 mg twice daily and advise lifestyle modifications for management. -Repeat A1c in 3 months Essential hypertension Well-controlled on current regimen. -Refill telmisartan  20 mg daily    *Declined interest in shingles vaccination   Dr. [name], DO [facility]     "

## 2024-10-23 ENCOUNTER — Ambulatory Visit: Payer: Self-pay | Admitting: Family Medicine

## 2024-10-23 ENCOUNTER — Encounter: Payer: Self-pay | Admitting: Family Medicine

## 2024-10-23 VITALS — BP 118/66 | HR 73 | Ht 61.0 in | Wt 123.0 lb

## 2024-10-23 DIAGNOSIS — E119 Type 2 diabetes mellitus without complications: Secondary | ICD-10-CM

## 2024-10-23 DIAGNOSIS — Z794 Long term (current) use of insulin: Secondary | ICD-10-CM | POA: Diagnosis not present

## 2024-10-23 DIAGNOSIS — I10 Essential (primary) hypertension: Secondary | ICD-10-CM | POA: Diagnosis not present

## 2024-10-23 LAB — POCT GLYCOSYLATED HEMOGLOBIN (HGB A1C): HbA1c, POC (controlled diabetic range): 7.3 % — AB (ref 0.0–7.0)

## 2024-10-23 MED ORDER — TELMISARTAN 20 MG PO TABS: 20.0000 mg | ORAL_TABLET | Freq: Every day | ORAL | 3 refills | Status: AC

## 2024-10-23 NOTE — Assessment & Plan Note (Signed)
 A1c 7.3, increased from prior but continues to be at goal.  Continue metformin  1000 mg twice daily and advise lifestyle modifications for management. -Repeat A1c in 3 months

## 2024-10-23 NOTE — Patient Instructions (Addendum)
¡  Fue un placer verte hoy! Amy [name] por elegir [facility].  Por favor, trae TODOS tus medicamentos a cada visita.  Hoy hablamos sobre:  1. Tu A1c es de 7.3; es un poco ms alta que antes, pero an est bien controlada. Te recomiendo que mejoremos tu dieta y tratemos de reducir la cantidad de alimentos y bebidas azucaradas que consumes. Tambin intenta hacer de 20 a 30 minutos de ejercicio, incluyendo caminar si es posible, a diario. Contina tomando metformina 1000 mg dos veces al da y repetiremos tu A1c en 3 meses para asegurarnos de que est bien controlada.  2. Tu presin arterial en general se ve bien hoy; el objetivo es que el valor superior sea inferior a 140. Contina controlndola en casa y, si se mantiene por encima de 140, vuelve a la consulta para que podamos ver si es necesario ajustar tu medicacin. Envi a la farmacia un resurtido de tu medicamento para la presin arterial para un ao.  Por favor, acuda a una cita de seguimiento en 3 meses.  Si an no lo ha hecho, regstrese en My Chart para acceder fcilmente a sus resultados de laboratorio y comunicarse con su mdico de cabecera.  Llame a la clnica al [phone] si sus sntomas empeoran o tiene alguna inquietud.  Asegrese de programar una cita de seguimiento en la recepcin antes de irse hoy.  Amy Nap, DO [facility]  It was wonderful to see you today! Thank you for choosing [facility].   Please bring ALL of your medications with you to every visit.   Today we talked about:  Your A1c is 7.3, this is a little higher than before but still well controlled. I recommend we work on your diet and try to reduce the amount of sugary foods and drinks you intake. Please also try to get 20-30 minutes of exercise including walking daily if possible.  Please also continue to take the metformin  1000 mg twice per day and we will repeat your A1c in 3 months to ensure it is  well-controlled. Your blood pressure overall looks good today, the goal would be for the top number to be less than 140.  Please continue to check at home and if it is consistently above 140 for the top number to return to the office so we can discuss if adjustments are needed to your medication.  I sent in a year supply refill of your blood pressure medication to the pharmacy.  Please follow up in 3 months   If you haven't already, sign up for My Chart to have easy access to your labs results, and communication with your primary care physician.  Call the clinic at [phone] if your symptoms worsen or you have any concerns.  Please be sure to schedule follow up at the front desk before you leave today.   Amy Nap, DO Family Medicine

## 2024-10-23 NOTE — Assessment & Plan Note (Signed)
 Well-controlled on current regimen. -Refill telmisartan  20 mg daily

## 2024-10-28 ENCOUNTER — Other Ambulatory Visit (HOSPITAL_BASED_OUTPATIENT_CLINIC_OR_DEPARTMENT_OTHER)
